# Patient Record
Sex: Male | Born: 1970 | Race: White | Hispanic: No | Marital: Married | Smoking: Current every day smoker
Health system: Southern US, Community
[De-identification: ages and names within clinical notes are randomized; demographics above are authoritative.]

## PROBLEM LIST (undated history)

## (undated) DIAGNOSIS — I10 Essential (primary) hypertension: Secondary | ICD-10-CM

## (undated) DIAGNOSIS — K219 Gastro-esophageal reflux disease without esophagitis: Secondary | ICD-10-CM

## (undated) DIAGNOSIS — R0600 Dyspnea, unspecified: Secondary | ICD-10-CM

## (undated) DIAGNOSIS — F419 Anxiety disorder, unspecified: Secondary | ICD-10-CM

## (undated) DIAGNOSIS — R519 Headache, unspecified: Secondary | ICD-10-CM

## (undated) DIAGNOSIS — N189 Chronic kidney disease, unspecified: Secondary | ICD-10-CM

## (undated) DIAGNOSIS — R51 Headache: Secondary | ICD-10-CM

## (undated) DIAGNOSIS — F17200 Nicotine dependence, unspecified, uncomplicated: Secondary | ICD-10-CM

## (undated) DIAGNOSIS — J45909 Unspecified asthma, uncomplicated: Secondary | ICD-10-CM

## (undated) DIAGNOSIS — I739 Peripheral vascular disease, unspecified: Secondary | ICD-10-CM

## (undated) DIAGNOSIS — E7800 Pure hypercholesterolemia, unspecified: Secondary | ICD-10-CM

## (undated) DIAGNOSIS — M1990 Unspecified osteoarthritis, unspecified site: Secondary | ICD-10-CM

## (undated) DIAGNOSIS — I82409 Acute embolism and thrombosis of unspecified deep veins of unspecified lower extremity: Secondary | ICD-10-CM

## (undated) HISTORY — PX: FRACTURE SURGERY: SHX138

---

## 2011-03-26 ENCOUNTER — Other Ambulatory Visit: Payer: Self-pay | Admitting: Urology

## 2011-03-26 DIAGNOSIS — Z87718 Personal history of other specified (corrected) congenital malformations of genitourinary system: Secondary | ICD-10-CM | POA: Insufficient documentation

## 2011-03-26 DIAGNOSIS — R350 Frequency of micturition: Secondary | ICD-10-CM | POA: Insufficient documentation

## 2011-03-26 DIAGNOSIS — Q613 Polycystic kidney, unspecified: Secondary | ICD-10-CM

## 2011-06-17 ENCOUNTER — Other Ambulatory Visit: Payer: Self-pay

## 2011-08-06 DIAGNOSIS — R300 Dysuria: Secondary | ICD-10-CM | POA: Insufficient documentation

## 2011-08-14 ENCOUNTER — Other Ambulatory Visit: Payer: Self-pay

## 2011-08-26 ENCOUNTER — Other Ambulatory Visit: Payer: Self-pay

## 2011-09-09 ENCOUNTER — Other Ambulatory Visit: Payer: Self-pay

## 2011-09-22 ENCOUNTER — Other Ambulatory Visit: Payer: Self-pay

## 2011-09-29 ENCOUNTER — Ambulatory Visit
Admission: RE | Admit: 2011-09-29 | Discharge: 2011-09-29 | Disposition: A | Discharge status: Home / Self Care | Payer: PRIVATE HEALTH INSURANCE | Source: Ambulatory Visit | Attending: Urology | Admitting: Urology

## 2011-09-29 DIAGNOSIS — Q613 Polycystic kidney, unspecified: Secondary | ICD-10-CM

## 2011-12-05 ENCOUNTER — Emergency Department: Payer: Self-pay | Admitting: Emergency Medicine

## 2012-04-29 ENCOUNTER — Other Ambulatory Visit: Payer: Self-pay | Admitting: Urology

## 2012-04-29 DIAGNOSIS — R208 Other disturbances of skin sensation: Secondary | ICD-10-CM

## 2012-04-29 DIAGNOSIS — Q613 Polycystic kidney, unspecified: Secondary | ICD-10-CM

## 2012-05-05 DIAGNOSIS — K219 Gastro-esophageal reflux disease without esophagitis: Secondary | ICD-10-CM | POA: Insufficient documentation

## 2012-05-11 ENCOUNTER — Other Ambulatory Visit: Payer: PRIVATE HEALTH INSURANCE

## 2012-05-21 ENCOUNTER — Other Ambulatory Visit: Payer: PRIVATE HEALTH INSURANCE

## 2012-05-28 ENCOUNTER — Other Ambulatory Visit: Payer: PRIVATE HEALTH INSURANCE

## 2012-06-18 ENCOUNTER — Inpatient Hospital Stay: Admission: RE | Admit: 2012-06-18 | Payer: PRIVATE HEALTH INSURANCE | Source: Ambulatory Visit

## 2012-06-25 ENCOUNTER — Ambulatory Visit
Admission: RE | Admit: 2012-06-25 | Discharge: 2012-06-25 | Disposition: A | Discharge status: Home / Self Care | Payer: No Typology Code available for payment source | Source: Ambulatory Visit | Attending: Urology | Admitting: Urology

## 2012-06-25 DIAGNOSIS — R208 Other disturbances of skin sensation: Secondary | ICD-10-CM

## 2012-06-25 DIAGNOSIS — Q613 Polycystic kidney, unspecified: Secondary | ICD-10-CM

## 2012-09-25 ENCOUNTER — Emergency Department: Payer: Self-pay | Admitting: Emergency Medicine

## 2012-11-01 DIAGNOSIS — N289 Disorder of kidney and ureter, unspecified: Secondary | ICD-10-CM | POA: Insufficient documentation

## 2012-11-01 DIAGNOSIS — R109 Unspecified abdominal pain: Secondary | ICD-10-CM | POA: Insufficient documentation

## 2012-11-01 DIAGNOSIS — Q613 Polycystic kidney, unspecified: Secondary | ICD-10-CM | POA: Insufficient documentation

## 2013-05-30 DIAGNOSIS — E785 Hyperlipidemia, unspecified: Secondary | ICD-10-CM | POA: Insufficient documentation

## 2013-06-06 DIAGNOSIS — M5416 Radiculopathy, lumbar region: Secondary | ICD-10-CM | POA: Insufficient documentation

## 2013-06-06 DIAGNOSIS — G8929 Other chronic pain: Secondary | ICD-10-CM | POA: Insufficient documentation

## 2013-07-20 ENCOUNTER — Emergency Department: Payer: Self-pay | Admitting: Emergency Medicine

## 2013-10-11 DIAGNOSIS — K4020 Bilateral inguinal hernia, without obstruction or gangrene, not specified as recurrent: Secondary | ICD-10-CM | POA: Insufficient documentation

## 2013-10-11 DIAGNOSIS — R339 Retention of urine, unspecified: Secondary | ICD-10-CM | POA: Insufficient documentation

## 2014-03-16 IMAGING — CT CT ABDOMEN W/O CM
3 of 4 series · 13 of 36 positions shown, 19 images · IV contrast (OMNI 300/WATER)
Comparison: Urinary tract ultrasound 09/29/2011.  No prior CT.

CLINICAL DATA: Increasing bilateral flank pain over the past 68
months in this patient with current history of polycystic kidney
disease.  Evaluate for possible hemorrhage into the cysts as the
cause for the pain.

CT ABDOMEN WITHOUT CONTRAST
TECHNIQUE: Multidetector CT imaging of the abdomen was performed
following the standard protocol without IV contrast.

[Series 3: abdomen w/o · axial · non-contrast · 0.82mm/px · z∈[-245,-20]mm · 6 of 64 slices shown, 11 images]
[im 10/64  soft-tissue]
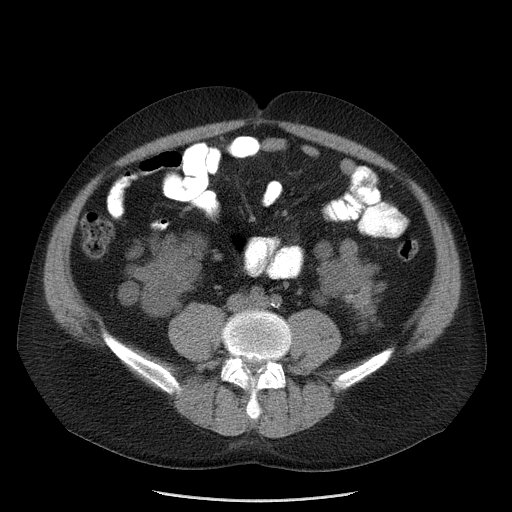
[im 10/64  bone]
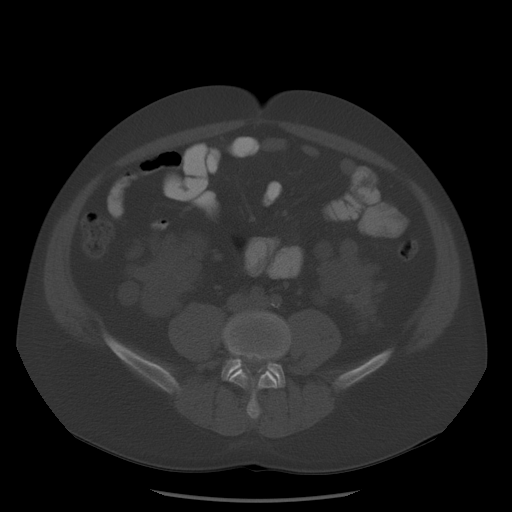
[im 19/64  soft-tissue]
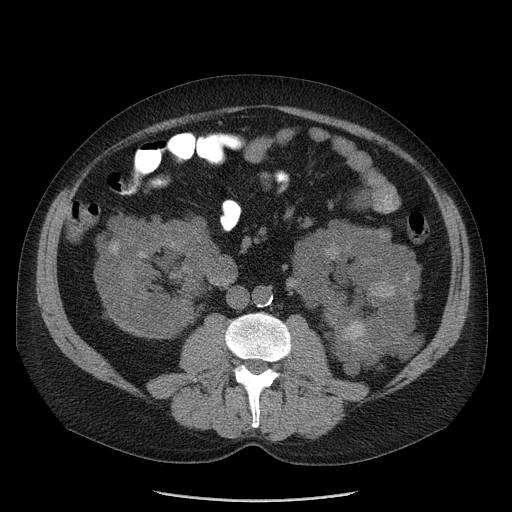
[im 28/64  soft-tissue]
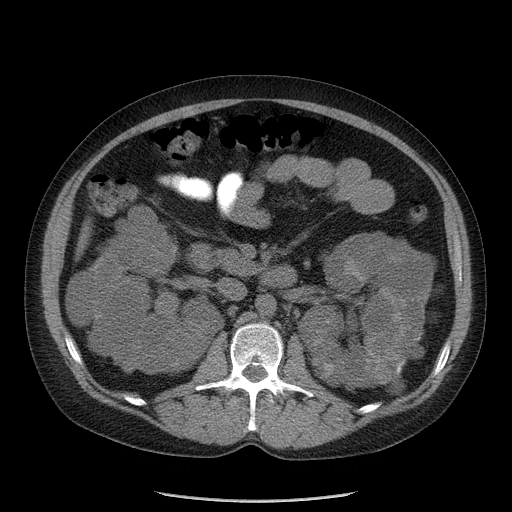
[im 28/64  lung]
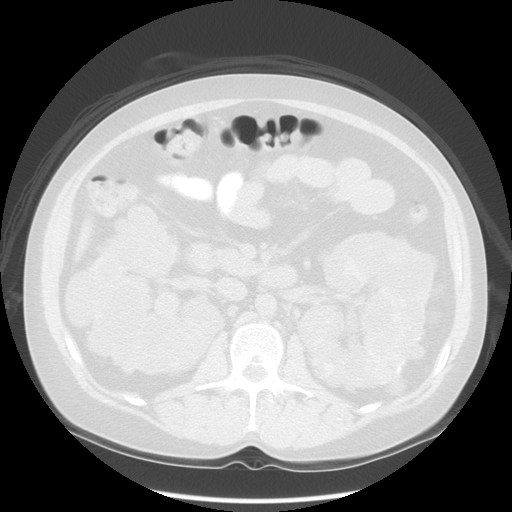
[im 37/64  soft-tissue]
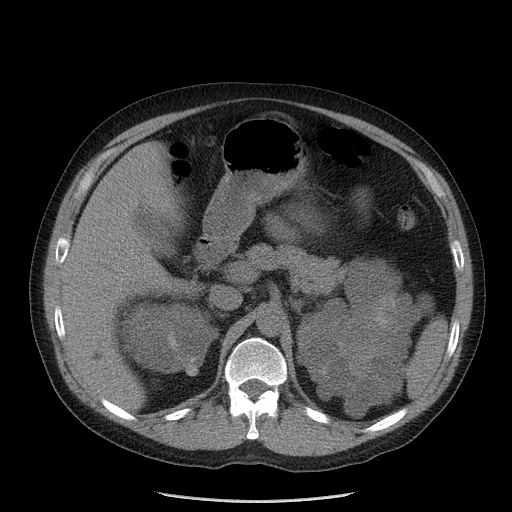
[im 37/64  lung]
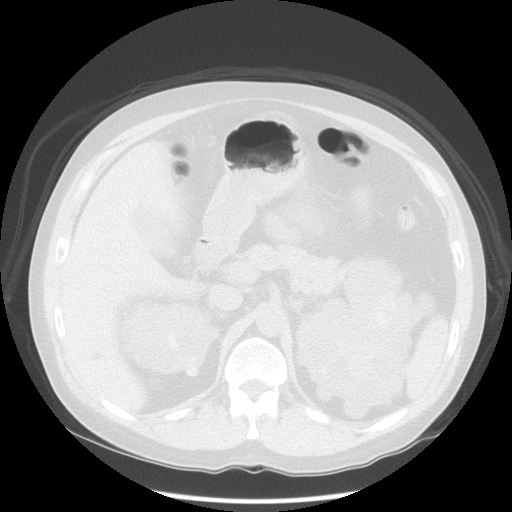
[im 46/64  soft-tissue]
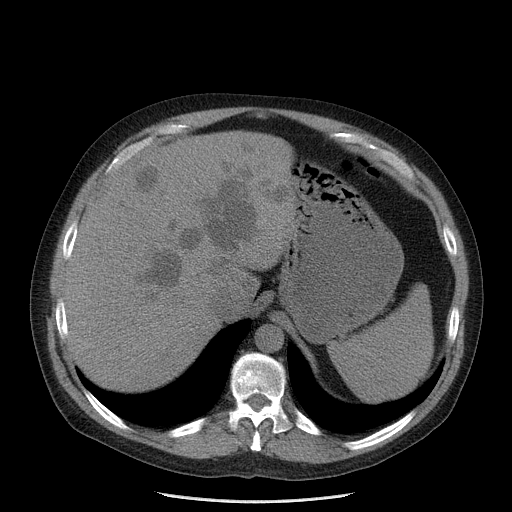
[im 46/64  lung]
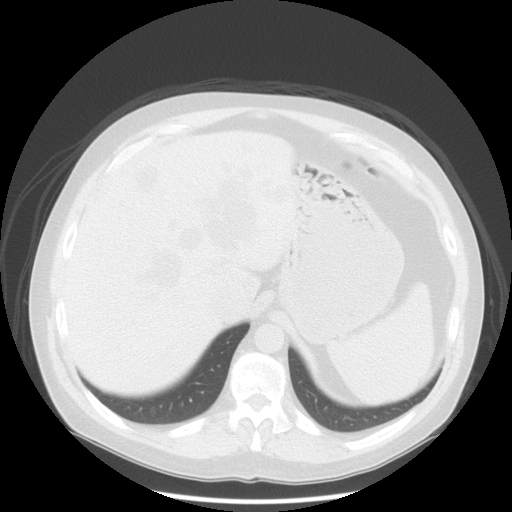
[im 55/64  soft-tissue]
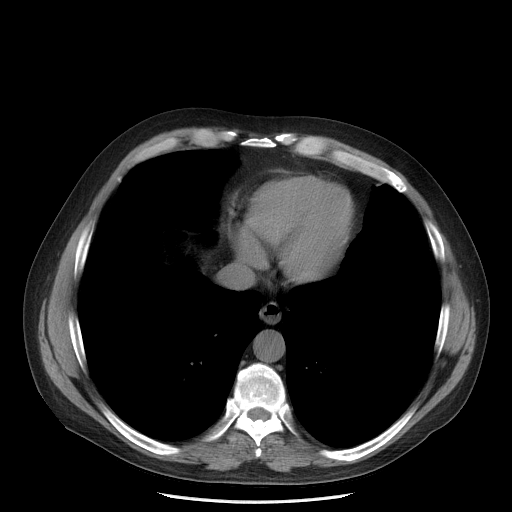
[im 55/64  lung]
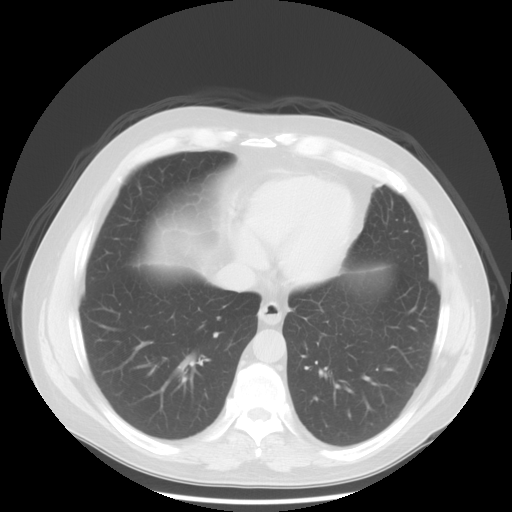

[Series 601: coronal body · coronal · 0.82mm/px · 1 of 138 slices shown, 2 images]
[im 46/138  soft-tissue]
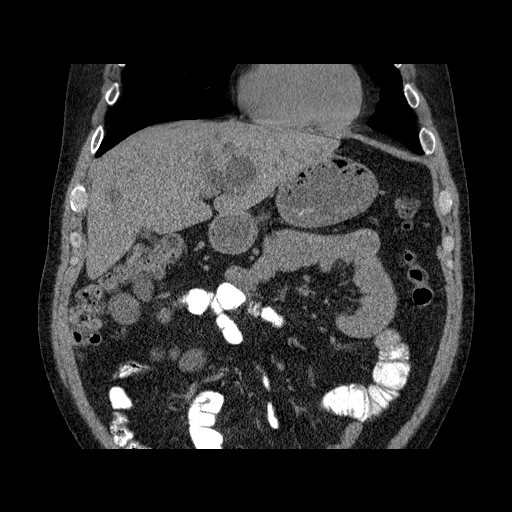
[im 46/138  bone]
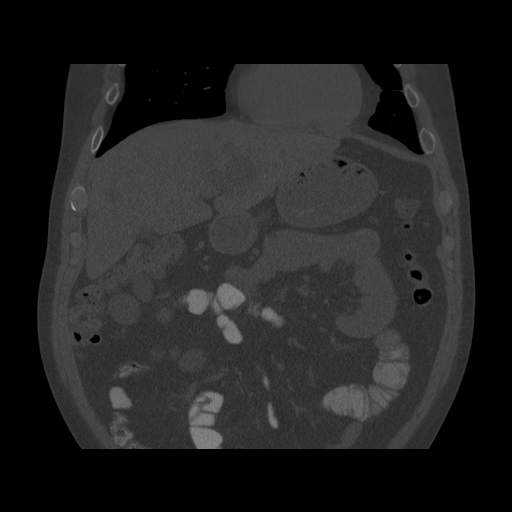

[Series 602: sagittal body · sagittal · 0.82mm/px · 6 of 161 slices shown]
[im 17/161  soft-tissue]
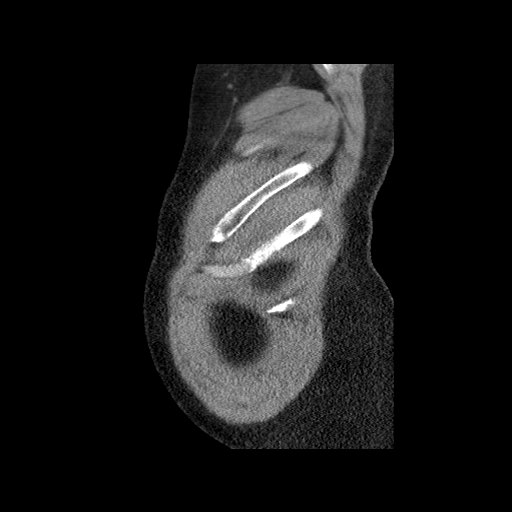
[im 34/161  soft-tissue]
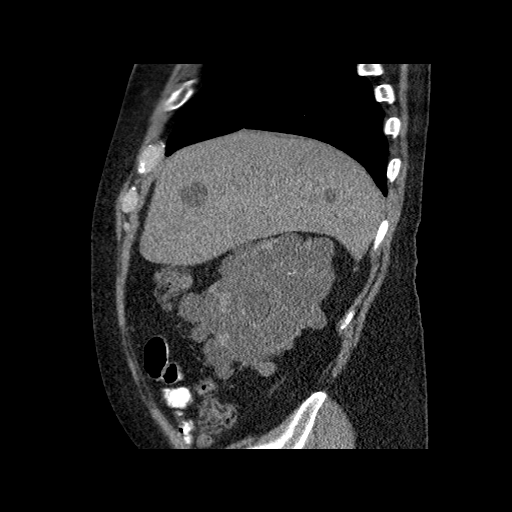
[im 51/161  soft-tissue]
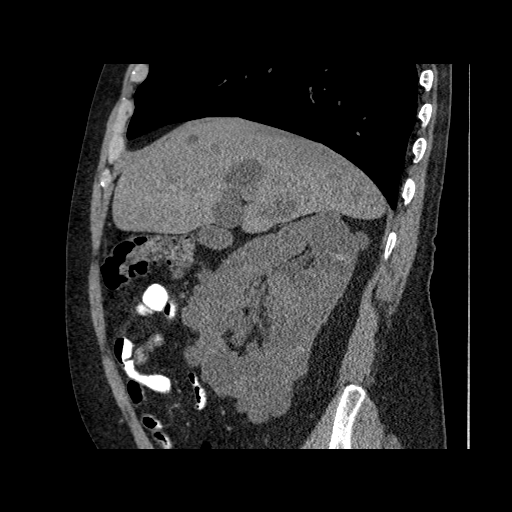
[im 68/161  soft-tissue]
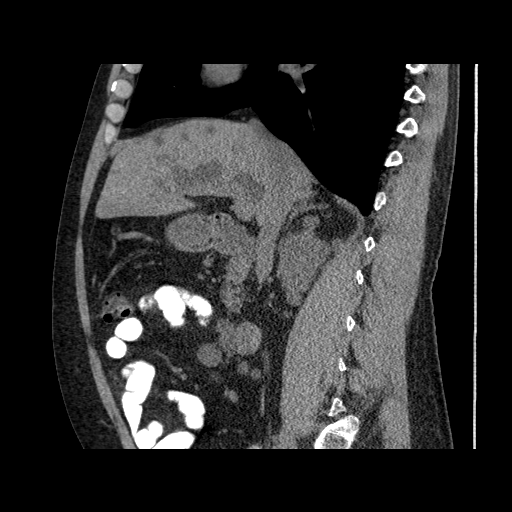
[im 93/161  soft-tissue]
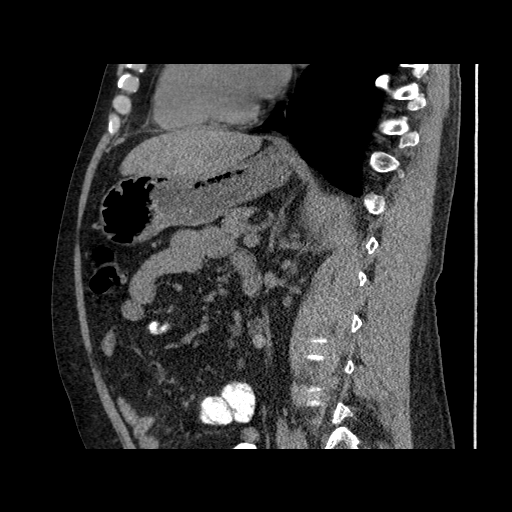
[im 110/161  soft-tissue]
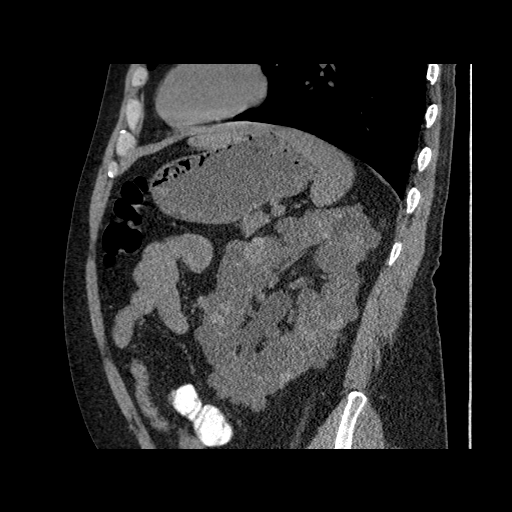

[13 of 36 positions shown; findings below may reference images not displayed]

FINDINGS: Innumerable cysts throughout both kidneys which are
markedly enlarged, consistent with the known history of polycystic
kidney disease.  Many of the cysts bilaterally are of increased
attenuation.  The largest high attenuation cyst is in the upper
pole of the left kidney and measures approximately 3 cm.  No
calculi identified within either kidney or either proximal ureter.

Numerous cysts are present throughout the liver; within the limits
of the unenhanced technique, no solid liver masses are identified.
Normal unenhanced appearance of the spleen, pancreas, adrenal
glands, and gallbladder.  No biliary ductal dilation.  Moderate
aorto-iliac atherosclerosis without aneurysm.  No significant
lymphadenopathy, though there are scattered normal-sized
retroperitoneal lymph nodes.

Stomach filled with food and fluid and normal in appearance.
Visualized colon and small bowel unremarkable.  No ascites.

Bone window images demonstrate minimal lower thoracic spondylosis.
Visualized lung bases clear.  Heart size normal.
IMPRESSION: 1.  Polycystic kidney disease.  Numerous proteinaceous and/or
hemorrhagic cysts are present in both kidneys, the largest
approximating 3 cm in the left upper pole.
2.  Numerous hepatic cysts.
3.  Aorto-iliac atherosclerosis which is advanced for age.

## 2014-05-15 DIAGNOSIS — I739 Peripheral vascular disease, unspecified: Secondary | ICD-10-CM | POA: Insufficient documentation

## 2014-05-31 DIAGNOSIS — M549 Dorsalgia, unspecified: Secondary | ICD-10-CM | POA: Insufficient documentation

## 2014-10-19 DIAGNOSIS — N185 Chronic kidney disease, stage 5: Secondary | ICD-10-CM | POA: Insufficient documentation

## 2015-07-26 HISTORY — PX: VASCULAR SURGERY: SHX849

## 2015-07-30 DIAGNOSIS — G894 Chronic pain syndrome: Secondary | ICD-10-CM | POA: Insufficient documentation

## 2015-10-24 DIAGNOSIS — Z01818 Encounter for other preprocedural examination: Secondary | ICD-10-CM | POA: Insufficient documentation

## 2015-11-06 ENCOUNTER — Other Ambulatory Visit: Payer: Self-pay | Admitting: Vascular Surgery

## 2015-11-16 ENCOUNTER — Other Ambulatory Visit: Payer: Self-pay

## 2015-11-20 ENCOUNTER — Encounter: Payer: Self-pay | Admitting: Anesthesiology

## 2015-11-20 ENCOUNTER — Inpatient Hospital Stay: Admission: RE | Admit: 2015-11-20 | Payer: Self-pay | Source: Ambulatory Visit

## 2015-11-21 ENCOUNTER — Ambulatory Visit: Admission: RE | Admit: 2015-11-21 | Payer: 59 | Source: Ambulatory Visit | Admitting: Vascular Surgery

## 2015-11-21 ENCOUNTER — Encounter: Admission: RE | Payer: Self-pay | Source: Ambulatory Visit

## 2015-11-21 SURGERY — ARTERIOVENOUS (AV) FISTULA CREATION
Anesthesia: General | Laterality: Right

## 2015-12-04 ENCOUNTER — Telehealth (INDEPENDENT_AMBULATORY_CARE_PROVIDER_SITE_OTHER): Payer: Self-pay

## 2015-12-04 ENCOUNTER — Encounter (INDEPENDENT_AMBULATORY_CARE_PROVIDER_SITE_OTHER): Payer: Self-pay

## 2015-12-04 NOTE — Telephone Encounter (Signed)
Called patient to let him know I had rescheduled him from 12/21/15 to 12/26/15 due to Dr. Delana Meyer being out of town on 12/21/15, patient was okay with the change.

## 2015-12-11 ENCOUNTER — Other Ambulatory Visit (INDEPENDENT_AMBULATORY_CARE_PROVIDER_SITE_OTHER): Payer: Self-pay | Admitting: Vascular Surgery

## 2015-12-11 ENCOUNTER — Ambulatory Visit (INDEPENDENT_AMBULATORY_CARE_PROVIDER_SITE_OTHER): Payer: 59 | Admitting: Vascular Surgery

## 2015-12-11 ENCOUNTER — Encounter (INDEPENDENT_AMBULATORY_CARE_PROVIDER_SITE_OTHER): Payer: Self-pay | Admitting: Vascular Surgery

## 2015-12-11 ENCOUNTER — Encounter
Admission: RE | Admit: 2015-12-11 | Discharge: 2015-12-11 | Disposition: A | Discharge status: Home / Self Care | Payer: 59 | Source: Ambulatory Visit | Attending: Vascular Surgery | Admitting: Vascular Surgery

## 2015-12-11 VITALS — BP 147/98 | HR 91 | Resp 17 | Ht 71.0 in | Wt 220.0 lb

## 2015-12-11 DIAGNOSIS — I10 Essential (primary) hypertension: Secondary | ICD-10-CM | POA: Diagnosis not present

## 2015-12-11 DIAGNOSIS — I120 Hypertensive chronic kidney disease with stage 5 chronic kidney disease or end stage renal disease: Secondary | ICD-10-CM | POA: Insufficient documentation

## 2015-12-11 DIAGNOSIS — N185 Chronic kidney disease, stage 5: Secondary | ICD-10-CM | POA: Diagnosis not present

## 2015-12-11 DIAGNOSIS — N186 End stage renal disease: Secondary | ICD-10-CM | POA: Diagnosis not present

## 2015-12-11 DIAGNOSIS — F17200 Nicotine dependence, unspecified, uncomplicated: Secondary | ICD-10-CM | POA: Insufficient documentation

## 2015-12-11 DIAGNOSIS — Z01818 Encounter for other preprocedural examination: Secondary | ICD-10-CM | POA: Insufficient documentation

## 2015-12-11 HISTORY — DX: Headache, unspecified: R51.9

## 2015-12-11 HISTORY — DX: Anxiety disorder, unspecified: F41.9

## 2015-12-11 HISTORY — DX: Gastro-esophageal reflux disease without esophagitis: K21.9

## 2015-12-11 HISTORY — DX: Peripheral vascular disease, unspecified: I73.9

## 2015-12-11 HISTORY — DX: Unspecified osteoarthritis, unspecified site: M19.90

## 2015-12-11 HISTORY — DX: Chronic kidney disease, unspecified: N18.9

## 2015-12-11 HISTORY — DX: Pure hypercholesterolemia, unspecified: E78.00

## 2015-12-11 HISTORY — DX: Unspecified asthma, uncomplicated: J45.909

## 2015-12-11 HISTORY — DX: Nicotine dependence, unspecified, uncomplicated: F17.200

## 2015-12-11 HISTORY — DX: Essential (primary) hypertension: I10

## 2015-12-11 HISTORY — DX: Headache: R51

## 2015-12-11 HISTORY — DX: Acute embolism and thrombosis of unspecified deep veins of unspecified lower extremity: I82.409

## 2015-12-11 HISTORY — DX: Dyspnea, unspecified: R06.00

## 2015-12-11 LAB — SURGICAL PCR SCREEN
MRSA, PCR: NEGATIVE
Staphylococcus aureus: NEGATIVE

## 2015-12-11 LAB — BASIC METABOLIC PANEL
ANION GAP: 6 (ref 5–15)
BUN: 39 mg/dL — ABNORMAL HIGH (ref 6–20)
CHLORIDE: 113 mmol/L — AB (ref 101–111)
CO2: 20 mmol/L — ABNORMAL LOW (ref 22–32)
Calcium: 8.4 mg/dL — ABNORMAL LOW (ref 8.9–10.3)
Creatinine, Ser: 4.32 mg/dL — ABNORMAL HIGH (ref 0.61–1.24)
GFR calc Af Amer: 17 mL/min — ABNORMAL LOW (ref >60)
GFR, EST NON AFRICAN AMERICAN: 15 mL/min — AB (ref >60)
GLUCOSE: 103 mg/dL — AB (ref 65–99)
POTASSIUM: 5.3 mmol/L — AB (ref 3.5–5.1)
Sodium: 139 mmol/L (ref 135–145)

## 2015-12-11 LAB — CBC WITH DIFFERENTIAL/PLATELET
BASOS ABS: 0 10*3/uL (ref 0–0.1)
Basophils Relative: 1 %
Eosinophils Absolute: 0.1 10*3/uL (ref 0–0.7)
Eosinophils Relative: 2 %
HEMATOCRIT: 32.2 % — AB (ref 40.0–52.0)
Hemoglobin: 11 g/dL — ABNORMAL LOW (ref 13.0–18.0)
LYMPHS ABS: 1.3 10*3/uL (ref 1.0–3.6)
LYMPHS PCT: 25 %
MCH: 30.5 pg (ref 26.0–34.0)
MCHC: 34.1 g/dL (ref 32.0–36.0)
MCV: 89.7 fL (ref 80.0–100.0)
MONO ABS: 0.3 10*3/uL (ref 0.2–1.0)
Monocytes Relative: 6 %
NEUTROS ABS: 3.5 10*3/uL (ref 1.4–6.5)
Neutrophils Relative %: 66 %
Platelets: 196 10*3/uL (ref 150–440)
RBC: 3.6 MIL/uL — AB (ref 4.40–5.90)
RDW: 13.3 % (ref 11.5–14.5)
WBC: 5.2 10*3/uL (ref 3.8–10.6)

## 2015-12-11 LAB — TYPE AND SCREEN
ABO/RH(D): O POS
ANTIBODY SCREEN: NEGATIVE

## 2015-12-11 LAB — APTT: APTT: 27 s (ref 24–36)

## 2015-12-11 LAB — PROTIME-INR
INR: 1.06
Prothrombin Time: 13.8 seconds (ref 11.4–15.2)

## 2015-12-11 NOTE — Patient Instructions (Addendum)
  Your procedure is scheduled [id number] 1, 2017 (Wednesday) Report to [facility] Day Surgery 2nd floor [facility] To find out your arrival time please call [phone] between 1PM - 3PM on December 25, 2015(Tuesday Remember: Instructions that are not followed completely may result in serious medical risk, up to and including death, or upon the discretion of your surgeon and anesthesiologist your surgery may need to be rescheduled.    _x___ 1. Do not eat food or drink liquids after midnight. No gum chewing or hard candies.     _x_   __ 2. No Alcohol for 24 hours before or after surgery.   __x__3. No Smoking for 24 prior to surgery.   ____  4. Bring all medications with you on the day of surgery if instructed.    __x__ 5. Notify your doctor if there is any change in your medical condition     (cold, fever, infections).     Do not wear jewelry, make-up, hairpins, clips or nail polish.  Do not wear lotions, powders, or perfumes. You may wear deodorant.  Do not shave 48 hours prior to surgery. Men may shave face and neck.  Do not bring valuables to the hospital.    [facility] is not responsible for any belongings or valuables.               Contacts, dentures or bridgework may not be worn into surgery.  Leave your suitcase in the car. After surgery it may be brought to your room.  For patients admitted to the hospital, discharge time is determined by your treatment team.   Patients discharged the day of surgery will not be allowed to drive home.    Please read over the following fact sheets that you were given:   [facility] Preparing for Surgery and or MRSA Information   _x___ Take these medicines the morning of surgery with A SIP OF WATER:    1. Amlodipine   2. Lisinopril.  3. Omeprazole (Omeprazole at bedtime on October 31)    ____Fleets enema or Magnesium Citrate as directed.   _x___ Use CHG Soap or sage wipes as directed on instruction sheet   ____ Use inhalers on the day  of surgery and bring to hospital day of surgery  ____ Stop metformin 2 days prior to surgery    ____ Take 1/2 of usual insulin dose the night before surgery and none on the morning of           surgery.    _X__ Stop aspirin or coumadin, or plavix (Continue Plavix and Aspirin, but do not take the morning of surgery )  x__ Stop Anti-inflammatories such as Advil, Aleve, Ibuprofen, Motrin, Naproxen,          Naprosyn, Goodies powders or aspirin products. Ok to take Tylenol.   ____ Stop supplements until after surgery.    ____ Bring C-Pap to the hospital.

## 2015-12-11 NOTE — Progress Notes (Signed)
Subjective:    Patient ID: Oscar Moody., male    DOB: September 07, 1970, 45 y.o.   MRN: [id number] Chief Complaint  Patient presents with  . Re-evaluation    Update H and P    Patient presents for an H&P update. He is scheduled for a right radiocephalic AV fistula creation. He denies any new health issues or changes in his medications. He denies any recent sicknesses. He denies any chest pain, SOB, abdominal pain or changes in urinary or bowel habits.    Review of Systems  Constitutional: Negative.   HENT: Negative.   Eyes: Negative.   Respiratory: Negative.   Cardiovascular: Negative.   Gastrointestinal: Negative.   Endocrine: Negative.   Genitourinary:       Chronic Kidney Disease  Musculoskeletal: Negative.   Skin: Negative.   Allergic/Immunologic: Negative.   Neurological: Negative.   Hematological: Negative.   Psychiatric/Behavioral: Negative.       Objective:   Physical Exam  Constitutional: He is oriented to person, place, and time. He appears well-developed and well-nourished.  HENT:  Head: Normocephalic and atraumatic.  Eyes: Conjunctivae and EOM are normal. Pupils are equal, round, and reactive to light.  Neck: Normal range of motion.  Cardiovascular: Normal rate, regular rhythm, normal heart sounds and intact distal pulses.   Pulses:      Dorsalis pedis pulses are 2+ on the right side, and 2+ on the left side.       Posterior tibial pulses are 2+ on the right side, and 2+ on the left side.  Pulmonary/Chest: Effort normal and breath sounds normal. No respiratory distress. He has no wheezes. He has no rales.  Abdominal: Soft. Bowel sounds are normal. He exhibits no distension. There is no tenderness. There is no rebound and no guarding.  Musculoskeletal: Normal range of motion. He exhibits no edema.  Neurological: He is alert and oriented to person, place, and time.  Skin: Skin is warm and dry.  Psychiatric: He has a normal mood and affect. His behavior is  normal. Judgment and thought content normal.   BP (!) 147/98 (BP Location: Right Arm)   Pulse 91   Resp 17   Ht 5\' 11"  (1.803 m)   Wt 220 lb (99.8 kg)   BMI 30.68 kg/m   Past Medical History:  Diagnosis Date  . Anxiety   . Arthritis   . Asthma    childhood asthma  . Chronic kidney disease   . Current every day smoker   . DVT (deep venous thrombosis) ([place])   . Dyspnea    with exertion  . GERD (gastroesophageal reflux disease)   . Headache   . Hypercholesteremia   . Hypertension   . Peripheral vascular disease [facility])    Social History   Social History  . Marital status: Single    Spouse name: N/A  . Number of children: N/A  . Years of education: N/A   Occupational History  . Not on file.   Social History Main Topics  . Smoking status: Current Every Day Smoker    Packs/day: 0.25    Types: Cigarettes  . Smokeless tobacco: Never Used  . Alcohol use No  . Drug use:     Types: Marijuana     Comment: occassional (when out of pain medication)  . Sexual activity: Not on file   Other Topics Concern  . Not on file   Social History Narrative  . No narrative on file   Past Surgical  History:  Procedure Laterality Date  . FRACTURE SURGERY Left    screw in left wrist  . VASCULAR SURGERY Left 07/2015   stent in left thigh, Location:[place]   Family History  Problem Relation Age of Onset  . Hypertension Mother   . Hypertension Sister    No Known Allergies     Assessment & Plan:  Patient presents for an H&P update. He is scheduled for a right radiocephalic AV fistula creation. He denies any new health issues or changes in his medications. He denies any recent sicknesses. He denies any chest pain, SOB, abdominal pain or changes in urinary or bowel habits.   1. Chronic kidney disease, stage V ([place]) - Stable He is scheduled for a right radiocephalic AV fistula creation.  2. Essential hypertension - Stable Encouraged good control as its slows the progression of  atherosclerotic disease  3. Tobacco dependence - Stable I have discussed with the patient the role of tobacco in the pathogenesis of atherosclerosis and its effect on the progression of the disease, impact on the durability of interventions and its limitations on the formation of collateral pathways. I have recommended absolute tobacco cessation. I have discussed various options available for assistance with tobacco cessation including over the counter methods (Nicotine gum, patch and lozenges). We also discussed prescription options (Chantix, Nicotine Inhaler / Nasal Spray). The patient is not interested in pursuing any prescription tobacco cessation options at this time. The patient voices their understanding.   Current Outpatient Prescriptions on File Prior to Visit  Medication Sig Dispense Refill  . amLODipine (NORVASC) 5 MG tablet Take 5 mg by mouth daily.    Marland Kitchen aspirin EC 81 MG tablet Take 81 mg by mouth daily.    . clopidogrel (PLAVIX) 75 MG tablet Take 75 mg by mouth daily.    Marland Kitchen lisinopril (PRINIVIL,ZESTRIL) 20 MG tablet Take 20 mg by mouth daily.    Marland Kitchen omeprazole (PRILOSEC) 40 MG capsule Take 40 mg by mouth daily.     No current facility-administered medications on file prior to visit.     There are no Patient Instructions on file for this visit. No Follow-up on file.   Abri Vacca A Willi Borowiak, PA-C

## 2015-12-26 ENCOUNTER — Encounter: Payer: Self-pay | Admitting: *Deleted

## 2015-12-26 ENCOUNTER — Ambulatory Visit: Payer: 59 | Admitting: Anesthesiology

## 2015-12-26 ENCOUNTER — Encounter
Admission: RE | Disposition: A | Discharge status: Home / Self Care | Payer: Self-pay | Source: Ambulatory Visit | Attending: Vascular Surgery

## 2015-12-26 ENCOUNTER — Ambulatory Visit
Admission: RE | Admit: 2015-12-26 | Discharge: 2015-12-26 | Disposition: A | Discharge status: Home / Self Care | Payer: 59 | Source: Ambulatory Visit | Attending: Vascular Surgery | Admitting: Vascular Surgery

## 2015-12-26 DIAGNOSIS — J449 Chronic obstructive pulmonary disease, unspecified: Secondary | ICD-10-CM | POA: Insufficient documentation

## 2015-12-26 DIAGNOSIS — E7800 Pure hypercholesterolemia, unspecified: Secondary | ICD-10-CM | POA: Diagnosis not present

## 2015-12-26 DIAGNOSIS — F419 Anxiety disorder, unspecified: Secondary | ICD-10-CM | POA: Diagnosis not present

## 2015-12-26 DIAGNOSIS — I739 Peripheral vascular disease, unspecified: Secondary | ICD-10-CM | POA: Diagnosis not present

## 2015-12-26 DIAGNOSIS — F17210 Nicotine dependence, cigarettes, uncomplicated: Secondary | ICD-10-CM | POA: Diagnosis not present

## 2015-12-26 DIAGNOSIS — N186 End stage renal disease: Secondary | ICD-10-CM | POA: Diagnosis not present

## 2015-12-26 DIAGNOSIS — Z6830 Body mass index (BMI) 30.0-30.9, adult: Secondary | ICD-10-CM | POA: Insufficient documentation

## 2015-12-26 DIAGNOSIS — I120 Hypertensive chronic kidney disease with stage 5 chronic kidney disease or end stage renal disease: Secondary | ICD-10-CM | POA: Diagnosis present

## 2015-12-26 DIAGNOSIS — Z7902 Long term (current) use of antithrombotics/antiplatelets: Secondary | ICD-10-CM | POA: Insufficient documentation

## 2015-12-26 DIAGNOSIS — Z7982 Long term (current) use of aspirin: Secondary | ICD-10-CM | POA: Diagnosis not present

## 2015-12-26 DIAGNOSIS — M1990 Unspecified osteoarthritis, unspecified site: Secondary | ICD-10-CM | POA: Insufficient documentation

## 2015-12-26 DIAGNOSIS — K219 Gastro-esophageal reflux disease without esophagitis: Secondary | ICD-10-CM | POA: Diagnosis not present

## 2015-12-26 DIAGNOSIS — Z86718 Personal history of other venous thrombosis and embolism: Secondary | ICD-10-CM | POA: Diagnosis not present

## 2015-12-26 HISTORY — PX: AV FISTULA PLACEMENT: SHX1204

## 2015-12-26 LAB — POCT I-STAT 4, (NA,K, GLUC, HGB,HCT)
GLUCOSE: 96 mg/dL (ref 65–99)
HEMATOCRIT: 35 % — AB (ref 39.0–52.0)
Hemoglobin: 11.9 g/dL — ABNORMAL LOW (ref 13.0–17.0)
POTASSIUM: 4.5 mmol/L (ref 3.5–5.1)
Sodium: 143 mmol/L (ref 135–145)

## 2015-12-26 LAB — TYPE AND SCREEN
ABO/RH(D): O POS
ANTIBODY SCREEN: NEGATIVE

## 2015-12-26 LAB — URINE DRUG SCREEN, QUALITATIVE (ARMC ONLY)
AMPHETAMINES, UR SCREEN: NOT DETECTED
BENZODIAZEPINE, UR SCRN: NOT DETECTED
Barbiturates, Ur Screen: NOT DETECTED
Cannabinoid 50 Ng, Ur ~~LOC~~: NOT DETECTED
Cocaine Metabolite,Ur ~~LOC~~: NOT DETECTED
MDMA (ECSTASY) UR SCREEN: NOT DETECTED
METHADONE SCREEN, URINE: NOT DETECTED
Opiate, Ur Screen: NOT DETECTED
PHENCYCLIDINE (PCP) UR S: NOT DETECTED
TRICYCLIC, UR SCREEN: NOT DETECTED

## 2015-12-26 SURGERY — ARTERIOVENOUS (AV) FISTULA CREATION
Anesthesia: General | Laterality: Right

## 2015-12-26 MED ORDER — ONDANSETRON HCL 4 MG/2ML IJ SOLN
INTRAMUSCULAR | Status: DC | PRN
  Administered 2015-12-26: 4 mg via INTRAVENOUS

## 2015-12-26 MED ORDER — HYDROCODONE-ACETAMINOPHEN 5-325 MG PO TABS: 1.0000 | ORAL_TABLET | Freq: Four times a day (QID) | ORAL | 0 refills | Status: DC | PRN

## 2015-12-26 MED ORDER — SODIUM CHLORIDE 0.9 % IV SOLN
INTRAVENOUS | Status: DC
  Administered 2015-12-26: 13:00:00 via INTRAVENOUS

## 2015-12-26 MED ORDER — PHENYLEPHRINE HCL 10 MG/ML IJ SOLN
INTRAMUSCULAR | Status: DC | PRN
  Administered 2015-12-26 (×2): 100 ug via INTRAVENOUS
  Administered 2015-12-26 (×2): 200 ug via INTRAVENOUS
  Administered 2015-12-26 (×2): 100 ug via INTRAVENOUS
  Administered 2015-12-26 (×5): 200 ug via INTRAVENOUS

## 2015-12-26 MED ORDER — PAPAVERINE HCL 30 MG/ML IJ SOLN
INTRAMUSCULAR | Status: AC
  Filled 2015-12-26: qty 2

## 2015-12-26 MED ORDER — FENTANYL CITRATE (PF) 100 MCG/2ML IJ SOLN
INTRAMUSCULAR | Status: DC | PRN
  Administered 2015-12-26 (×2): 50 ug via INTRAVENOUS
  Administered 2015-12-26: 25 ug via INTRAVENOUS
  Administered 2015-12-26: 50 ug via INTRAVENOUS
  Administered 2015-12-26: 25 ug via INTRAVENOUS

## 2015-12-26 MED ORDER — PROPOFOL 10 MG/ML IV BOLUS
INTRAVENOUS | Status: DC | PRN
  Administered 2015-12-26: 200 mg via INTRAVENOUS

## 2015-12-26 MED ORDER — CEFAZOLIN SODIUM-DEXTROSE 2-4 GM/100ML-% IV SOLN
INTRAVENOUS | Status: AC
  Administered 2015-12-26: 2 g via INTRAVENOUS
  Filled 2015-12-26: qty 100

## 2015-12-26 MED ORDER — CHLORHEXIDINE GLUCONATE CLOTH 2 % EX PADS: 6.0000 | MEDICATED_PAD | Freq: Once | CUTANEOUS | Status: DC

## 2015-12-26 MED ORDER — GLYCOPYRROLATE 0.2 MG/ML IJ SOLN
INTRAMUSCULAR | Status: DC | PRN
  Administered 2015-12-26: 0.2 mg via INTRAVENOUS

## 2015-12-26 MED ORDER — HEPARIN SODIUM (PORCINE) 5000 UNIT/ML IJ SOLN
INTRAMUSCULAR | Status: AC
  Filled 2015-12-26: qty 1

## 2015-12-26 MED ORDER — BUPIVACAINE HCL (PF) 0.5 % IJ SOLN
INTRAMUSCULAR | Status: DC | PRN
  Administered 2015-12-26: 4 mL

## 2015-12-26 MED ORDER — LIDOCAINE HCL (CARDIAC) 20 MG/ML IV SOLN
INTRAVENOUS | Status: DC | PRN
  Administered 2015-12-26: 100 mg via INTRAVENOUS

## 2015-12-26 MED ORDER — SODIUM CHLORIDE 0.9 % IV SOLN
INTRAVENOUS | Status: DC | PRN
  Administered 2015-12-26: 20 mL via INTRAMUSCULAR

## 2015-12-26 MED ORDER — FENTANYL CITRATE (PF) 100 MCG/2ML IJ SOLN
25.0000 ug | INTRAMUSCULAR | Status: DC | PRN
  Administered 2015-12-26 (×4): 25 ug via INTRAVENOUS

## 2015-12-26 MED ORDER — MIDAZOLAM HCL 2 MG/2ML IJ SOLN
INTRAMUSCULAR | Status: DC | PRN
  Administered 2015-12-26: 2 mg via INTRAVENOUS

## 2015-12-26 MED ORDER — FENTANYL CITRATE (PF) 100 MCG/2ML IJ SOLN
INTRAMUSCULAR | Status: AC
  Administered 2015-12-26: 25 ug via INTRAVENOUS
  Filled 2015-12-26: qty 2

## 2015-12-26 MED ORDER — CEFAZOLIN SODIUM-DEXTROSE 2-4 GM/100ML-% IV SOLN
2.0000 g | INTRAVENOUS | Status: AC
  Administered 2015-12-26: 2 g via INTRAVENOUS

## 2015-12-26 MED ORDER — BUPIVACAINE HCL (PF) 0.5 % IJ SOLN
INTRAMUSCULAR | Status: AC
  Filled 2015-12-26: qty 30

## 2015-12-26 MED ORDER — ONDANSETRON HCL 4 MG/2ML IJ SOLN: 4.0000 mg | Freq: Once | INTRAMUSCULAR | Status: DC | PRN

## 2015-12-26 SURGICAL SUPPLY — 58 items
APPLIER CLIP 11 MED OPEN (CLIP)
APPLIER CLIP 9.375 SM OPEN (CLIP)
BAG DECANTER FOR FLEXI CONT (MISCELLANEOUS) ×3 IMPLANT
BLADE CLIPPER SURG (BLADE) ×3 IMPLANT
BLADE SURG SZ11 CARB STEEL (BLADE) ×3 IMPLANT
BOOT SUTURE AID YELLOW STND (SUTURE) ×3 IMPLANT
BRUSH SCRUB 4% CHG (MISCELLANEOUS) ×3 IMPLANT
CANISTER SUCT 1200ML W/VALVE (MISCELLANEOUS) ×3 IMPLANT
CHLORAPREP W/TINT 26ML (MISCELLANEOUS) ×6 IMPLANT
CLIP APPLIE 11 MED OPEN (CLIP) IMPLANT
CLIP APPLIE 9.375 SM OPEN (CLIP) IMPLANT
DRESSING SURGICEL FIBRLLR 1X2 (HEMOSTASIS) ×1 IMPLANT
DRSG SURGICEL FIBRILLAR 1X2 (HEMOSTASIS) ×3
ELECT CAUTERY BLADE 6.4 (BLADE) ×3 IMPLANT
ELECT REM PT RETURN 9FT ADLT (ELECTROSURGICAL) ×3
ELECTRODE REM PT RTRN 9FT ADLT (ELECTROSURGICAL) ×1 IMPLANT
GEL ULTRASOUND 20GR AQUASONIC (MISCELLANEOUS) IMPLANT
GLOVE BIO SURGEON STRL SZ7 (GLOVE) ×3 IMPLANT
GLOVE BIOGEL PI IND STRL 7.0 (GLOVE) ×1 IMPLANT
GLOVE BIOGEL PI INDICATOR 7.0 (GLOVE) ×2
GLOVE INDICATOR 7.5 STRL GRN (GLOVE) IMPLANT
GLOVE SURG SYN 8.0 (GLOVE) ×3 IMPLANT
GOWN STRL REUS W/ TWL LRG LVL3 (GOWN DISPOSABLE) ×2 IMPLANT
GOWN STRL REUS W/ TWL XL LVL3 (GOWN DISPOSABLE) ×1 IMPLANT
GOWN STRL REUS W/TWL LRG LVL3 (GOWN DISPOSABLE) ×4
GOWN STRL REUS W/TWL MED LVL3 (GOWN DISPOSABLE) ×3 IMPLANT
GOWN STRL REUS W/TWL XL LVL3 (GOWN DISPOSABLE) ×2
IV NS 500ML (IV SOLUTION) ×2
IV NS 500ML BAXH (IV SOLUTION) ×1 IMPLANT
KIT RM TURNOVER STRD PROC AR (KITS) ×3 IMPLANT
LABEL OR SOLS (LABEL) ×3 IMPLANT
LIQUID BAND (GAUZE/BANDAGES/DRESSINGS) ×3 IMPLANT
LOOP RED MAXI  1X406MM (MISCELLANEOUS) ×2
LOOP VESSEL MAXI 1X406 RED (MISCELLANEOUS) ×1 IMPLANT
LOOP VESSEL MINI 0.8X406 BLUE (MISCELLANEOUS) ×1 IMPLANT
LOOPS BLUE MINI 0.8X406MM (MISCELLANEOUS) ×2
NEEDLE FILTER BLUNT 18X 1/2SAF (NEEDLE) ×2
NEEDLE FILTER BLUNT 18X1 1/2 (NEEDLE) ×1 IMPLANT
NEEDLE HYPO 30X.5 LL (NEEDLE) IMPLANT
NS IRRIG 500ML POUR BTL (IV SOLUTION) IMPLANT
PACK EXTREMITY ARMC (MISCELLANEOUS) ×3 IMPLANT
PAD PREP 24X41 OB/GYN DISP (PERSONAL CARE ITEMS) ×3 IMPLANT
PUNCH SURGICAL ROTATE 2.7MM (MISCELLANEOUS) IMPLANT
STOCKINETTE STRL 4IN 9604848 (GAUZE/BANDAGES/DRESSINGS) ×3 IMPLANT
SUT MNCRL+ 5-0 UNDYED PC-3 (SUTURE) ×1 IMPLANT
SUT MONOCRYL 5-0 (SUTURE) ×2
SUT PROLENE 6 0 BV (SUTURE) ×9 IMPLANT
SUT SILK 2 0 (SUTURE) ×2
SUT SILK 2-0 18XBRD TIE 12 (SUTURE) ×1 IMPLANT
SUT SILK 3 0 (SUTURE) ×2
SUT SILK 3-0 18XBRD TIE 12 (SUTURE) ×1 IMPLANT
SUT SILK 4 0 (SUTURE) ×2
SUT SILK 4-0 18XBRD TIE 12 (SUTURE) ×1 IMPLANT
SUT VIC AB 3-0 SH 27 (SUTURE) ×2
SUT VIC AB 3-0 SH 27X BRD (SUTURE) ×1 IMPLANT
SYR 20CC LL (SYRINGE) ×3 IMPLANT
SYR 3ML LL SCALE MARK (SYRINGE) ×3 IMPLANT
TOWEL OR 17X26 4PK STRL BLUE (TOWEL DISPOSABLE) IMPLANT

## 2015-12-26 NOTE — Op Note (Signed)
     OPERATIVE NOTE   PROCEDURE: right radiocephalic arteriovenous fistula placement  PRE-OPERATIVE DIAGNOSIS: End Stage Renal Disease  POST-OPERATIVE DIAGNOSIS: End Stage Renal Disease  SURGEON: Hortencia Pilar  ASSISTANT(S): None  ANESTHESIA: general  ESTIMATED BLOOD LOSS: <50 cc  FINDING(S): 2.5 mm cephalic vein at the wrist  SPECIMEN(S):  none  INDICATIONS:   Oscar Moody. is a 45 y.o. male who presents with end stage renal disease.  The patient is scheduled for right radiocephalic arteriovenous fistula placement.  The patient is aware the risks include but are not limited to: bleeding, infection, steal syndrome, nerve damage, ischemic monomelic neuropathy, failure to mature, and need for additional procedures.  The patient is aware of the risks of the procedure and elects to proceed forward.  DESCRIPTION: After full informed written consent was obtained from the patient, the patient was brought back to the operating room and placed supine upon the operating table.  Prior to induction, the patient received IV antibiotics.   After obtaining adequate anesthesia, the patient was then prepped and draped in the standard fashion for a right arm access procedure.    A linear incision was then created midway between the radial impulse and the cephalic vein. The cephalic vein was then identified and dissected circumferentially. It was marked with a surgical marker.    Attention was then turned to the radial artery which was exposed through the same incision and looped proximally and distally. Side branches were controlled with 4-0 silk ties.  The distal segment of the vein was ligated with a  2-0 silk, and the vein was transected.  The proximal segment was interrogated with serial dilators.  The vein accepted up to a 2.5 mm dilator without any difficulty. Heparinized saline was infused into the vein and clamped it with a small bulldog.  At this point, I reset my exposure of the  brachial artery and controlled the artery with vessel loops proximally and distally.  An arteriotomy was then made with a #11 blade, and extended with a Potts scissor.  Heparinized saline was injected proximal and distal into the radial artery.  The vein was then approximated to the artery while the artery was in its native bed and subsequently the vein was beveled using Potts scissors. The vein was then sewn to the artery in an end-to-side configuration with a running stitch of 6-0 Prolene.  Prior to completing this anastomosis Flushing maneuvers were performed and the artery was allowed to forward and back bleed.  There was no evidence of clot from any vessels.  I completed the anastomosis in the usual fashion and then released all vessel loops and clamps.    There was good  thrill in the venous outflow, and there was 1+ palpable radial pulse.  At this point, I irrigated out the surgical wound.  There was no further active bleeding.  The subcutaneous tissue was reapproximated with a running stitch of 3-0 Vicryl.  The skin was then reapproximated with a running subcuticular stitch of 4-0 Vicryl.  The skin was then cleaned, dried, and reinforced with Dermabond.    The patient tolerated this procedure well.   COMPLICATIONS: None  CONDITION: Oscar Moody  [facility]  Office: [phone]   12/26/2015, 4:37 PM

## 2015-12-26 NOTE — H&P (Signed)
[facility] History & Physical Update  The patient was interviewed and re-examined.  The patient's previous History and Physical has been reviewed and is unchanged.  There is no change in the plan of care. We plan to proceed with the scheduled procedure.  Hortencia Pilar, MD  12/26/2015, 12:16 PM

## 2015-12-26 NOTE — Transfer of Care (Signed)
Immediate Anesthesia Transfer of Care Note  Patient: Oscar Moody.  Procedure(s) Performed: Procedure(s): ARTERIOVENOUS (AV) FISTULA CREATION ( RADIAL CEPHALIC ) (Right)  Patient Location: PACU  Anesthesia Type:General  Level of Consciousness: awake, alert  and oriented  Airway & Oxygen Therapy: Patient Spontanous Breathing and Patient connected to face mask oxygen  Post-op Assessment: Report given to RN and Post -op Vital signs reviewed and stable  Post vital signs: Reviewed and stable  Last Vitals:  Vitals:   12/26/15 1203  BP: (!) 134/102  Pulse: 87  Resp: 20  Temp: 36.4 C    Last Pain:  Vitals:   12/26/15 1203  TempSrc: Oral  PainSc: 8       Patients Stated Pain Goal: 2 (16/10/96 0454)  Complications: No apparent anesthesia complications

## 2015-12-26 NOTE — Anesthesia Postprocedure Evaluation (Signed)
Anesthesia Post Note  Patient: Oscar Moody.  Procedure(s) Performed: Procedure(s) (LRB): ARTERIOVENOUS (AV) FISTULA CREATION ( RADIAL CEPHALIC ) (Right)  Patient location during evaluation: PACU Anesthesia Type: General Level of consciousness: awake and alert Pain management: pain level controlled Vital Signs Assessment: post-procedure vital signs reviewed and stable Respiratory status: spontaneous breathing, nonlabored ventilation, respiratory function stable and patient connected to nasal cannula oxygen Cardiovascular status: blood pressure returned to baseline and stable Postop Assessment: no signs of nausea or vomiting Anesthetic complications: no    Last Vitals:  Vitals:   12/26/15 1733 12/26/15 1746  BP:  115/77  Pulse: 85 91  Resp: 12 14  Temp:  36.7 C    Last Pain:  Vitals:   12/26/15 1746  TempSrc:   PainSc: 3                  Martha Clan

## 2015-12-26 NOTE — Anesthesia Preprocedure Evaluation (Signed)
Anesthesia Evaluation  Patient identified by MRN, date of birth, ID band Patient awake    Reviewed: Allergy & Precautions, NPO status , Patient's Chart, lab work & pertinent test results  Airway Mallampati: II       Dental  (+) Teeth Intact   Pulmonary COPD, Current Smoker,     + decreased breath sounds      Cardiovascular hypertension, Pt. on medications  Rhythm:Regular Rate:Normal     Neuro/Psych  Headaches, Anxiety    GI/Hepatic Neg liver ROS, GERD  Medicated,  Endo/Other  Morbid obesity  Renal/GU CRFRenal diseasenegative Renal ROS     Musculoskeletal   Abdominal (+) + obese,   Peds  Hematology negative hematology ROS (+)   Anesthesia Other Findings   Reproductive/Obstetrics                             Anesthesia Physical Anesthesia Plan  ASA: III  Anesthesia Plan: General   Post-op Pain Management:    Induction: Intravenous  Airway Management Planned: LMA and Oral ETT  Additional Equipment:   Intra-op Plan:   Post-operative Plan: Extubation in OR  Informed Consent: I have reviewed the patients History and Physical, chart, labs and discussed the procedure including the risks, benefits and alternatives for the proposed anesthesia with the patient or authorized representative who has indicated his/her understanding and acceptance.     Plan Discussed with: CRNA  Anesthesia Plan Comments:         Anesthesia Quick Evaluation

## 2015-12-26 NOTE — Discharge Instructions (Addendum)
AV Fistula, Care After Refer to this sheet in the next few weeks. These instructions provide you with information on caring for yourself after your procedure. Your caregiver may also give you more specific instructions. Your treatment has been planned according to current medical practices, but problems sometimes occur. Call your caregiver if you have any problems or questions after your procedure. HOME CARE INSTRUCTIONS   Do not drive a car or take public transportation alone.  Do not drink alcohol.  Only take medicine that has been prescribed by your caregiver.  Do not sign important papers or make important decisions.  Have a responsible person with you.  Ask your caregiver to show you how to check your access at home for a vibration (called a "thrill") or for a sound (called a "bruit" pronounced brew-ee).  Your vein will need time to enlarge and mature so needles can be inserted for dialysis. Follow your caregiver's instructions about what you need to do to make this happen.  Keep dressings clean and dry.  Keep the arm elevated above your heart. Use a pillow.  Rest.  Use the arm as usual for all activities.  Have the stitches or tape closures removed in 10 to 14 days, or as directed by your caregiver.  Do not sleep or lie on the area of the fistula or that arm. This may decrease or stop the blood flow through your fistula.  Do not allow blood pressures to be taken on this arm.  Do not allow blood drawing to be done from the graft.  Do not wear tight clothing around the access site or on the arm.  Avoid lifting heavy objects with the arm that has the fistula.  Do not use creams or lotions over the access site. SEEK MEDICAL CARE IF:   You have a fever.  You have swelling around the fistula that gets worse, or you have new pain.  You have unusual bleeding at the fistula site or from any other area.  You have pus or other drainage at the fistula site.  You  have skin redness or red streaking on the skin around, above, or below the fistula site.  Your access site feels warm.  You have any flu-like symptoms. SEEK IMMEDIATE MEDICAL CARE IF:   You have pain, numbness, or an unusual pale skin on the hand or on the side of your fistula.  You have dizziness or weakness that you have not had before.  You have shortness of breath.  You have chest pain.  Your fistula disconnects or breaks, and there is bleeding that cannot be easily controlled. Call for local emergency medical help. Do not try to drive yourself to the hospital. MAKE SURE YOU  Understand these instructions.  Will watch your condition.  Will get help right away if you are not doing well or get worse.   This information is not intended to replace advice given to you by your health care provider. Make sure you discuss any questions you have with your health care provider.   Document Released: 02/10/2005 Document Revised: 03/03/2014 Document Reviewed: 07/31/2010 Elsevier Interactive Patient Education 2016 [place]   1) The drugs that you were given will stay in your system until tomorrow so for the next 24 hours you should not:  A) Drive an automobile B) Make any legal decisions C) Drink any alcoholic beverage   2) You may resume regular meals tomorrow.  Today it is better to start with liquids and gradually work up to solid foods.  You may eat anything you prefer, but it is better to start with liquids, then soup and crackers, and gradually work up to solid foods.   3) Please notify your doctor immediately if you have any unusual bleeding, trouble breathing, redness and pain at the surgery site, drainage, fever, or pain not relieved by medication.    4) Additional Instructions:Drink a lot of fluids to replenish what you missed today.     Please contact your physician with any problems or Same Day Surgery at  [phone], Monday through Friday 6 am to 4 pm, or [place] at [facility] number at [phone].

## 2015-12-26 NOTE — Anesthesia Procedure Notes (Signed)
Procedure Name: LMA Insertion Performed by: Demetrius Charity Pre-anesthesia Checklist: Patient identified, Patient being monitored, Timeout performed, Emergency Drugs available and Suction available Patient Re-evaluated:Patient Re-evaluated prior to inductionOxygen Delivery Method: Circle system utilized Preoxygenation: Pre-oxygenation with 100% oxygen Intubation Type: IV induction Ventilation: Mask ventilation without difficulty LMA: LMA inserted LMA Size: 4.0 Tube type: Oral Number of attempts: 1 Placement Confirmation: positive ETCO2 and breath sounds checked- equal and bilateral Tube secured with: Tape Dental Injury: Teeth and Oropharynx as per pre-operative assessment

## 2015-12-27 ENCOUNTER — Encounter: Payer: Self-pay | Admitting: Vascular Surgery

## 2016-03-26 ENCOUNTER — Ambulatory Visit (INDEPENDENT_AMBULATORY_CARE_PROVIDER_SITE_OTHER): Payer: 59 | Admitting: Vascular Surgery

## 2016-03-26 ENCOUNTER — Encounter (INDEPENDENT_AMBULATORY_CARE_PROVIDER_SITE_OTHER): Payer: Self-pay

## 2016-03-26 ENCOUNTER — Other Ambulatory Visit (INDEPENDENT_AMBULATORY_CARE_PROVIDER_SITE_OTHER): Payer: 59

## 2016-03-26 ENCOUNTER — Other Ambulatory Visit (INDEPENDENT_AMBULATORY_CARE_PROVIDER_SITE_OTHER): Payer: Self-pay | Admitting: Vascular Surgery

## 2016-03-26 ENCOUNTER — Encounter (INDEPENDENT_AMBULATORY_CARE_PROVIDER_SITE_OTHER): Payer: Self-pay | Admitting: Vascular Surgery

## 2016-03-26 VITALS — BP 149/89 | HR 73 | Resp 16 | Ht 71.0 in | Wt 219.0 lb

## 2016-03-26 DIAGNOSIS — F17200 Nicotine dependence, unspecified, uncomplicated: Secondary | ICD-10-CM

## 2016-03-26 DIAGNOSIS — T829XXA Unspecified complication of cardiac and vascular prosthetic device, implant and graft, initial encounter: Secondary | ICD-10-CM | POA: Diagnosis not present

## 2016-03-26 DIAGNOSIS — Z992 Dependence on renal dialysis: Secondary | ICD-10-CM | POA: Diagnosis not present

## 2016-03-26 DIAGNOSIS — N186 End stage renal disease: Secondary | ICD-10-CM

## 2016-03-26 NOTE — Progress Notes (Signed)
Subjective:    Patient ID: Oscar Moody., male    DOB: 1970-11-12, 46 y.o.   MRN: [id number] Chief Complaint  Patient presents with  . Re-evaluation    HDA of fistula   Patient presents at request of dialysis center. Unable to run appropriately. Patient has a very flat affect today. Not looking at provider in the eye or answering questions. The patient underwent a duplex ultrasound of the AV access which was notable for no flow noted at right anastomosis and adjacent wrist level. The patient denies any fistula skin breakdown, pain, edema, pallor or ulceration of the arm / hand.   Review of Systems  Constitutional: Negative.   HENT: Negative.   Eyes: Negative.   Respiratory: Negative.   Cardiovascular: Negative.   Gastrointestinal: Negative.   Endocrine: Negative.   Genitourinary:       Dialysis access not working.  Musculoskeletal: Negative.   Skin: Negative.   Allergic/Immunologic: Negative.   Neurological: Negative.   Hematological: Negative.   Psychiatric/Behavioral: Negative.        Objective:   Physical Exam  Constitutional: He is oriented to person, place, and time. He appears well-developed and well-nourished.  HENT:  Head: Normocephalic and atraumatic.  Right Ear: External ear normal.  Left Ear: External ear normal.  Eyes: Conjunctivae are normal. Pupils are equal, round, and reactive to light.  Neck: Normal range of motion.  Cardiovascular: Normal rate, regular rhythm and normal heart sounds.   Pulses:      Radial pulses are 1+ on the right side, and 2+ on the left side.       Dorsalis pedis pulses are 2+ on the right side, and 2+ on the left side.       Posterior tibial pulses are 2+ on the right side, and 2+ on the left side.  No thrill or bruit in access.  Pulmonary/Chest: Effort normal and breath sounds normal.  Abdominal: Soft. Bowel sounds are normal.  Musculoskeletal: Normal range of motion. He exhibits no edema.  Neurological: He is alert and  oriented to person, place, and time.  Skin: Skin is warm and dry.  Psychiatric: He has a normal mood and affect. His behavior is normal. Judgment and thought content normal.   BP (!) 149/89 (BP Location: Right Arm)   Pulse 73   Resp 16   Ht 5\' 11"  (1.803 m)   Wt 219 lb (99.3 kg)   BMI 30.54 kg/m   Past Medical History:  Diagnosis Date  . Anxiety   . Arthritis   . Asthma    childhood asthma  . Chronic kidney disease   . Current every day smoker   . DVT (deep venous thrombosis) ([place])   . Dyspnea    with exertion  . GERD (gastroesophageal reflux disease)   . Headache   . Hypercholesteremia   . Hypertension   . Peripheral vascular disease [facility])    Social History   Social History  . Marital status: Single    Spouse name: N/A  . Number of children: N/A  . Years of education: N/A   Occupational History  . Not on file.   Social History Main Topics  . Smoking status: Current Every Day Smoker    Packs/day: 0.25    Types: Cigarettes  . Smokeless tobacco: Never Used  . Alcohol use No  . Drug use: Yes    Types: Marijuana     Comment: occassional (when out of pain medication)  . Sexual  activity: Not on file   Other Topics Concern  . Not on file   Social History Narrative  . No narrative on file   Past Surgical History:  Procedure Laterality Date  . AV FISTULA PLACEMENT Right 12/26/2015   Procedure: ARTERIOVENOUS (AV) FISTULA CREATION ( RADIAL CEPHALIC );  Surgeon: Katha Cabal, MD;  Location: [facility] ORS;  Service: Vascular;  Laterality: Right;  . FRACTURE SURGERY Left    screw in left wrist  . VASCULAR SURGERY Left 07/2015   stent in left thigh, Location:[place]   Family History  Problem Relation Age of Onset  . Hypertension Mother   . Hypertension Sister    No Known Allergies     Assessment & Plan:  Patient presents at request of dialysis center. Unable to run appropriately. Patient has a very flat affect today. Not looking at provider in the eye or  answering questions. The patient underwent a duplex ultrasound of the AV access which was notable for no flow noted at right anastomosis and adjacent wrist level. The patient denies any fistula skin breakdown, pain, edema, pallor or ulceration of the arm / hand.  1. ESRD on dialysis ([place]) - Worsening Right Radiocephalic AV Fistula Occluded Recommend Right Upper Extremity Fistula. Procedure, risks and benefits explained to patient. All questions answered. Patient is willing to proceed.  - VAS US DUPLEX DIALYSIS ACCESS (AVF,AVG); Future  2. Complication from renal dialysis device, initial encounter  - Worsening Right Radiocephalic AV Fistula Occluded Recommend Right Upper Extremity Fistula. Procedure, risks and benefits explained to patient. All questions answered. Patient is willing to proceed.  - VAS US DUPLEX DIALYSIS ACCESS (AVF,AVG); Future  3. Tobacco dependence - Stable I have discussed (approximately 5 minutes) with the patient the role of tobacco in the pathogenesis of atherosclerosis and its effect on the progression of the disease, impact on the durability of interventions and its limitations on the formation of collateral pathways. I have recommended absolute tobacco cessation. I have discussed various options available for assistance with tobacco cessation including over the counter methods (Nicotine gum, patch and lozenges). We also discussed prescription options (Chantix, Nicotine Inhaler / Nasal Spray). The patient is not interested in pursuing any prescription tobacco cessation options at this time. The patient voices their understanding.   Current Outpatient Prescriptions on File Prior to Visit  Medication Sig Dispense Refill  . amLODipine (NORVASC) 5 MG tablet Take 5 mg by mouth daily.    Marland Kitchen aspirin EC 81 MG tablet Take 81 mg by mouth daily.    . clopidogrel (PLAVIX) 75 MG tablet Take 75 mg by mouth daily.    Marland Kitchen HYDROcodone-acetaminophen (NORCO) 10-325 MG tablet     .  HYDROcodone-acetaminophen (NORCO) 5-325 MG tablet Take 1-2 tablets by mouth every 6 (six) hours as needed for moderate pain. 50 tablet 0  . lisinopril (PRINIVIL,ZESTRIL) 20 MG tablet Take 20 mg by mouth daily.    Marland Kitchen omeprazole (PRILOSEC) 40 MG capsule Take 40 mg by mouth daily.     No current facility-administered medications on file prior to visit.     There are no Patient Instructions on file for this visit. No Follow-up on file.   Zoe Goonan A Travell Desaulniers, PA-C

## 2016-03-28 ENCOUNTER — Ambulatory Visit
Admission: RE | Admit: 2016-03-28 | Discharge: 2016-03-28 | Disposition: A | Discharge status: Home / Self Care | Payer: 59 | Source: Ambulatory Visit | Attending: Vascular Surgery | Admitting: Vascular Surgery

## 2016-03-28 ENCOUNTER — Encounter
Admission: RE | Disposition: A | Discharge status: Home / Self Care | Payer: Self-pay | Source: Ambulatory Visit | Attending: Vascular Surgery

## 2016-03-28 ENCOUNTER — Encounter: Payer: Self-pay | Admitting: *Deleted

## 2016-03-28 DIAGNOSIS — I129 Hypertensive chronic kidney disease with stage 1 through stage 4 chronic kidney disease, or unspecified chronic kidney disease: Secondary | ICD-10-CM | POA: Insufficient documentation

## 2016-03-28 DIAGNOSIS — Z992 Dependence on renal dialysis: Secondary | ICD-10-CM | POA: Diagnosis not present

## 2016-03-28 DIAGNOSIS — I871 Compression of vein: Secondary | ICD-10-CM | POA: Insufficient documentation

## 2016-03-28 DIAGNOSIS — I739 Peripheral vascular disease, unspecified: Secondary | ICD-10-CM | POA: Insufficient documentation

## 2016-03-28 DIAGNOSIS — Y832 Surgical operation with anastomosis, bypass or graft as the cause of abnormal reaction of the patient, or of later complication, without mention of misadventure at the time of the procedure: Secondary | ICD-10-CM | POA: Diagnosis not present

## 2016-03-28 DIAGNOSIS — Z7982 Long term (current) use of aspirin: Secondary | ICD-10-CM | POA: Insufficient documentation

## 2016-03-28 DIAGNOSIS — F17210 Nicotine dependence, cigarettes, uncomplicated: Secondary | ICD-10-CM | POA: Diagnosis not present

## 2016-03-28 DIAGNOSIS — M1990 Unspecified osteoarthritis, unspecified site: Secondary | ICD-10-CM | POA: Diagnosis not present

## 2016-03-28 DIAGNOSIS — N189 Chronic kidney disease, unspecified: Secondary | ICD-10-CM | POA: Insufficient documentation

## 2016-03-28 DIAGNOSIS — Z86718 Personal history of other venous thrombosis and embolism: Secondary | ICD-10-CM | POA: Insufficient documentation

## 2016-03-28 DIAGNOSIS — R51 Headache: Secondary | ICD-10-CM | POA: Insufficient documentation

## 2016-03-28 DIAGNOSIS — Z7902 Long term (current) use of antithrombotics/antiplatelets: Secondary | ICD-10-CM | POA: Diagnosis not present

## 2016-03-28 DIAGNOSIS — N186 End stage renal disease: Secondary | ICD-10-CM

## 2016-03-28 DIAGNOSIS — T82898A Other specified complication of vascular prosthetic devices, implants and grafts, initial encounter: Secondary | ICD-10-CM | POA: Insufficient documentation

## 2016-03-28 DIAGNOSIS — Z8249 Family history of ischemic heart disease and other diseases of the circulatory system: Secondary | ICD-10-CM | POA: Diagnosis not present

## 2016-03-28 DIAGNOSIS — E7800 Pure hypercholesterolemia, unspecified: Secondary | ICD-10-CM | POA: Insufficient documentation

## 2016-03-28 DIAGNOSIS — K219 Gastro-esophageal reflux disease without esophagitis: Secondary | ICD-10-CM | POA: Diagnosis not present

## 2016-03-28 HISTORY — PX: PERIPHERAL VASCULAR CATHETERIZATION: SHX172C

## 2016-03-28 LAB — BASIC METABOLIC PANEL
Anion gap: 9 (ref 5–15)
BUN: 55 mg/dL — ABNORMAL HIGH (ref 6–20)
CO2: 20 mmol/L — AB (ref 22–32)
Calcium: 8.8 mg/dL — ABNORMAL LOW (ref 8.9–10.3)
Chloride: 109 mmol/L (ref 101–111)
Creatinine, Ser: 6.07 mg/dL — ABNORMAL HIGH (ref 0.61–1.24)
GFR calc Af Amer: 12 mL/min — ABNORMAL LOW (ref >60)
GFR, EST NON AFRICAN AMERICAN: 10 mL/min — AB (ref >60)
GLUCOSE: 101 mg/dL — AB (ref 65–99)
POTASSIUM: 5.6 mmol/L — AB (ref 3.5–5.1)
Sodium: 138 mmol/L (ref 135–145)

## 2016-03-28 SURGERY — A/V FISTULAGRAM
Anesthesia: Moderate Sedation | Laterality: Right

## 2016-03-28 MED ORDER — FENTANYL CITRATE (PF) 100 MCG/2ML IJ SOLN
INTRAMUSCULAR | Status: AC
  Filled 2016-03-28: qty 2

## 2016-03-28 MED ORDER — ONDANSETRON HCL 4 MG/2ML IJ SOLN
4.0000 mg | Freq: Once | INTRAMUSCULAR | Status: AC
Start: 2016-03-28 — End: 2016-03-28
  Administered 2016-03-28: 4 mg via INTRAVENOUS

## 2016-03-28 MED ORDER — IOPAMIDOL (ISOVUE-300) INJECTION 61%
INTRAVENOUS | Status: DC | PRN
  Administered 2016-03-28: 10 mL via INTRA_ARTERIAL

## 2016-03-28 MED ORDER — FENTANYL CITRATE (PF) 100 MCG/2ML IJ SOLN
INTRAMUSCULAR | Status: DC | PRN
  Administered 2016-03-28: 100 ug via INTRAVENOUS
  Administered 2016-03-28 (×2): 50 ug via INTRAVENOUS
  Administered 2016-03-28: 100 ug via INTRAVENOUS

## 2016-03-28 MED ORDER — HEPARIN (PORCINE) IN NACL 2-0.9 UNIT/ML-% IJ SOLN
INTRAMUSCULAR | Status: AC
  Filled 2016-03-28: qty 1000

## 2016-03-28 MED ORDER — LIDOCAINE HCL (PF) 1 % IJ SOLN
INTRAMUSCULAR | Status: AC
  Filled 2016-03-28: qty 10

## 2016-03-28 MED ORDER — MIDAZOLAM HCL 5 MG/5ML IJ SOLN
INTRAMUSCULAR | Status: AC
  Filled 2016-03-28: qty 5

## 2016-03-28 MED ORDER — SODIUM CHLORIDE 0.9 % IV SOLN
INTRAVENOUS | Status: DC
  Administered 2016-03-28: 10:00:00 via INTRAVENOUS

## 2016-03-28 MED ORDER — HEPARIN SODIUM (PORCINE) 1000 UNIT/ML IJ SOLN
INTRAMUSCULAR | Status: AC
  Filled 2016-03-28: qty 1

## 2016-03-28 MED ORDER — CEFAZOLIN IN D5W 1 GM/50ML IV SOLN
1.0000 g | Freq: Once | INTRAVENOUS | Status: AC
  Administered 2016-03-28: 1 g via INTRAVENOUS

## 2016-03-28 MED ORDER — MIDAZOLAM HCL 2 MG/2ML IJ SOLN
INTRAMUSCULAR | Status: DC | PRN
  Administered 2016-03-28: 1 mg via INTRAVENOUS
  Administered 2016-03-28 (×3): 2 mg via INTRAVENOUS

## 2016-03-28 MED ORDER — ONDANSETRON HCL 4 MG/2ML IJ SOLN
INTRAMUSCULAR | Status: AC
Start: 2016-03-28 — End: 2016-03-28
  Filled 2016-03-28: qty 2

## 2016-03-28 SURGICAL SUPPLY — 9 items
CATH TORCON 5FR 0.38 (CATHETERS) ×4 IMPLANT
DEVICE TORQUE (MISCELLANEOUS) ×4 IMPLANT
DRAPE BRACHIAL (DRAPES) ×4 IMPLANT
GLIDEWIRE STIFF .35X180X3 HYDR (WIRE) ×4 IMPLANT
PACK ANGIOGRAPHY (CUSTOM PROCEDURE TRAY) ×4 IMPLANT
SET INTRO CAPELLA COAXIAL (SET/KITS/TRAYS/PACK) ×8 IMPLANT
SHEATH BRITE TIP 6FRX5.5 (SHEATH) ×4 IMPLANT
TOWEL OR 17X26 4PK STRL BLUE (TOWEL DISPOSABLE) ×4 IMPLANT
WIRE NITINOL .018 (WIRE) ×4 IMPLANT

## 2016-03-28 NOTE — Discharge Instructions (Signed)
Fistulogram, Care After °Introduction °Refer to this sheet in the next few weeks. These instructions provide you with information on caring for yourself after your procedure. Your health care provider may also give you more specific instructions. Your treatment has been planned according to current medical practices, but problems sometimes occur. Call your health care provider if you have any problems or questions after your procedure. °What can I expect after the procedure? °After your procedure, it is typical to have the following: °· A small amount of discomfort in the area where the catheters were placed. °· A small amount of bruising around the fistula. °· Sleepiness and fatigue. °Follow these instructions at home: °· Rest at home for the day following your procedure. °· Do not drive or operate heavy machinery while taking pain medicine. °· Take medicines only as directed by your health care provider. °· Do not take baths, swim, or use a hot tub until your health care provider approves. You may shower 24 hours after the procedure or as directed by your health care provider. °· There are many different ways to close and cover an incision, including stitches, skin glue, and adhesive strips. Follow your health care provider's instructions on: °¨ Incision care. °¨ Bandage (dressing) changes and removal. °¨ Incision closure removal. °· Monitor your dialysis fistula carefully. °Contact a health care provider if: °· You have drainage, redness, swelling, or pain at your catheter site. °· You have a fever. °· You have chills. °Get help right away if: °· You feel weak. °· You have trouble balancing. °· You have trouble moving your arms or legs. °· You have problems with your speech or vision. °· You can no longer feel a vibration or buzz when you put your fingers over your dialysis fistula. °· The limb that was used for the procedure: °¨ Swells. °¨ Is painful. °¨ Is cold. °¨ Is discolored, such as blue or pale  white. °This information is not intended to replace advice given to you by your health care provider. Make sure you discuss any questions you have with your health care provider. °Document Released: 06/27/2013 Document Revised: 07/19/2015 Document Reviewed: 04/01/2013 °© 2017 Elsevier ° °

## 2016-03-28 NOTE — Op Note (Signed)
[facility]  Percutaneous Study/Intervention Procedural Note   Date of Surgery: 03/28/2016,1:11 PM  Surgeon:Arianis Bowditch, Dolores Lory   Pre-operative Diagnosis: Superior vena cava syndrome; Complication AV vascular device with multiple past accesses; and stage renal disease requiring hemodialysis  Post-operative diagnosis:  Same  Procedure(s) Performed:  1.  Ultrasound-guided access to the right arm vein  2.  Right upper extremity venogram   Anesthesia: Conscious sedation was administered under my direct supervision. IV Versed plus fentanyl were utilized. Continuous ECG, pulse oximetry and blood pressure was monitored throughout the entire procedure. Conscious sedation was administered for a total of 45 minutes.  Sheath: 5 Fr Micro sheatl  Contrast: 15 cc   Fluoroscopy Time: <0.5 minutes  Indications:  Patient presents for evaluation for possible AV fistula salvage vs revision.  The risks and benefits of been reviewed all questions answered patient agrees to proceed.  Procedure:  [place] a 46 y.o. male who was identified and appropriate procedural time out was performed.  The patient was then placed supine on the table and prepped and draped in the usual sterile fashion.  Ultrasound was used to evaluate the right arm veins.  One is selected which is compressible indicating It was patent .  A digital ultrasound image was acquired.  A micro-puncture needle was used to access the  vein under direct ultrasound guidance and a permanent image was saved for the record.  The microwire was then advanced under fluoroscopic guidance followed by micro-sheath.  Hand injection of contrast was then performed to demonstrate the venous anatomy of the right  upper arm.  Findings:    Right upper extremity: small non usable veins for fistula formation    Disposition: Patient was taken to the recovery room in stable condition having tolerated the procedure well.  Belenda Cruise  Nnaemeka Samson 03/28/2016,1:11 PM

## 2016-03-28 NOTE — H&P (Signed)
[facility] History & Physical Update  The patient was interviewed and re-examined.  The patient's previous History and Physical has been reviewed and is unchanged.  There is no change in the plan of care. We plan to proceed with the scheduled procedure.  Hortencia Pilar, MD  03/28/2016, 9:48 AM

## 2016-03-31 ENCOUNTER — Encounter: Payer: Self-pay | Admitting: Vascular Surgery

## 2016-04-30 DIAGNOSIS — F411 Generalized anxiety disorder: Secondary | ICD-10-CM | POA: Insufficient documentation

## 2018-03-29 DIAGNOSIS — M79672 Pain in left foot: Secondary | ICD-10-CM | POA: Insufficient documentation

## 2018-03-29 DIAGNOSIS — N19 Unspecified kidney failure: Secondary | ICD-10-CM | POA: Insufficient documentation

## 2018-03-30 DIAGNOSIS — F17200 Nicotine dependence, unspecified, uncomplicated: Secondary | ICD-10-CM | POA: Insufficient documentation

## 2018-05-10 ENCOUNTER — Other Ambulatory Visit: Payer: Self-pay | Admitting: Physician Assistant

## 2018-05-10 DIAGNOSIS — N189 Chronic kidney disease, unspecified: Secondary | ICD-10-CM | POA: Insufficient documentation

## 2018-05-10 DIAGNOSIS — I739 Peripheral vascular disease, unspecified: Secondary | ICD-10-CM | POA: Insufficient documentation

## 2018-05-10 DIAGNOSIS — E785 Hyperlipidemia, unspecified: Secondary | ICD-10-CM | POA: Insufficient documentation

## 2018-05-10 DIAGNOSIS — Z716 Tobacco abuse counseling: Secondary | ICD-10-CM | POA: Insufficient documentation

## 2018-09-20 ENCOUNTER — Ambulatory Visit
Admission: EM | Admit: 2018-09-20 | Discharge: 2018-09-20 | Disposition: A | Discharge status: Home / Self Care | Payer: Medicare Other | Attending: Family Medicine | Admitting: Family Medicine

## 2018-09-20 ENCOUNTER — Other Ambulatory Visit: Payer: Self-pay

## 2018-09-20 DIAGNOSIS — L299 Pruritus, unspecified: Secondary | ICD-10-CM

## 2018-09-20 MED ORDER — HYDROXYZINE HCL 25 MG PO TABS: 12.5000 mg | ORAL_TABLET | Freq: Three times a day (TID) | ORAL | 0 refills | Status: DC | PRN

## 2018-09-20 MED ORDER — PREDNISONE 10 MG (21) PO TBPK: ORAL_TABLET | ORAL | 0 refills | Status: DC

## 2018-09-20 NOTE — ED Triage Notes (Signed)
Patient states that he had surgery on Friday for an AV Fistula in his right arm. Patient states that his entire body feels like it is itchy and painful. Patient states that fistula is also painful and he is concerned something is wrong.

## 2018-09-20 NOTE — ED Provider Notes (Signed)
[facility]    CSN: [id number] Arrival date & time: 09/20/18  1638  History   Chief Complaint Chief Complaint  Patient presents with  . Pruritis   HPI  48 year old male presents with itching.  Patient had an AV fistula placed on Friday.  Patient states that on Sunday he developed diffuse itching.  He reports that his skin is red as well.  He continues to scratch vigorously.  He reports pain associated with his fistula site.  However, the site looks good.  Patient has a history of chronic pain and has been on Suboxone.  Patient states that he was given Percocet following his surgery.  He states that he has had itching previously with Percocet.  This may be the cause of his symptoms.  No reports of hives.  No reports of shortness of breath.  He has used hydrocortisone and Benadryl without resolution.  Patient reports that his pain is 8/10 in severity and is located at his AV fistula site on the right.  No other reported symptoms.  No other complaints.  PMH, Surgical Hx, Family Hx, Social History reviewed and updated as below.  Past Medical History:  Diagnosis Date  . Anxiety   . Arthritis   . Asthma    childhood asthma  . Chronic kidney disease   . Current every day smoker   . DVT (deep venous thrombosis) ([place])   . Dyspnea    with exertion  . GERD (gastroesophageal reflux disease)   . Headache   . Hypercholesteremia   . Hypertension   . Peripheral vascular disease [facility])     Patient Active Problem List   Diagnosis Date Noted  . ESRD on dialysis ([place]) 03/26/2016  . Complication from renal dialysis device 03/26/2016  . Essential hypertension 12/11/2015  . Tobacco dependence 12/11/2015    Past Surgical History:  Procedure Laterality Date  . AV FISTULA PLACEMENT Right 12/26/2015   Procedure: ARTERIOVENOUS (AV) FISTULA CREATION ( RADIAL CEPHALIC );  Surgeon: Katha Cabal, MD;  Location: [facility]S;  Service: Vascular;  Laterality: Right;  . FRACTURE SURGERY  Left    screw in left wrist  . PERIPHERAL VASCULAR CATHETERIZATION Right 03/28/2016   Procedure: A/V Fistulagram;  Surgeon: Katha Cabal, MD;  Location: [facility];  Service: Cardiovascular;  Laterality: Right;  . PERIPHERAL VASCULAR CATHETERIZATION N/A 03/28/2016   Procedure: A/V Shunt Intervention;  Surgeon: Katha Cabal, MD;  Location: [facility];  Service: Cardiovascular;  Laterality: N/A;  . VASCULAR SURGERY Left 07/2015   stent in left thigh, Location:[place]       Home Medications    Prior to Admission medications   Medication Sig Start Date End Date Taking? Authorizing Provider  amLODipine (NORVASC) 10 MG tablet Take 10 mg by mouth daily. 09/14/18  Yes [provider]  aspirin EC 81 MG tablet Take 81 mg by mouth daily.   Yes [provider]  Buprenorphine HCl-Naloxone HCl (SUBOXONE) 8-2 MG FILM Place 0.25 Film under the tongue daily.   Yes [provider]  calcium acetate (PHOSLO) 667 MG capsule TAKE 3 CAPSULES BY MOUTH THREE TIMES DAILY WITH MEALS AND 1 CAPSULE WITH SNACKS 09/07/18  Yes [provider]  clopidogrel (PLAVIX) 75 MG tablet Take 75 mg by mouth daily.   Yes [provider]  lisinopril (PRINIVIL,ZESTRIL) 40 MG tablet Take 40 mg by mouth daily.   Yes [provider]  omeprazole (PRILOSEC OTC) 20 MG tablet Take 40  mg by mouth daily.   Yes [provider]  hydrOXYzine (ATARAX/VISTARIL) 25 MG tablet Take 0.5 tablets (12.5 mg total) by mouth every 8 (eight) hours as needed for itching. 09/20/18   Coral Spikes, DO  predniSONE (STERAPRED UNI-PAK 21 TAB) 10 MG (21) TBPK tablet 6 tablets on day 1; decrease by 1 tablet daily until gone. 09/20/18   Coral Spikes, DO    Family History Family History  Problem Relation Age of Onset  . Hypertension Mother   . Hypertension Sister     Social History Social History   Tobacco Use  . Smoking status: Current Every Day Smoker    Packs/day:  0.25    Types: Cigarettes  . Smokeless tobacco: Never Used  Substance Use Topics  . Alcohol use: No  . Drug use: Yes    Types: Marijuana    Comment: occassional (when out of pain medication)     Allergies   Patient has no known allergies.   Review of Systems Review of Systems  Constitutional: Negative.   Skin:       Itching, redness.   Physical Exam Triage Vital Signs ED Triage Vitals  Enc Vitals Group     BP 09/20/18 0832 (!) 158/84     Pulse Rate 09/20/18 0832 92     Resp 09/20/18 0832 18     Temp 09/20/18 0832 98.5 F (36.9 C)     Temp Source 09/20/18 0832 Oral     SpO2 09/20/18 0832 99 %     Weight 09/20/18 0829 218 lb (98.9 kg)     Height 09/20/18 0829 5\' 11"  (1.803 m)     Head Circumference --      Peak Flow --      Pain Score 09/20/18 0828 8     Pain Loc --      Pain Edu? --      Excl. in [place]? --    Updated Vital Signs BP (!) 158/84 (BP Location: Left Arm)   Pulse 92   Temp 98.5 F (36.9 C) (Oral)   Resp 18   Ht 5\' 11"  (1.803 m)   Wt 98.9 kg   SpO2 99%   BMI 30.40 kg/m   Visual Acuity Right Eye Distance:   Left Eye Distance:   Bilateral Distance:    Right Eye Near:   Left Eye Near:    Bilateral Near:     Physical Exam Constitutional:      General: He is not in acute distress.    Appearance: Normal appearance. He is not ill-appearing.  HENT:     Head: Normocephalic and atraumatic.  Eyes:     General:        Right eye: No discharge.        Left eye: No discharge.     Conjunctiva/sclera: Conjunctivae normal.  Pulmonary:     Effort: Pulmonary effort is normal. No respiratory distress.  Skin:    Comments: Skin with scattered erythema. No urticaria.  Neurological:     Mental Status: He is alert.  Psychiatric:        Mood and Affect: Mood normal.        Behavior: Behavior normal.    UC Treatments / Results  Labs (all labs ordered are listed, but only abnormal results are displayed) Labs Reviewed - No data to display  EKG    Radiology No results found.  Procedures Procedures (including critical care time)  Medications Ordered in UC Medications - No  data to display  Initial Impression / Assessment and Plan / UC Course  I have reviewed the triage vital signs and the nursing notes.  Pertinent labs & imaging results that were available during my care of the patient were reviewed by me and considered in my medical decision making (see chart for details).    48 year old male presents with pruritus.  There is no evidence of an acute allergic reaction (no hives, signs/symptoms or anaphylaxis, etc).  Suspect this is from the oxycodone.  Possibly related to what he received intraoperatively.  Treating with Atarax and prednisone.  Advised to contact vascular surgeon.  Final Clinical Impressions(s) / UC Diagnoses   Final diagnoses:  Pruritus     Discharge Instructions     Medication as prescribed.  Call your surgeon.  Take care  Dr. Lacinda Axon    ED Prescriptions    Medication Sig Dispense Auth. Provider   hydrOXYzine (ATARAX/VISTARIL) 25 MG tablet Take 0.5 tablets (12.5 mg total) by mouth every 8 (eight) hours as needed for itching. 10 tablet Macyn Shropshire G, DO   predniSONE (STERAPRED UNI-PAK 21 TAB) 10 MG (21) TBPK tablet 6 tablets on day 1; decrease by 1 tablet daily until gone. 21 tablet Coral Spikes, DO     Controlled Substance Prescriptions [place] Controlled Substance Registry consulted? Not Applicable   Coral Spikes, [place] 09/20/18 [phone]

## 2018-09-20 NOTE — Discharge Instructions (Signed)
Medication as prescribed.  Call your surgeon.  Take care  Dr. Lacinda Axon

## 2019-02-01 ENCOUNTER — Ambulatory Visit
Admission: EM | Admit: 2019-02-01 | Discharge: 2019-02-01 | Disposition: A | Discharge status: Home / Self Care | Payer: Medicare Other | Attending: Family Medicine | Admitting: Family Medicine

## 2019-02-01 ENCOUNTER — Encounter: Payer: Self-pay | Admitting: Emergency Medicine

## 2019-02-01 ENCOUNTER — Other Ambulatory Visit: Payer: Self-pay

## 2019-02-01 DIAGNOSIS — M79671 Pain in right foot: Secondary | ICD-10-CM

## 2019-02-01 DIAGNOSIS — M79672 Pain in left foot: Secondary | ICD-10-CM

## 2019-02-01 DIAGNOSIS — K0889 Other specified disorders of teeth and supporting structures: Secondary | ICD-10-CM | POA: Diagnosis not present

## 2019-02-01 MED ORDER — TRAMADOL HCL 50 MG PO TABS: 25.0000 mg | ORAL_TABLET | Freq: Two times a day (BID) | ORAL | 0 refills | Status: AC | PRN

## 2019-02-01 NOTE — Discharge Instructions (Signed)
Pain medication as needed. See Dentist.  Consider seeing [facility]iat[facility]).  Take care  Dr. Lacinda Axon

## 2019-02-01 NOTE — ED Triage Notes (Signed)
Pt c/o dental pain. Started about a week ago. Pain is located in the bottom left side of his mouth. Pt also c/o foot pain. pain is located on the bottom of both his feet on the heel area.

## 2019-02-01 NOTE — ED Provider Notes (Signed)
[facility]    CSN: [id number] Arrival date & time: 02/01/19  1757  History   Chief Complaint Chief Complaint  Patient presents with  . Dental Pain   HPI  48 year old male presents with dental pain and heel pain.  Patient reports ongoing dental pain of single tooth on the left lower dentition.  Started last week after a filling fell out per his report.  He rates his pain a 6/10 in severity.  Denies fever.  Additionally, patient reports ongoing bilateral heel pain.  This appears to be chronic and has been going on for quite some time.  He has not seen a podiatrist.  His pain is located on the bottom of both heels.  Worse in the morning.  Worse with activity.  He has tried multiple heel inserts/shoe inserts without resolution.  No relieving factors.  No other reported symptoms.  No other complaints.  PMH, Surgical Hx, Family Hx, Social History reviewed and updated as below.  Past Medical History:  Diagnosis Date  . Anxiety   . Arthritis   . Asthma    childhood asthma  . Chronic kidney disease   . Current every day smoker   . DVT (deep venous thrombosis) ([place])   . Dyspnea    with exertion  . GERD (gastroesophageal reflux disease)   . Headache   . Hypercholesteremia   . Hypertension   . Peripheral vascular disease [facility])     Patient Active Problem List   Diagnosis Date Noted  . ESRD on dialysis ([place]) 03/26/2016  . Complication from renal dialysis device 03/26/2016  . Essential hypertension 12/11/2015  . Tobacco dependence 12/11/2015    Past Surgical History:  Procedure Laterality Date  . AV FISTULA PLACEMENT Right 12/26/2015   Procedure: ARTERIOVENOUS (AV) FISTULA CREATION ( RADIAL CEPHALIC );  Surgeon: Katha Cabal, MD;  Location: [facility] ORS;  Service: Vascular;  Laterality: Right;  . FRACTURE SURGERY Left    screw in left wrist  . PERIPHERAL VASCULAR CATHETERIZATION Right 03/28/2016   Procedure: A/V Fistulagram;  Surgeon: Katha Cabal, MD;  Location:  [place] [facility];  Service: Cardiovascular;  Laterality: Right;  . PERIPHERAL VASCULAR CATHETERIZATION N/A 03/28/2016   Procedure: A/V Shunt Intervention;  Surgeon: Katha Cabal, MD;  Location: [place] [facility];  Service: Cardiovascular;  Laterality: N/A;  . VASCULAR SURGERY Left 07/2015   stent in left thigh, Location:[place]       Home Medications    Prior to Admission medications   Medication Sig Start Date End Date Taking? Authorizing Provider  amLODipine (NORVASC) 10 MG tablet Take 10 mg by mouth daily. 09/14/18  Yes [provider]  calcium acetate (PHOSLO) 667 MG capsule TAKE 3 CAPSULES BY MOUTH THREE TIMES DAILY WITH MEALS AND 1 CAPSULE WITH SNACKS 09/07/18  Yes [provider]  clopidogrel (PLAVIX) 75 MG tablet Take 75 mg by mouth daily.   Yes [provider]  lisinopril (PRINIVIL,ZESTRIL) 40 MG tablet Take 40 mg by mouth daily.   Yes [provider]  omeprazole (PRILOSEC OTC) 20 MG tablet Take 40 mg by mouth daily.   Yes [provider]  hydrOXYzine (ATARAX/VISTARIL) 25 MG tablet Take 0.5 tablets (12.5 mg total) by mouth every 8 (eight) hours as needed for itching. 09/20/18   Coral Spikes, DO  traMADol (ULTRAM) 50 MG tablet Take 0.5-1 tablets (25-50 mg total) by mouth every 12 (twelve) hours as needed for up to 5 days. 02/01/19 02/06/19  Lacinda Axon,  Barnie Del, DO    Family History Family History  Problem Relation Age of Onset  . Hypertension Mother   . Hypertension Sister     Social History Social History   Tobacco Use  . Smoking status: Current Every Day Smoker    Packs/day: 0.25    Types: Cigarettes  . Smokeless tobacco: Never Used  Substance Use Topics  . Alcohol use: No  . Drug use: Yes    Types: Marijuana    Comment: occassional (when out of pain medication)     Allergies   Oxycodone-acetaminophen   Review of Systems Review of Systems  HENT: Positive for dental problem.   Musculoskeletal:        Bilateral heel pain.   Physical Exam Triage Vital Signs ED Triage Vitals  Enc Vitals Group     BP 02/01/19 1830 (!) 108/91     Pulse Rate 02/01/19 1830 85     Resp 02/01/19 1830 18     Temp 02/01/19 1830 98.5 F (36.9 C)     Temp Source 02/01/19 1830 Oral     SpO2 02/01/19 1830 97 %     Weight 02/01/19 1827 208 lb (94.3 kg)     Height 02/01/19 1827 5\' 11"  (1.803 m)     Head Circumference --      Peak Flow --      Pain Score 02/01/19 1826 6     Pain Loc --      Pain Edu? --      Excl. in [place]? --    No data found.  Updated Vital Signs BP (!) 108/91 (BP Location: Left Arm)   Pulse 85   Temp 98.5 F (36.9 C) (Oral)   Resp 18   Ht 5\' 11"  (1.803 m)   Wt 94.3 kg   SpO2 97%   BMI 29.01 kg/m   Visual Acuity Right Eye Distance:   Left Eye Distance:   Bilateral Distance:    Right Eye Near:   Left Eye Near:    Bilateral Near:     Physical Exam Vitals signs and nursing note reviewed.  Constitutional:      General: He is not in acute distress.    Appearance: Normal appearance. He is not ill-appearing.  HENT:     Head: Normocephalic and atraumatic.     Mouth/Throat:      Comments: Dental caries noted on the lateral aspect of both labeled teeth. Eyes:     General:        Right eye: No discharge.        Left eye: No discharge.     Conjunctiva/sclera: Conjunctivae normal.  Cardiovascular:     Rate and Rhythm: Normal rate and regular rhythm.     Heart sounds: No murmur.  Pulmonary:     Effort: Pulmonary effort is normal.     Breath sounds: Normal breath sounds. No wheezing, rhonchi or rales.  Musculoskeletal:     Comments: Patient with tenderness over the heels on the plantar aspect bilaterally.  Neurological:     Mental Status: He is alert.  Psychiatric:        Mood and Affect: Mood normal.        Behavior: Behavior normal.    UC Treatments / Results  Labs (all labs ordered are listed, but only abnormal results are displayed) Labs Reviewed - No data to  display  EKG   Radiology No results found.  Procedures Procedures (including critical care time)  Medications Ordered  in UC Medications - No data to display  Initial Impression / Assessment and Plan / UC Course  I have reviewed the triage vital signs and the nursing notes.  Pertinent labs & imaging results that were available during my care of the patient were reviewed by me and considered in my medical decision making (see chart for details).    48 year old male presents with dental pain secondary to dental caries.  Tramadol as needed for pain.  Cannot use NSAIDs in the setting of ESRD.  [place] controlled substance database reviewed.  No concerns at this time.  Regarding his heel pain, I suspect that this is secondary to plantar fasciitis.  He can use the tramadol as directed.  Advised to see podiatry.  Final Clinical Impressions(s) / UC Diagnoses   Final diagnoses:  Pain, dental  Heel pain, bilateral     Discharge Instructions     Pain medication as needed. See Dentist.  Consider seeing [facility]iatry [facility]).  Take care  Dr. Lacinda Axon    ED Prescriptions    Medication Sig Dispense Auth. Provider   traMADol (ULTRAM) 50 MG tablet Take 0.5-1 tablets (25-50 mg total) by mouth every 12 (twelve) hours as needed for up to 5 days. 10 tablet Thersa Salt G, DO     I have reviewed the PDMP during this encounter.   Coral Spikes, [place] 02/01/19 1924

## 2019-04-15 DIAGNOSIS — E669 Obesity, unspecified: Secondary | ICD-10-CM | POA: Insufficient documentation

## 2020-11-17 ENCOUNTER — Other Ambulatory Visit
Admission: RE | Admit: 2020-11-17 | Discharge: 2020-11-17 | Disposition: A | Discharge status: Home / Self Care | Payer: Medicare Other | Source: Other Acute Inpatient Hospital | Attending: Nephrology | Admitting: Nephrology

## 2020-11-17 DIAGNOSIS — N186 End stage renal disease: Secondary | ICD-10-CM | POA: Diagnosis present

## 2020-11-17 LAB — CBC WITH DIFFERENTIAL/PLATELET
Abs Immature Granulocytes: 0.05 10*3/uL (ref 0.00–0.07)
Basophils Absolute: 0.1 10*3/uL (ref 0.0–0.1)
Basophils Relative: 1 %
Eosinophils Absolute: 0.2 10*3/uL (ref 0.0–0.5)
Eosinophils Relative: 3 %
HCT: 48.5 % (ref 39.0–52.0)
Hemoglobin: 14.7 g/dL (ref 13.0–17.0)
Immature Granulocytes: 1 %
Lymphocytes Relative: 16 %
Lymphs Abs: 1.2 10*3/uL (ref 0.7–4.0)
MCH: 27.3 pg (ref 26.0–34.0)
MCHC: 30.3 g/dL (ref 30.0–36.0)
MCV: 90 fL (ref 80.0–100.0)
Monocytes Absolute: 0.6 10*3/uL (ref 0.1–1.0)
Monocytes Relative: 8 %
Neutro Abs: 5.1 10*3/uL (ref 1.7–7.7)
Neutrophils Relative %: 71 %
Platelets: 209 10*3/uL (ref 150–400)
RBC: 5.39 MIL/uL (ref 4.22–5.81)
RDW: 15.9 % — ABNORMAL HIGH (ref 11.5–15.5)
WBC: 7.1 10*3/uL (ref 4.0–10.5)
nRBC: 0 % (ref 0.0–0.2)

## 2020-11-17 LAB — BASIC METABOLIC PANEL
Anion gap: 5 (ref 5–15)
BUN: 11 mg/dL (ref 6–20)
CO2: 11 mmol/L — ABNORMAL LOW (ref 22–32)
Calcium: 7.2 mg/dL — ABNORMAL LOW (ref 8.9–10.3)
Chloride: 117 mmol/L — ABNORMAL HIGH (ref 98–111)
Creatinine, Ser: 3.43 mg/dL — ABNORMAL HIGH (ref 0.61–1.24)
GFR, Estimated: 21 mL/min — ABNORMAL LOW (ref >60)
Glucose, Bld: 106 mg/dL — ABNORMAL HIGH (ref 70–99)
Potassium: 3 mmol/L — ABNORMAL LOW (ref 3.5–5.1)
Sodium: 133 mmol/L — ABNORMAL LOW (ref 135–145)

## 2020-11-21 ENCOUNTER — Other Ambulatory Visit
Admission: RE | Admit: 2020-11-21 | Discharge: 2020-11-21 | Disposition: A | Discharge status: Home / Self Care | Payer: Medicare Other | Source: Ambulatory Visit | Attending: Nephrology | Admitting: Nephrology

## 2020-11-21 DIAGNOSIS — N186 End stage renal disease: Secondary | ICD-10-CM | POA: Insufficient documentation

## 2020-11-21 LAB — RENAL FUNCTION PANEL
Albumin: 3.9 g/dL (ref 3.5–5.0)
Anion gap: 15 (ref 5–15)
BUN: 41 mg/dL — ABNORMAL HIGH (ref 6–20)
CO2: 12 mmol/L — ABNORMAL LOW (ref 22–32)
Calcium: 9.1 mg/dL (ref 8.9–10.3)
Chloride: 104 mmol/L (ref 98–111)
Creatinine, Ser: 12.36 mg/dL — ABNORMAL HIGH (ref 0.61–1.24)
GFR, Estimated: 4 mL/min — ABNORMAL LOW (ref >60)
Glucose, Bld: 85 mg/dL (ref 70–99)
Phosphorus: 5.3 mg/dL — ABNORMAL HIGH (ref 2.5–4.6)
Potassium: 6.9 mmol/L (ref 3.5–5.1)
Sodium: 131 mmol/L — ABNORMAL LOW (ref 135–145)

## 2020-11-24 ENCOUNTER — Other Ambulatory Visit
Admission: RE | Admit: 2020-11-24 | Discharge: 2020-11-24 | Disposition: A | Discharge status: Home / Self Care | Payer: Medicare Other | Source: Other Acute Inpatient Hospital | Attending: Nephrology | Admitting: Nephrology

## 2020-11-24 DIAGNOSIS — N186 End stage renal disease: Secondary | ICD-10-CM | POA: Insufficient documentation

## 2020-11-24 LAB — RENAL FUNCTION PANEL
Albumin: 3.8 g/dL (ref 3.5–5.0)
Anion gap: 15 (ref 5–15)
BUN: 38 mg/dL — ABNORMAL HIGH (ref 6–20)
CO2: 24 mmol/L (ref 22–32)
Calcium: 9.3 mg/dL (ref 8.9–10.3)
Chloride: 96 mmol/L — ABNORMAL LOW (ref 98–111)
Creatinine, Ser: 10.53 mg/dL — ABNORMAL HIGH (ref 0.61–1.24)
GFR, Estimated: 5 mL/min — ABNORMAL LOW (ref >60)
Glucose, Bld: 100 mg/dL — ABNORMAL HIGH (ref 70–99)
Phosphorus: 5.3 mg/dL — ABNORMAL HIGH (ref 2.5–4.6)
Potassium: 5.2 mmol/L — ABNORMAL HIGH (ref 3.5–5.1)
Sodium: 135 mmol/L (ref 135–145)

## 2021-02-25 NOTE — Progress Notes (Incomplete)
02/25/21 3:48 PM   Oscar Moody. [birth year] [id number]  Referring provider:  Danae Orleans, MD [place],  [place] [id number] No chief complaint on file.    HPI: Oscar Moody. is a 51 y.o.male who presents today for further evaluation of possible peyronie's disease.   He was last seen in Urology in 2015 by Dr.Cope.   He has a personal history of polycystic kidney disease, CKD stage IV, and dysuria.   His most recent PSA on 04/03/2020 was 0.36.    PMH: Past Medical History:  Diagnosis Date   Anxiety    Arthritis    Asthma    childhood asthma   Chronic kidney disease    Current every day smoker    DVT (deep venous thrombosis) ([facility])    Dyspnea    with exertion   GERD (gastroesophageal reflux disease)    Headache    Hypercholesteremia    Hypertension    Peripheral vascular disease ([place])     Surgical History: Past Surgical History:  Procedure Laterality Date   AV FISTULA PLACEMENT Right 12/26/2015   Procedure: ARTERIOVENOUS (AV) FISTULA CREATION ( RADIAL CEPHALIC );  Surgeon: Katha Cabal, MD;  Location: [facility] OR[facility];  Service: Vascular;  Laterality: Right;   FRACTURE SURGERY Left    screw in left wrist   PERIPHERAL VASCULAR CATHETERIZATION Right 03/28/2016   Procedure: A/V Fistulagram;  Surgeon: Katha Cabal, MD;  Location: [place] [facility];  Service: Cardiovascular;  Laterality: Right;   PERIPHERAL VASCULAR CATHETERIZATION N/A 03/28/2016   Procedure: A/V Shunt Intervention;  Surgeon: Katha Cabal, MD;  Location: [place] [facility];  Service: Cardiovascular;  Laterality: N/A;   VASCULAR SURGERY Left 07/2015   stent in left thigh, Location:[place]    Home Medications:  Allergies as of 02/26/2021       Reactions   Oxycodone-acetaminophen Itching        Medication List        Accurate as of February 25, 2021  3:48 PM. If you have any questions, ask your nurse or doctor.           amLODipine 10 MG tablet Commonly known as: NORVASC Take 10 mg by mouth daily.   calcium acetate 667 MG capsule Commonly known as: PHOSLO TAKE 3 CAPSULES BY MOUTH THREE TIMES DAILY WITH MEALS AND 1 CAPSULE WITH SNACKS   clopidogrel 75 MG tablet Commonly known as: PLAVIX Take 75 mg by mouth daily.   hydrOXYzine 25 MG tablet Commonly known as: ATARAX Take 0.5 tablets (12.5 mg total) by mouth every 8 (eight) hours as needed for itching.   lisinopril 40 MG tablet Commonly known as: ZESTRIL Take 40 mg by mouth daily.   omeprazole 20 MG tablet Commonly known as: PRILOSEC OTC Take 40 mg by mouth daily.        Allergies:  Allergies  Allergen Reactions   Oxycodone-Acetaminophen Itching    Family History: Family History  Problem Relation Age of Onset   Hypertension Mother    Hypertension Sister     Social History:  reports that he has been smoking cigarettes. He has been smoking an average of .25 packs per day. He has never used smokeless tobacco. He reports current drug use. Drug: Marijuana. He reports that he does not drink alcohol.   Physical Exam: There were no vitals taken for this visit.  Constitutional:  Alert and oriented, No acute distress. HEENT: [place] AT, moist  mucus membranes.  Trachea midline, no masses. Cardiovascular: No clubbing, cyanosis, or edema. Respiratory: Normal respiratory effort, no increased work of breathing. Skin: No rashes, bruises or suspicious lesions. Neurologic: Grossly intact, no focal deficits, moving all 4 extremities. Psychiatric: Normal mood and affect.  Laboratory Data:  Lab Results  Component Value Date   CREATININE 10.53 (H) 11/24/2020   Urinalysis   Pertinent Imaging:   Assessment & Plan:     No follow-ups on file.  I,Kailey Littlejohn,acting as a Education administrator for [name], MD.,have documented all relevant documentation on the behalf of [name], MD,as directed by  [name], MD while in the presence of  [name], [place] [address], [place], [place] [address] [phone]

## 2021-02-26 ENCOUNTER — Ambulatory Visit: Payer: Medicare Other | Admitting: Urology

## 2021-02-27 NOTE — Progress Notes (Signed)
02/28/21 10:37 AM   Oscar Moody. 1970/04/02 [id number]  Referring provider:  Danae Orleans, MD [place],   [id number] Chief Complaint  Patient presents with   Abnormal Penile Curvature     HPI: Oscar Moody. is a 51 y.o.male who presents today for further evaluation of possible peyronie's disease.   He has a personal history of ESRD and polycystic kidney disease/ stage 5 CKD.   He was last seen in urology in 2015 by Dr.Cope.   He reports today that his symptoms have been ongoing for 3-4 months it was sudden onset. He initially noticed his symptoms when he used the restroom and found that his penis curved more to the left. He reports it is hard along the bottom. He estimates the degree of curvature to be about 35-40 degrees and stable.  He denies any pain.  He has experienced an incidence during intercourse where he heard a pop and immediately lost his erection he reports that this happened years ago.  He is not currently sexually active with a male partner due to some medical issues with his wife.  He denies any other ejaculatory issues.  The curvature is bothersome to him.  He has not tried any other treatment or interventions.  He reports that he is circumcised but he has what sounds consistent with redundant foreskin, he is not diabetic.   He has no problem with erections.    SHIM     Row Name 02/28/21 1013         SHIM: Over the last 6 months:   How do you rate your confidence that you could get and keep an erection? High     When you had erections with sexual stimulation, how often were your erections hard enough for penetration (entering your partner)? Almost Always or Always     During sexual intercourse, how often were you able to maintain your erection after you had penetrated (entered) your partner? Most Times (much more than half the time)     During sexual intercourse, how difficult was it to maintain your  erection to completion of intercourse? Not Difficult     When you attempted sexual intercourse, how often was it satisfactory for you? Almost Always or Always       SHIM Total Score   SHIM 23               PMH: Past Medical History:  Diagnosis Date   Anxiety    Arthritis    Asthma    childhood asthma   Chronic kidney disease    Current every day smoker    DVT (deep venous thrombosis) ([facility])    Dyspnea    with exertion   GERD (gastroesophageal reflux disease)    Headache    Hypercholesteremia    Hypertension    Peripheral vascular disease ([place])     Surgical History: Past Surgical History:  Procedure Laterality Date   AV FISTULA PLACEMENT Right 12/26/2015   Procedure: ARTERIOVENOUS (AV) FISTULA CREATION ( RADIAL CEPHALIC );  Surgeon: Katha Cabal, MD;  Location: [facility];  Service: Vascular;  Laterality: Right;   FRACTURE SURGERY Left    screw in left wrist   PERIPHERAL VASCULAR CATHETERIZATION Right 03/28/2016   Procedure: A/V Fistulagram;  Surgeon: Katha Cabal, MD;  Location: [place] [facility];  Service: Cardiovascular;  Laterality: Right;   PERIPHERAL VASCULAR CATHETERIZATION N/A 03/28/2016   Procedure: A/V Shunt  Intervention;  Surgeon: Katha Cabal, MD;  Location: [place] [facility];  Service: Cardiovascular;  Laterality: N/A;   VASCULAR SURGERY Left 07/2015   stent in left thigh, Location:[facility]    Home Medications:  Allergies as of 02/28/2021       Reactions   Oxycodone-acetaminophen Itching        Medication List        Accurate as of February 28, 2021 10:37 AM. If you have any questions, ask your nurse or doctor.          STOP taking these medications    hydrOXYzine 25 MG tablet Commonly known as: ATARAX Stopped by: [name], MD   omeprazole 20 MG tablet Commonly known as: PRILOSEC OTC Stopped by: [name], MD   varenicline 1 MG tablet Commonly known as: CHANTIX Stopped by: [name], MD        TAKE these medications    amLODipine 10 MG tablet Commonly known as: NORVASC Take 10 mg by mouth daily.   baclofen 10 MG tablet Commonly known as: LIORESAL TAKE 1 TABLET (10 MG TOTAL) BY MOUTH TWO (2) TIMES A DAY.   calcium acetate 667 MG capsule Commonly known as: PHOSLO TAKE 3 CAPSULES BY MOUTH THREE TIMES DAILY WITH MEALS AND 1 CAPSULE WITH SNACKS   clopidogrel 75 MG tablet Commonly known as: PLAVIX Take 75 mg by mouth daily.   furosemide 40 MG tablet Commonly known as: LASIX Take 40 mg by mouth 2 (two) times daily.   HYDROcodone-acetaminophen 7.5-325 MG tablet Commonly known as: NORCO Take 1 tablet by mouth every 6 (six) hours as needed.   lisinopril 40 MG tablet Commonly known as: ZESTRIL Take 40 mg by mouth daily.   naloxone 4 MG/0.1ML Liqd nasal spray kit Commonly known as: NARCAN 1 spray as directed.   omeprazole 40 MG capsule Commonly known as: PRILOSEC Take 40 mg by mouth daily.   simvastatin 10 MG tablet Commonly known as: ZOCOR Take 10 mg by mouth daily.   sodium bicarbonate 650 MG tablet Take 650 mg by mouth 2 (two) times daily.        Allergies:  Allergies  Allergen Reactions   Oxycodone-Acetaminophen Itching    Family History: Family History  Problem Relation Age of Onset   Hypertension Mother    Polycystic kidney disease Mother    Hypertension Sister     Social History:  reports that he has been smoking cigarettes. He has been smoking an average of .25 packs per day. He has never used smokeless tobacco. He reports current drug use. Drug: Marijuana. He reports that he does not drink alcohol.   Physical Exam: BP 133/86    Pulse 94    Ht 5' 11"  (1.803 m)    Wt 218 lb (98.9 kg)    BMI 30.40 kg/m   Constitutional:  Alert and oriented, No acute distress. HEENT: [place] AT, moist mucus membranes.  Trachea midline, no masses. Cardiovascular: No clubbing, cyanosis, or edema. Respiratory: Normal respiratory effort, no increased work of  breathing. GU: partially buried penis with slightly redundant residual foreskin.  Palpable penile plaque,  measuring about 1.5 x 0.5 cm at lateral base which was cordlike consistent with a peyronie's plaque .  Fullness in the proximal cords bilaterally right greater than left, suspect probable fat-containing hernias versus cord lipoma Skin: No rashes, bruises or suspicious lesions. Neurologic: Grossly intact, no focal deficits, moving all 4 extremities. Psychiatric: Normal mood and affect.  Laboratory Data:  Lab  Results  Component Value Date   CREATININE 10.53 (H) 11/24/2020   Assessment & Plan:    Peyronie's disease  - Based on patient's history of physical exam, findings are consistent with Peyronie's disease.  Pathophysiology was discussed at length including the 2 phases of the diease, acute and chronic.  Treatment options and goals of treatment were discussed today in detail. Options including observation, penile plaque and graft, penile plication, placement of penile prosthesis, and injection of collagenase were all reviewed.  An benefits of each were discussed at length.  He seems to be most interested in collagenase injections with the medication Xiaflex.  We discussed that this medication is administered in cycles which include 2 injections of the medication into the penile plaque followed by modeling procedure with 6 weeks of home exercises. Goals of treatment were reviewed which include improvement of penile curvature ideally to facilitate sexual function/penetration but likely not complete resolution of the curvature. Risks including failure of the medication, penile hematoma/edema, pain, and penile fracture were all discussed. All of his questions were answered.  He is interested in pursuing collagenase but is concerned about the cost.  He has asked [place] to move forward with insurance investigation.   2. Buried penis  - Exam today showed partially buried penis, discussed with  benefit from weight loss if this is bothersome to him   Gevena Mart Littlejohn,acting as a scribe for [name], MD.,have documented all relevant documentation on the behalf of [name], MD,as directed by  [name], MD while in the presence of [name], MD.  I have reviewed the above documentation for accuracy and completeness, and I agree with the above.   [name], MD  [facility] [address] [place], [place] [address] [phone]  I spent 46 total minutes on the day of the encounter including pre-visit review of the medical record, face-to-face time with the patient, and post visit ordering of labs/imaging/tests.

## 2021-02-28 ENCOUNTER — Ambulatory Visit (INDEPENDENT_AMBULATORY_CARE_PROVIDER_SITE_OTHER): Payer: Medicare Other | Admitting: Urology

## 2021-02-28 ENCOUNTER — Encounter: Payer: Self-pay | Admitting: Urology

## 2021-02-28 ENCOUNTER — Other Ambulatory Visit: Payer: Self-pay

## 2021-02-28 VITALS — BP 133/86 | HR 94 | Ht 71.0 in | Wt 218.0 lb

## 2021-02-28 DIAGNOSIS — N486 Induration penis plastica: Secondary | ICD-10-CM

## 2021-03-01 LAB — URINALYSIS, COMPLETE
Bilirubin, UA: NEGATIVE
Ketones, UA: NEGATIVE
Leukocytes,UA: NEGATIVE
Nitrite, UA: NEGATIVE
Specific Gravity, UA: 1.015 (ref 1.005–1.030)
Urobilinogen, Ur: 0.2 mg/dL (ref 0.2–1.0)
pH, UA: 7.5 (ref 5.0–7.5)

## 2021-03-01 LAB — MICROSCOPIC EXAMINATION
Bacteria, UA: NONE SEEN
Epithelial Cells (non renal): NONE SEEN /hpf (ref 0–10)

## 2021-03-14 ENCOUNTER — Telehealth: Payer: Self-pay | Admitting: *Deleted

## 2021-03-14 NOTE — Telephone Encounter (Signed)
Received PA and notification from Endo will be shipping Xiaflex to office 03/19/21. Will call patient to schedule once received.

## 2021-03-19 ENCOUNTER — Telehealth: Payer: Self-pay | Admitting: *Deleted

## 2021-03-19 NOTE — Telephone Encounter (Signed)
Left Vm to return call 

## 2021-04-01 NOTE — Progress Notes (Signed)
° °  04/02/2021 8:15 AM  Oscar Moody. 01-Oct-1970 [id number]   Referring provider: Danae Orleans, MD [place],  [place] [address]   Chief Complaint  Patient presents with   Other   Urological history: 1. Peyronie's disease -contributing factors of possible penile injury during intercourse several years ago, age, smoking and PVD -plaque 1.5 x 0.5 cm at lateral base  -SHIM 23  2. Polycystic kidney disease  3. ESRD  HPI: Oscar Moody. Is a 51 y.o. male who presents today for an erection induction.    Physical Exam:  BP (!) 150/96    Pulse 98    Ht 6' (1.829 m)    Wt 220 lb (99.8 kg)    BMI 29.84 kg/m   Constitutional:  Well nourished. Alert and oriented, No acute distress. GU: No CVA tenderness.  No bladder fullness or masses.  Patient with circumcised phallus. Urethral meatus is patent.  No penile discharge. No penile lesions or rashes.  Large Peyronie's plaque palpated across the right dorsal side of the penis.   Scrotum without lesions, cysts, rashes and/or edema.   Psychiatric: Normal mood and affect.   Procedure  Patient's right corpus cavernosum is identified.  An area near the base of the penis is cleansed with rubbing alcohol.  Careful to avoid the dorsal vein, 10 mcg of Caverject 20 mcg Lot # [id number] exp # [id number] Sept 30 is injected at a 90 degree angle into the right corpus cavernosum near the base of the penis.  Patient experienced a very firm erection in 15 minutes.         Assessment & Plan:    1. Peyronie's disease -Erection is induced plaque is marked    No follow-ups on file.  Laneta Simmers  [facility] [address] [place], [place] [address] [phone]

## 2021-04-01 NOTE — Progress Notes (Signed)
°  04/02/2021 CC:  Chief Complaint  Patient presents with   Abnormal Penile Curvature     HPI: Oscar Moody. is a 50 y.o. male with a personal history of peyronie's disease, buried penis ESRD, and polycystic kidney disease/ stage 5 CKD, who presents today for Xiaflex.    Vitals:   04/02/21 1312  BP: (!) 162/88  Pulse: 88  NED. A&Ox3.   No respiratory distress   Abd soft, NT, ND Normal phallus with bilateral descended testicles   Procedure: Xiaflex injection   Previously marked penile plaque site  Remained visible with marking pen.  The plaque was easily palpable  . Xiaflex injection (0.58 mg) was then injected directly into the left lateral plaque at the point of maximal curvature at an angle after  to being warm to room temperature using a small TB needle.  He tolerated the procedure very well.   Assessment/ Plan: Peyronie's disease  - Xiaflex injection given in the left lateral plaque today  - Return tomorrow for another Xiaflex injection   Return tomorrow

## 2021-04-02 ENCOUNTER — Encounter: Payer: Self-pay | Admitting: Urology

## 2021-04-02 ENCOUNTER — Ambulatory Visit (INDEPENDENT_AMBULATORY_CARE_PROVIDER_SITE_OTHER): Payer: Medicaid Other | Admitting: Urology

## 2021-04-02 ENCOUNTER — Ambulatory Visit (INDEPENDENT_AMBULATORY_CARE_PROVIDER_SITE_OTHER): Payer: Medicare HMO | Admitting: Urology

## 2021-04-02 ENCOUNTER — Other Ambulatory Visit: Payer: Self-pay

## 2021-04-02 VITALS — BP 150/96 | HR 98 | Ht 72.0 in | Wt 220.0 lb

## 2021-04-02 VITALS — BP 162/88 | HR 88 | Ht 72.0 in | Wt 220.0 lb

## 2021-04-02 DIAGNOSIS — N486 Induration penis plastica: Secondary | ICD-10-CM | POA: Diagnosis not present

## 2021-04-02 MED ORDER — COLLAGENASE CLOSTRID HISTOLYT 0.9 MG IJ SOLR
0.9000 mg | Freq: Once | INTRAMUSCULAR | Status: AC
  Administered 2021-04-02: 0.9 mg via INTRAMUSCULAR

## 2021-04-02 NOTE — Progress Notes (Signed)
° °  04/03/2021 CC:  Chief Complaint  Patient presents with   Abnormal Penile Curvature    HPI: Oscar Moody. is a 51 y.o. male a 52 y.o. male with a personal history of peyronie's disease, buried penis ESRD, and polycystic kidney disease/ stage 5 CKD, who presents today for second Xiaflex.   Vitals:   04/03/21 1309  BP: 134/89  Pulse: 90   NED. A&Ox3.   No respiratory distress   Abd soft, NT, ND Normal phallus with bilateral descended testicles  Procedure: Xiaflex injection   Previously marked penile plaque site  Remained visible with marking pen.  The plaque was easily palpable at the left base with some mild bruising noted from yesterday . Xiaflex injection (0.58 mg) was then injected directly into the plaque at the point of maximal curvature at an angle after  to being warm to room temperature using a small TB needle.  He tolerated the procedure very well.   Return tomorrow for modeling   Conley Rolls as a scribe for [name], MD.,have documented all relevant documentation on the behalf of [name], MD,as directed by  [name], MD while in the presence of [name], MD.  I have reviewed the above documentation for accuracy and completeness, and I agree with the above.   [name], MD

## 2021-04-03 ENCOUNTER — Ambulatory Visit (INDEPENDENT_AMBULATORY_CARE_PROVIDER_SITE_OTHER): Payer: Medicare HMO | Admitting: Urology

## 2021-04-03 ENCOUNTER — Encounter: Payer: Self-pay | Admitting: Urology

## 2021-04-03 VITALS — BP 134/89 | HR 90 | Ht 72.0 in | Wt 220.0 lb

## 2021-04-03 DIAGNOSIS — N486 Induration penis plastica: Secondary | ICD-10-CM

## 2021-04-03 MED ORDER — COLLAGENASE CLOSTRID HISTOLYT 0.9 MG IJ SOLR
0.9000 mg | Freq: Once | INTRAMUSCULAR | Status: AC
  Administered 2021-04-03: 0.9 mg via INTRAMUSCULAR

## 2021-04-04 NOTE — Progress Notes (Signed)
Xiaflex modeling  Patient's Peyronie's plaque is identified and palpated on the right base of the phallus. Wearing gloves, I grasped the plaque about 1 cm proximal and distal to the injection site, making sure to avoid direct pressure on the injection site.  Using the target plaque as a fulcrum point, I used both hands to apply firm, steady pressure to elongate and stretch the plaque.  The penis was bent opposite to the patients penile curvature, with stretching to the point of moderate resistance. I held pressure for 30 seconds then released. After a 60 second rest period, I repeated the penile modeling technique for a total of 3 modeling attempts at 30 seconds for each attempt.  We discussed penile strengthening and bending exercises at home for the next 6 weeks; instruction sheet provided to the patient. He tolerated well with no concerns.

## 2021-04-05 ENCOUNTER — Encounter: Payer: Self-pay | Admitting: Urology

## 2021-04-05 ENCOUNTER — Other Ambulatory Visit: Payer: Self-pay

## 2021-04-05 ENCOUNTER — Ambulatory Visit (INDEPENDENT_AMBULATORY_CARE_PROVIDER_SITE_OTHER): Payer: Medicare HMO | Admitting: Urology

## 2021-04-05 VITALS — BP 126/84 | HR 81 | Ht 72.0 in | Wt 220.0 lb

## 2021-04-05 DIAGNOSIS — N486 Induration penis plastica: Secondary | ICD-10-CM

## 2021-05-13 NOTE — Progress Notes (Signed)
? ?05/14/21 ?12:36 PM  ? ?[place] ?04-Nov-1970 ?[id number] ? ?Referring provider:  ?Danae Orleans, MD ?[address] ?[facility] ?[place],  [place] [address] ?Chief Complaint  ?Patient presents with  ? Abnormal Penile Curvature  ? ? ? ?HPI: ?Oscar Moody. is a 51 y.o.male  with a personal history of peyronie's disease, buried penis ESRD, and polycystic kidney disease/ stage 5 CKD, who presents today for a 5 week follow-up on xiaflex. ? ?He received  Xiaflex 2nd injection, he had 90 degree curvature to the right. on 04/03/2021 and underwent molding on 04/05/2021 with Zara Council, PA-C.  ? ?He reports that he still has curvature. He has had 50% improvement in his curvature. He had not had sexual intercourse but he has gained an erection to see how much his curvature has improved.  ? ?PMH: ?Past Medical History:  ?Diagnosis Date  ? Anxiety   ? Arthritis   ? Asthma   ? childhood asthma  ? Chronic kidney disease   ? Current every day smoker   ? DVT (deep venous thrombosis) ([facility])   ? Dyspnea   ? with exertion  ? GERD (gastroesophageal reflux disease)   ? Headache   ? Hypercholesteremia   ? Hypertension   ? Peripheral vascular disease ([place])   ? ? ?Surgical History: ?Past Surgical History:  ?Procedure Laterality Date  ? AV FISTULA PLACEMENT Right 12/26/2015  ? Procedure: ARTERIOVENOUS (AV) FISTULA CREATION ( RADIAL CEPHALIC );  Surgeon: Katha Cabal, MD;  Location: [facility];  Service: Vascular;  Laterality: Right;  ? FRACTURE SURGERY Left   ? screw in left wrist  ? PERIPHERAL VASCULAR CATHETERIZATION Right 03/28/2016  ? Procedure: A/V Fistulagram;  Surgeon: Katha Cabal, MD;  Location: [facility];  Service: Cardiovascular;  Laterality: Right;  ? PERIPHERAL VASCULAR CATHETERIZATION N/A 03/28/2016  ? Procedure: A/V Shunt Intervention;  Surgeon: Katha Cabal, MD;  Location: [facility];  Service: Cardiovascular;  Laterality: N/A;  ? VASCULAR SURGERY Left 07/2015  ? stent  in left thigh, Location:[facility]  ? ? ?Home Medications:  ?Allergies as of 05/14/2021   ? ?   Reactions  ? Oxycodone-acetaminophen Itching  ? ?  ? ?  ?Medication List  ?  ? ?  ? Accurate as of May 14, 2021 12:36 PM. If you have any questions, ask your nurse or doctor.  ?  ?  ? ?  ? ?amLODipine 10 MG tablet ?Commonly known as: NORVASC ?Take 10 mg by mouth daily. ?  ?baclofen 10 MG tablet ?Commonly known as: LIORESAL ?TAKE 1 TABLET (10 MG TOTAL) BY MOUTH TWO (2) TIMES A DAY. ?  ?calcium acetate 667 MG capsule ?Commonly known as: PHOSLO ?TAKE 3 CAPSULES BY MOUTH THREE TIMES DAILY WITH MEALS AND 1 CAPSULE WITH SNACKS ?  ?clopidogrel 75 MG tablet ?Commonly known as: PLAVIX ?Take 75 mg by mouth daily. ?  ?furosemide 40 MG tablet ?Commonly known as: LASIX ?Take 40 mg by mouth 2 (two) times daily. ?  ?HYDROcodone-acetaminophen 7.5-325 MG tablet ?Commonly known as: NORCO ?Take 1 tablet by mouth every 6 (six) hours as needed. ?  ?lisinopril 40 MG tablet ?Commonly known as: ZESTRIL ?Take 40 mg by mouth daily. ?  ?naloxone 4 MG/0.1ML Liqd nasal spray kit ?Commonly known as: NARCAN ?1 spray as directed. ?  ?omeprazole 40 MG capsule ?Commonly known as: PRILOSEC ?Take 40 mg by mouth daily. ?  ?simvastatin 10 MG tablet ?Commonly known as:  ZOCOR ?Take 10 mg by mouth daily. ?  ?sodium bicarbonate 650 MG tablet ?Take 650 mg by mouth 2 (two) times daily. ?  ?Xiaflex 0.9 MG Solr ?Generic drug: Collagenase Clostrid Histolyt ?  ? ?  ? ? ?Allergies:  ?Allergies  ?Allergen Reactions  ? Oxycodone-Acetaminophen Itching  ? ? ?Family History: ?Family History  ?Problem Relation Age of Onset  ? Hypertension Mother   ? Polycystic kidney disease Mother   ? Hypertension Sister   ? ? ?Social History:  reports that he has been smoking cigarettes. He has been smoking an average of .25 packs per day. He has never used smokeless tobacco. He reports current drug use. Drug: Marijuana. He reports that he does not drink alcohol. ? ? ?Physical Exam: ?BP  (!) 168/83   Pulse 89   Ht 6' (1.829 m)   Wt 210 lb (95.3 kg)   BMI 28.48 kg/m?   ?Constitutional:  Alert and oriented, No acute distress. ?HEENT: [place] AT, moist mucus membranes.  Trachea midline, no masses. ?Cardiovascular: No clubbing, cyanosis, or edema. ?Respiratory: Normal respiratory effort, no increased work of breathing. ?Neurologic: Grossly intact, no focal deficits, moving all 4 extremities. ?Psychiatric: Normal mood and affect. ? ?Laboratory Data: ?Lab Results  ?Component Value Date  ? CREATININE 10.53 (H) 11/24/2020  ? ? ?Assessment & Plan:   ? ?Peyronie's disease  ?- Reports to have a 45%  improvement in his curvature, self reports about a 45 degree curvature this point in time but not completely satisfied with cosmesis, prefers to undergo another round.  We discussed goals of care today and clinical expectations. ?- He is clear to have sexual intercourse/ masturbate ?- He would like to undergo another Xiaflex injection which we can arrange sometime in mid April. ? ?Return for Xiaflex injection  ? ?Conley Rolls as a scribe for [name], MD.,have documented all relevant documentation on the behalf of [name], MD,as directed by  [name], MD while in the presence of [name], MD. ? ?I have reviewed the above documentation for accuracy and completeness, and I agree with the above.  ? ?[name], MD ? ? ?[facility] ?[address], [address] ?[place], [place] [address] ?[phone] ? ?

## 2021-05-14 ENCOUNTER — Ambulatory Visit (INDEPENDENT_AMBULATORY_CARE_PROVIDER_SITE_OTHER): Payer: Medicare HMO | Admitting: Urology

## 2021-05-14 ENCOUNTER — Other Ambulatory Visit: Payer: Self-pay

## 2021-05-14 VITALS — BP 168/83 | HR 89 | Ht 72.0 in | Wt 210.0 lb

## 2021-05-14 DIAGNOSIS — N486 Induration penis plastica: Secondary | ICD-10-CM | POA: Diagnosis not present

## 2021-05-14 NOTE — Patient Instructions (Signed)
Will call to schedule 

## 2021-05-15 ENCOUNTER — Telehealth: Payer: Self-pay | Admitting: *Deleted

## 2021-05-15 NOTE — Telephone Encounter (Signed)
Spoke with [facility] for refills for Xialfex to begin 2nd cycle-will initiate process and contact patient. ?

## 2021-06-13 ENCOUNTER — Telehealth: Payer: Self-pay | Admitting: *Deleted

## 2021-06-13 DIAGNOSIS — E669 Obesity, unspecified: Secondary | ICD-10-CM | POA: Insufficient documentation

## 2021-06-13 NOTE — Telephone Encounter (Signed)
Called patient left Vm to return my call  ?

## 2021-06-14 NOTE — Telephone Encounter (Signed)
Left Vm to return call regarding 2nd cycle of Xiaflex. [facility] has been trying to reach patient.  ?

## 2021-06-14 NOTE — Telephone Encounter (Signed)
Pt returned your call and will call [facility]. ?

## 2021-07-02 ENCOUNTER — Other Ambulatory Visit (INDEPENDENT_AMBULATORY_CARE_PROVIDER_SITE_OTHER): Payer: Self-pay | Admitting: Nurse Practitioner

## 2021-07-02 DIAGNOSIS — I739 Peripheral vascular disease, unspecified: Secondary | ICD-10-CM

## 2021-07-03 ENCOUNTER — Telehealth (INDEPENDENT_AMBULATORY_CARE_PROVIDER_SITE_OTHER): Payer: Self-pay

## 2021-07-03 NOTE — Telephone Encounter (Signed)
Per [facility] teams message, [phone] this patient left a message for a call back.... he has an appt on 07/04/21 in office .I returned call to pt. LVM for him to call us back. I advised that I was leaving for the day and if he tried to use the caller ID #, I would not be here. I advised to call main # of [phone].  ?

## 2021-07-04 ENCOUNTER — Encounter (INDEPENDENT_AMBULATORY_CARE_PROVIDER_SITE_OTHER): Payer: Self-pay | Admitting: Vascular Surgery

## 2021-07-04 ENCOUNTER — Ambulatory Visit (INDEPENDENT_AMBULATORY_CARE_PROVIDER_SITE_OTHER): Payer: Medicare Other

## 2021-07-04 ENCOUNTER — Ambulatory Visit (INDEPENDENT_AMBULATORY_CARE_PROVIDER_SITE_OTHER): Payer: Medicare Other | Admitting: Vascular Surgery

## 2021-07-04 VITALS — BP 156/90 | HR 87 | Resp 17 | Ht 72.0 in | Wt 225.0 lb

## 2021-07-04 DIAGNOSIS — I70211 Atherosclerosis of native arteries of extremities with intermittent claudication, right leg: Secondary | ICD-10-CM

## 2021-07-04 DIAGNOSIS — I739 Peripheral vascular disease, unspecified: Secondary | ICD-10-CM

## 2021-07-04 DIAGNOSIS — N186 End stage renal disease: Secondary | ICD-10-CM

## 2021-07-04 DIAGNOSIS — J45909 Unspecified asthma, uncomplicated: Secondary | ICD-10-CM

## 2021-07-04 DIAGNOSIS — E782 Mixed hyperlipidemia: Secondary | ICD-10-CM

## 2021-07-04 DIAGNOSIS — I10 Essential (primary) hypertension: Secondary | ICD-10-CM

## 2021-07-04 DIAGNOSIS — Z992 Dependence on renal dialysis: Secondary | ICD-10-CM

## 2021-07-04 NOTE — H&P (View-Only) (Signed)
MRN : [id number]  Oscar Moody. is a 51 y.o. (11-17-1970) male who presents with chief complaint of check circulation.  History of Present Illness:    The patient is seen for evaluation of painful lower right lower extremity and diminished pulses. Patient notes the pain is always associated with activity and is very consistent day today. Typically, the pain occurs at less than one block, progress is as activity continues to the point that the patient must stop walking. Resting including standing still for several minutes allows the patient to walk a similar distance before being forced to stop again. Uneven terrain and inclines shorten the distance. The pain has been progressive over the past several years. The patient denies any abrupt changes in claudication symptoms.  The patient states the inability to walk is causing problems with daily activities.  He is s/p left leg stenting at [facility] and his right leg is now worse and limiting his activity  The patient denies rest pain or dangling of an extremity off the side of the bed during the night for relief. No open wounds or sores at this time.  No history of back problems or DJD of the lumbar sacral spine.   The patient's blood pressure has been stable and relatively well controlled. The patient denies amaurosis fugax or recent TIA symptoms. There are no recent neurological changes noted. The patient denies history of DVT, PE or superficial thrombophlebitis. The patient denies recent episodes of angina or shortness of breath.    Current Meds  Medication Sig   amLODipine (NORVASC) 10 MG tablet Take 10 mg by mouth daily.   calcium acetate (PHOSLO) 667 MG capsule TAKE 3 CAPSULES BY MOUTH THREE TIMES DAILY WITH MEALS AND 1 CAPSULE WITH SNACKS   clopidogrel (PLAVIX) 75 MG tablet Take 75 mg by mouth daily.   furosemide (LASIX) 40 MG tablet Take 40 mg by mouth 2 (two) times daily.   HYDROcodone-acetaminophen (NORCO) 7.5-325 MG tablet Take  1 tablet by mouth every 6 (six) hours as needed.   lisinopril (PRINIVIL,ZESTRIL) 40 MG tablet Take 40 mg by mouth daily.   naloxone (NARCAN) nasal spray 4 mg/0.1 mL 1 spray as directed.   omeprazole (PRILOSEC) 40 MG capsule Take 40 mg by mouth daily.   simvastatin (ZOCOR) 10 MG tablet Take 10 mg by mouth daily.   sodium bicarbonate 650 MG tablet Take 650 mg by mouth 2 (two) times daily.   XIAFLEX 0.9 MG SOLR     Past Medical History:  Diagnosis Date   Anxiety    Arthritis    Asthma    childhood asthma   Chronic kidney disease    Current every day smoker    DVT (deep venous thrombosis) ([facility])    Dyspnea    with exertion   GERD (gastroesophageal reflux disease)    Headache    Hypercholesteremia    Hypertension    Peripheral vascular disease ([place])     Past Surgical History:  Procedure Laterality Date   AV FISTULA PLACEMENT Right 12/26/2015   Procedure: ARTERIOVENOUS (AV) FISTULA CREATION ( RADIAL CEPHALIC );  Surgeon: Katha Cabal, MD;  Location: [facility];  Service: Vascular;  Laterality: Right;   FRACTURE SURGERY Left    screw in left wrist   PERIPHERAL VASCULAR CATHETERIZATION Right 03/28/2016   Procedure: A/V Fistulagram;  Surgeon: Katha Cabal, MD;  Location: [facility];  Service: Cardiovascular;  Laterality: Right;   PERIPHERAL VASCULAR CATHETERIZATION N/A  03/28/2016   Procedure: A/V Shunt Intervention;  Surgeon: Katha Cabal, MD;  Location: [facility];  Service: Cardiovascular;  Laterality: N/A;   VASCULAR SURGERY Left 07/2015   stent in left thigh, Location:[facility]    Social History Social History   Tobacco Use   Smoking status: Every Day    Packs/day: 0.25    Types: Cigarettes   Smokeless tobacco: Never  Vaping Use   Vaping Use: Never used  Substance Use Topics   Alcohol use: No   Drug use: Yes    Types: Marijuana    Comment: occassional (when out of pain medication)    Family History Family History  Problem Relation  Age of Onset   Hypertension Mother    Polycystic kidney disease Mother    Hypertension Sister     Allergies  Allergen Reactions   Oxycodone-Acetaminophen Itching     REVIEW OF SYSTEMS (Negative unless checked)  Constitutional: [] Weight loss  [] Fever  [] Chills Cardiac: [] Chest pain   [] Chest pressure   [] Palpitations   [] Shortness of breath when laying flat   [] Shortness of breath with exertion. Vascular:  [x] Pain in legs with walking   [] Pain in legs at rest  [] History of DVT   [] Phlebitis   [] Swelling in legs   [] Varicose veins   [] Non-healing ulcers Pulmonary:   [] Uses home oxygen   [] Productive cough   [] Hemoptysis   [] Wheeze  [] COPD   [] Asthma Neurologic:  [] Dizziness   [] Seizures   [] History of stroke   [] History of TIA  [] Aphasia   [] Vissual changes   [] Weakness or numbness in arm   [] Weakness or numbness in leg Musculoskeletal:   [] Joint swelling   [] Joint pain   [] Low back pain Hematologic:  [] Easy bruising  [] Easy bleeding   [] Hypercoagulable state   [] Anemic Gastrointestinal:  [] Diarrhea   [] Vomiting  [] Gastroesophageal reflux/heartburn   [] Difficulty swallowing. Genitourinary:  [] Chronic kidney disease   [] Difficult urination  [] Frequent urination   [] Blood in urine Skin:  [] Rashes   [] Ulcers  Psychological:  [] History of anxiety   []  History of major depression.  Physical Examination  Vitals:   07/04/21 0907  BP: (!) 156/90  Pulse: 87  Resp: 17  Weight: 225 lb (102.1 kg)  Height: 6' (1.829 m)   Body mass index is 30.52 kg/m. Gen: WD/WN, NAD Head: [place]/AT, No temporalis wasting.  Ear/Nose/Throat: Hearing grossly intact, nares w/o erythema or drainage Eyes: PER, EOMI, sclera nonicteric.  Neck: Supple, no masses.  No bruit or JVD.  Pulmonary:  Good air movement, no audible wheezing, no use of accessory muscles.  Cardiac: RRR, normal S1, S2, no Murmurs. 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 No guarding/no peritoneal signs.  Musculoskeletal: M/S 5/5 throughout.  No visible deformity.  Neurologic: CN 2-12 intact. Pain and light touch intact in extremities.  Symmetrical.  Speech is fluent. Motor exam as listed above. Psychiatric: Judgment intact, Mood & affect appropriate for pt's clinical situation. Dermatologic: No rashes or ulcers noted.  No changes consistent with cellulitis.   CBC Lab Results  Component Value Date   WBC 7.1 11/17/2020   HGB 14.7 11/17/2020   HCT 48.5 11/17/2020   MCV 90.0 11/17/2020   PLT 209 11/17/2020    BMET    Component Value  Date/Time   NA 135 11/24/2020 0700   K 5.2 (H) 11/24/2020 0700   CL 96 (L) 11/24/2020 0700   CO2 24 11/24/2020 0700   GLUCOSE 100 (H) 11/24/2020 0700   BUN 38 (H) 11/24/2020 0700   CREATININE 10.53 (H) 11/24/2020 0700   CALCIUM 9.3 11/24/2020 0700   GFRNONAA 5 (L) 11/24/2020 0700   GFRAA 12 (L) 03/28/2016 0941   CrCl cannot be calculated (Patient's most recent lab result is older than the maximum 21 days allowed.).  COAG Lab Results  Component Value Date   INR 1.06 12/11/2015    Radiology No results found.   Assessment/Plan 1. Atherosclerosis of native artery of right lower extremity with intermittent claudication (HCC) Recommend:  The patient has experienced increased claudication symptoms and is now describing lifestyle limiting claudication and appears to be having mild rest pain symptroms.  Given the severity of the patient's severe right lower extremity symptoms the patient should undergo angiography with the hope for intervention.  Risk and benefits were reviewed the patient.  Indications for the procedure were reviewed.  All questions were answered, the patient agrees to proceed with right lower extremity angiography and possible  intervention.   The patient should continue walking and begin a more formal exercise program.  The patient should continue antiplatelet therapy and aggressive treatment of the lipid abnormalities  The patient will follow up with me after the angiogram.    2. Essential hypertension Continue antihypertensive medications as already ordered, these medications have been reviewed and there are no changes at this time.   3. ESRD on dialysis [facility]) Recommend:  The patient is doing well and currently has adequate dialysis access. The patient's dialysis center is not reporting any access issues. Flow pattern is stable when compared to the prior ultrasound.  The patient should have a duplex ultrasound of the dialysis access in 6 months. The patient will follow-up with me in the office after each ultrasound     4. Uncomplicated asthma, unspecified asthma severity, unspecified whether persistent Continue pulmonary medications and aerosols as already ordered, these medications have been reviewed and there are no changes at this time.    5. Mixed hyperlipidemia Continue statin as ordered and reviewed, no changes at this time     Hortencia Pilar, MD  07/04/2021 9:35 AM

## 2021-07-04 NOTE — Progress Notes (Signed)
? ? ? ? ?MRN : [id number] ? ?Oscar Moody. is a 51 y.o. (09/10/1970) male who presents with chief complaint of check circulation. ? ?History of Present Illness:  ? ? The patient is seen for evaluation of painful lower right lower extremity and diminished pulses. Patient notes the pain is always associated with activity and is very consistent day today. Typically, the pain occurs at less than one block, progress is as activity continues to the point that the patient must stop walking. Resting including standing still for several minutes allows the patient to walk a similar distance before being forced to stop again. Uneven terrain and inclines shorten the distance. The pain has been progressive over the past several years. The patient denies any abrupt changes in claudication symptoms.  The patient states the inability to walk is causing problems with daily activities. ? ?He is s/p left leg stenting at [facility] and his right leg is now worse and limiting his activity ? ?The patient denies rest pain or dangling of an extremity off the side of the bed during the night for relief. ?No open wounds or sores at this time. ? ?No history of back problems or DJD of the lumbar sacral spine.  ? ?The patient's blood pressure has been stable and relatively well controlled. ?The patient denies amaurosis fugax or recent TIA symptoms. There are no recent neurological changes noted. ?The patient denies history of DVT, PE or superficial thrombophlebitis. ?The patient denies recent episodes of angina or shortness of breath.   ? ?Current Meds  ?Medication Sig  ? amLODipine (NORVASC) 10 MG tablet Take 10 mg by mouth daily.  ? calcium acetate (PHOSLO) 667 MG capsule TAKE 3 CAPSULES BY MOUTH THREE TIMES DAILY WITH MEALS AND 1 CAPSULE WITH SNACKS  ? clopidogrel (PLAVIX) 75 MG tablet Take 75 mg by mouth daily.  ? furosemide (LASIX) 40 MG tablet Take 40 mg by mouth 2 (two) times daily.  ? HYDROcodone-acetaminophen (NORCO) 7.5-325 MG tablet Take  1 tablet by mouth every 6 (six) hours as needed.  ? lisinopril (PRINIVIL,ZESTRIL) 40 MG tablet Take 40 mg by mouth daily.  ? naloxone (NARCAN) nasal spray 4 mg/0.1 mL 1 spray as directed.  ? omeprazole (PRILOSEC) 40 MG capsule Take 40 mg by mouth daily.  ? simvastatin (ZOCOR) 10 MG tablet Take 10 mg by mouth daily.  ? sodium bicarbonate 650 MG tablet Take 650 mg by mouth 2 (two) times daily.  ? XIAFLEX 0.9 MG SOLR   ? ? ?Past Medical History:  ?Diagnosis Date  ? Anxiety   ? Arthritis   ? Asthma   ? childhood asthma  ? Chronic kidney disease   ? Current every day smoker   ? DVT (deep venous thrombosis) ([place])   ? Dyspnea   ? with exertion  ? GERD (gastroesophageal reflux disease)   ? Headache   ? Hypercholesteremia   ? Hypertension   ? Peripheral vascular disease ([place])   ? ? ?Past Surgical History:  ?Procedure Laterality Date  ? AV FISTULA PLACEMENT Right 12/26/2015  ? Procedure: ARTERIOVENOUS (AV) FISTULA CREATION ( RADIAL CEPHALIC );  Surgeon: Katha Cabal, MD;  Location: [facility];  Service: Vascular;  Laterality: Right;  ? FRACTURE SURGERY Left   ? screw in left wrist  ? PERIPHERAL VASCULAR CATHETERIZATION Right 03/28/2016  ? Procedure: A/V Fistulagram;  Surgeon: Katha Cabal, MD;  Location: [place] [facility];  Service: Cardiovascular;  Laterality: Right;  ? PERIPHERAL VASCULAR CATHETERIZATION N/A  03/28/2016  ? Procedure: A/V Shunt Intervention;  Surgeon: Katha Cabal, MD;  Location: [place] [facility];  Service: Cardiovascular;  Laterality: N/A;  ? VASCULAR SURGERY Left 07/2015  ? stent in left thigh, Location:[place]  ? ? ?Social History ?Social History  ? ?Tobacco Use  ? Smoking status: Every Day  ?  Packs/day: 0.25  ?  Types: Cigarettes  ? Smokeless tobacco: Never  ?Vaping Use  ? Vaping Use: Never used  ?Substance Use Topics  ? Alcohol use: No  ? Drug use: Yes  ?  Types: Marijuana  ?  Comment: occassional (when out of pain medication)  ? ? ?Family History ?Family History  ?Problem Relation  Age of Onset  ? Hypertension Mother   ? Polycystic kidney disease Mother   ? Hypertension Sister   ? ? ?Allergies  ?Allergen Reactions  ? Oxycodone-Acetaminophen Itching  ? ? ? ?REVIEW OF SYSTEMS (Negative unless checked) ? ?Constitutional: [] Weight loss  [] Fever  [] Chills ?Cardiac: [] Chest pain   [] Chest pressure   [] Palpitations   [] Shortness of breath when laying flat   [] Shortness of breath with exertion. ?Vascular:  [x] Pain in legs with walking   [] Pain in legs at rest  [] History of DVT   [] Phlebitis   [] Swelling in legs   [] Varicose veins   [] Non-healing ulcers ?Pulmonary:   [] Uses home oxygen   [] Productive cough   [] Hemoptysis   [] Wheeze  [] COPD   [] Asthma ?Neurologic:  [] Dizziness   [] Seizures   [] History of stroke   [] History of TIA  [] Aphasia   [] Vissual changes   [] Weakness or numbness in arm   [] Weakness or numbness in leg ?Musculoskeletal:   [] Joint swelling   [] Joint pain   [] Low back pain ?Hematologic:  [] Easy bruising  [] Easy bleeding   [] Hypercoagulable state   [] Anemic ?Gastrointestinal:  [] Diarrhea   [] Vomiting  [] Gastroesophageal reflux/heartburn   [] Difficulty swallowing. ?Genitourinary:  [] Chronic kidney disease   [] Difficult urination  [] Frequent urination   [] Blood in urine ?Skin:  [] Rashes   [] Ulcers  ?Psychological:  [] History of anxiety   []  History of major depression. ? ?Physical Examination ? ?Vitals:  ? 07/04/21 0907  ?BP: (!) 156/90  ?Pulse: 87  ?Resp: 17  ?Weight: 225 lb (102.1 kg)  ?Height: 6' (1.829 m)  ? ?Body mass index is 30.52 kg/m?. ?Gen: WD/WN, NAD ?Head: [place]/AT, No temporalis wasting.  ?Ear/Nose/Throat: Hearing grossly intact, nares w/o erythema or drainage ?Eyes: PER, EOMI, sclera nonicteric.  ?Neck: Supple, no masses.  No bruit or JVD.  ?Pulmonary:  Good air movement, no audible wheezing, no use of accessory muscles.  ?Cardiac: RRR, normal S1, S2, no Murmurs. 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 No guarding/no peritoneal signs.  ?Musculoskeletal: M/S 5/5 throughout.  No visible deformity.  ?Neurologic: CN 2-12 intact. Pain and light touch intact in extremities.  Symmetrical.  Speech is fluent. Motor exam as listed above. ?Psychiatric: Judgment intact, Mood & affect appropriate for pt's clinical situation. ?Dermatologic: No rashes or ulcers noted.  No changes consistent with cellulitis. ? ? ?CBC ?Lab Results  ?Component Value Date  ? WBC 7.1 11/17/2020  ? HGB 14.7 11/17/2020  ? HCT 48.5 11/17/2020  ? MCV 90.0 11/17/2020  ? PLT 209 11/17/2020  ? ? ?BMET ?   ?Component Value  Date/Time  ? NA 135 11/24/2020 0700  ? K 5.2 (H) 11/24/2020 0700  ? CL 96 (L) 11/24/2020 0700  ? CO2 24 11/24/2020 0700  ? GLUCOSE 100 (H) 11/24/2020 0700  ? BUN 38 (H) 11/24/2020 0700  ? CREATININE 10.53 (H) 11/24/2020 0700  ? CALCIUM 9.3 11/24/2020 0700  ? GFRNONAA 5 (L) 11/24/2020 0700  ? GFRAA 12 (L) 03/28/2016 0941  ? ?CrCl cannot be calculated (Patient's most recent lab result is older than the maximum 21 days allowed.). ? ?COAG ?Lab Results  ?Component Value Date  ? INR 1.06 12/11/2015  ? ? ?Radiology ?No results found. ? ? ?Assessment/Plan ?1. Atherosclerosis of native artery of right lower extremity with intermittent claudication ([place]) ?Recommend: ? ?The patient has experienced increased claudication symptoms and is now describing lifestyle limiting claudication and appears to be having mild rest pain symptroms. ? ?Given the severity of the patient's severe right lower extremity symptoms the patient should undergo angiography with the hope for intervention.  Risk and benefits were reviewed the patient.  Indications for the procedure were reviewed.  All questions were answered, the patient agrees to proceed with right lower extremity angiography and possible  intervention.  ? ?The patient should continue walking and begin a more formal exercise program.  ?The patient should continue antiplatelet therapy and aggressive treatment of the lipid abnormalities ? ?The patient w

## 2021-07-06 DIAGNOSIS — E785 Hyperlipidemia, unspecified: Secondary | ICD-10-CM | POA: Insufficient documentation

## 2021-07-06 DIAGNOSIS — J45909 Unspecified asthma, uncomplicated: Secondary | ICD-10-CM | POA: Insufficient documentation

## 2021-07-06 DIAGNOSIS — I70219 Atherosclerosis of native arteries of extremities with intermittent claudication, unspecified extremity: Secondary | ICD-10-CM | POA: Insufficient documentation

## 2021-07-12 ENCOUNTER — Telehealth (INDEPENDENT_AMBULATORY_CARE_PROVIDER_SITE_OTHER): Payer: Self-pay

## 2021-07-12 NOTE — Telephone Encounter (Signed)
I attempted to contact the patient to schedule a RLE angio with Dr. Delana Meyer. A message was left for a return call.

## 2021-07-17 ENCOUNTER — Telehealth (INDEPENDENT_AMBULATORY_CARE_PROVIDER_SITE_OTHER): Payer: Self-pay

## 2021-07-17 NOTE — Telephone Encounter (Signed)
Spoke with the patient and he was offered 07/23/21 and declined. Patient is scheduled with Dr. Delana Meyer for a RLE angio on 07/30/21 with a 6:45 am arrival time to the MM. Pre-procedure instructions were discussed and will be mailed.

## 2021-07-26 ENCOUNTER — Telehealth: Payer: Self-pay | Admitting: *Deleted

## 2021-07-26 NOTE — Telephone Encounter (Signed)
Provided information-Xiaflex shipment will arrive 07/30/21

## 2021-07-26 NOTE — Telephone Encounter (Signed)
[facility] left message on triage VM, wanting to discuss pt's Xiaflex.

## 2021-07-30 ENCOUNTER — Other Ambulatory Visit: Payer: Self-pay

## 2021-07-30 ENCOUNTER — Encounter: Payer: Self-pay | Admitting: Vascular Surgery

## 2021-07-30 ENCOUNTER — Ambulatory Visit
Admission: RE | Admit: 2021-07-30 | Discharge: 2021-07-30 | Disposition: A | Discharge status: Home / Self Care | Payer: Medicare Other | Attending: Vascular Surgery | Admitting: Vascular Surgery

## 2021-07-30 ENCOUNTER — Encounter
Admission: RE | Disposition: A | Discharge status: Home / Self Care | Payer: Self-pay | Source: Home / Self Care | Attending: Vascular Surgery

## 2021-07-30 DIAGNOSIS — I120 Hypertensive chronic kidney disease with stage 5 chronic kidney disease or end stage renal disease: Secondary | ICD-10-CM | POA: Diagnosis not present

## 2021-07-30 DIAGNOSIS — J45909 Unspecified asthma, uncomplicated: Secondary | ICD-10-CM | POA: Diagnosis not present

## 2021-07-30 DIAGNOSIS — N186 End stage renal disease: Secondary | ICD-10-CM | POA: Insufficient documentation

## 2021-07-30 DIAGNOSIS — I70211 Atherosclerosis of native arteries of extremities with intermittent claudication, right leg: Secondary | ICD-10-CM | POA: Insufficient documentation

## 2021-07-30 DIAGNOSIS — Z992 Dependence on renal dialysis: Secondary | ICD-10-CM | POA: Diagnosis not present

## 2021-07-30 DIAGNOSIS — E782 Mixed hyperlipidemia: Secondary | ICD-10-CM | POA: Insufficient documentation

## 2021-07-30 DIAGNOSIS — I70219 Atherosclerosis of native arteries of extremities with intermittent claudication, unspecified extremity: Secondary | ICD-10-CM

## 2021-07-30 DIAGNOSIS — F17210 Nicotine dependence, cigarettes, uncomplicated: Secondary | ICD-10-CM | POA: Diagnosis not present

## 2021-07-30 HISTORY — PX: LOWER EXTREMITY ANGIOGRAPHY: CATH118251

## 2021-07-30 LAB — CREATININE, SERUM
Creatinine, Ser: 8.58 mg/dL — ABNORMAL HIGH (ref 0.61–1.24)
GFR, Estimated: 7 mL/min — ABNORMAL LOW (ref >60)

## 2021-07-30 LAB — BUN: BUN: 32 mg/dL — ABNORMAL HIGH (ref 6–20)

## 2021-07-30 LAB — POTASSIUM: Potassium: 4.8 mmol/L (ref 3.5–5.1)

## 2021-07-30 SURGERY — LOWER EXTREMITY ANGIOGRAPHY
Anesthesia: Moderate Sedation | Site: Leg Lower | Laterality: Right

## 2021-07-30 MED ORDER — CEFAZOLIN SODIUM-DEXTROSE 1-4 GM/50ML-% IV SOLN
INTRAVENOUS | Status: DC | PRN
  Administered 2021-07-30: 1 g via INTRAVENOUS

## 2021-07-30 MED ORDER — FAMOTIDINE 20 MG PO TABS: 40.0000 mg | ORAL_TABLET | Freq: Once | ORAL | Status: DC | PRN

## 2021-07-30 MED ORDER — MIDAZOLAM HCL 2 MG/2ML IJ SOLN
INTRAMUSCULAR | Status: DC | PRN
  Administered 2021-07-30: 2 mg via INTRAVENOUS
  Administered 2021-07-30: 1 mg via INTRAVENOUS

## 2021-07-30 MED ORDER — FENTANYL CITRATE (PF) 100 MCG/2ML IJ SOLN
INTRAMUSCULAR | Status: DC | PRN
Start: 2021-07-30 — End: 2021-07-30
  Administered 2021-07-30: 50 ug via INTRAVENOUS
  Administered 2021-07-30: 25 ug via INTRAVENOUS

## 2021-07-30 MED ORDER — DIPHENHYDRAMINE HCL 50 MG/ML IJ SOLN: 50.0000 mg | Freq: Once | INTRAMUSCULAR | Status: DC | PRN

## 2021-07-30 MED ORDER — CEFAZOLIN SODIUM-DEXTROSE 1-4 GM/50ML-% IV SOLN: 1.0000 g | INTRAVENOUS | Status: DC

## 2021-07-30 MED ORDER — CEFAZOLIN SODIUM-DEXTROSE 2-4 GM/100ML-% IV SOLN: 2.0000 g | INTRAVENOUS | Status: DC

## 2021-07-30 MED ORDER — FENTANYL CITRATE (PF) 100 MCG/2ML IJ SOLN
INTRAMUSCULAR | Status: AC
  Filled 2021-07-30: qty 2

## 2021-07-30 MED ORDER — METHYLPREDNISOLONE SODIUM SUCC 125 MG IJ SOLR: 125.0000 mg | Freq: Once | INTRAMUSCULAR | Status: DC | PRN

## 2021-07-30 MED ORDER — SODIUM CHLORIDE 0.9 % IV SOLN: INTRAVENOUS | Status: DC

## 2021-07-30 MED ORDER — METHYLPREDNISOLONE SODIUM SUCC 125 MG IJ SOLR
INTRAMUSCULAR | Status: AC
  Filled 2021-07-30: qty 2

## 2021-07-30 MED ORDER — HYDROMORPHONE HCL 1 MG/ML IJ SOLN: 1.0000 mg | Freq: Once | INTRAMUSCULAR | Status: DC | PRN

## 2021-07-30 MED ORDER — MIDAZOLAM HCL 5 MG/5ML IJ SOLN
INTRAMUSCULAR | Status: AC
  Filled 2021-07-30: qty 5

## 2021-07-30 MED ORDER — HEPARIN SODIUM (PORCINE) 1000 UNIT/ML IJ SOLN
INTRAMUSCULAR | Status: DC | PRN
  Administered 2021-07-30: 5000 [IU] via INTRAVENOUS

## 2021-07-30 MED ORDER — MIDAZOLAM HCL 2 MG/ML PO SYRP: 8.0000 mg | ORAL_SOLUTION | Freq: Once | ORAL | Status: DC | PRN

## 2021-07-30 MED ORDER — IODIXANOL 320 MG/ML IV SOLN
INTRAVENOUS | Status: DC | PRN
  Administered 2021-07-30: 65 mL via INTRA_ARTERIAL

## 2021-07-30 MED ORDER — ONDANSETRON HCL 4 MG/2ML IJ SOLN: 4.0000 mg | Freq: Four times a day (QID) | INTRAMUSCULAR | Status: DC | PRN

## 2021-07-30 MED ORDER — ASPIRIN 81 MG PO TBEC: 81.0000 mg | DELAYED_RELEASE_TABLET | Freq: Every day | ORAL | 2 refills | Status: AC

## 2021-07-30 MED ORDER — HEPARIN SODIUM (PORCINE) 1000 UNIT/ML IJ SOLN
INTRAMUSCULAR | Status: AC
  Filled 2021-07-30: qty 10

## 2021-07-30 MED ORDER — METHYLPREDNISOLONE SODIUM SUCC 125 MG IJ SOLR
INTRAMUSCULAR | Status: DC | PRN
  Administered 2021-07-30: 125 mg via INTRAVENOUS

## 2021-07-30 SURGICAL SUPPLY — 18 items
BALLN LUTONIX DCB 6X80X130 (BALLOONS) ×2
BALLOON LUTONIX DCB 6X80X130 (BALLOONS) IMPLANT
CATH ANGIO 5F PIGTAIL 65CM (CATHETERS) ×1 IMPLANT
CATH VERT 5FR 125CM (CATHETERS) ×1 IMPLANT
COVER PROBE U/S 5X48 (MISCELLANEOUS) ×1 IMPLANT
DEVICE STARCLOSE SE CLOSURE (Vascular Products) ×1 IMPLANT
GLIDEWIRE ADV .035X260CM (WIRE) ×1 IMPLANT
KIT ENCORE 26 ADVANTAGE (KITS) ×1 IMPLANT
NDL ENTRY 21GA 7CM ECHOTIP (NEEDLE) IMPLANT
NEEDLE ENTRY 21GA 7CM ECHOTIP (NEEDLE) ×2 IMPLANT
PACK ANGIOGRAPHY (CUSTOM PROCEDURE TRAY) ×2 IMPLANT
SET INTRO CAPELLA COAXIAL (SET/KITS/TRAYS/PACK) ×1 IMPLANT
SHEATH BRITE TIP 5FRX11 (SHEATH) ×1 IMPLANT
SHEATH RAABE 6FR (SHEATH) ×1 IMPLANT
STENT LIFESTENT 5F 7X80X135 (Permanent Stent) ×1 IMPLANT
SYR MEDRAD MARK 7 150ML (SYRINGE) ×1 IMPLANT
TUBING CONTRAST HIGH PRESS 72 (TUBING) ×1 IMPLANT
WIRE GUIDERIGHT .035X150 (WIRE) ×1 IMPLANT

## 2021-07-30 NOTE — Op Note (Signed)
[facility]  Percutaneous Study/Intervention Procedural Note   Date of Surgery: 07/30/2021  Surgeon:  Katha Cabal, MD.  Pre-operative Diagnosis: Atherosclerotic occlusive disease bilateral lower extremities with lifestyle limiting claudication of the right lower extremity  Post-operative diagnosis:  Same  Procedure(s) Performed:             1.  Introduction catheter into right lower extremity 3rd order catheter placement              2.    Contrast injection right lower extremity for distal runoff             3.  Percutaneous transluminal angioplasty and stent placement right superficial femoral artery and popliteal             4.  Star close closure left common femoral arteriotomy  Anesthesia: Conscious sedation was administered under my direct supervision by the interventional radiology RN. IV Versed plus fentanyl were utilized. Continuous ECG, pulse oximetry and blood pressure was monitored throughout the entire procedure.  Conscious sedation was for a total of 39 minutes.  Sheath: 6 [place] Rabie left common femoral retrograde  Contrast: 65 cc  Fluoroscopy Time: 4.5 minutes  Indications:  [name]. presents with lifestyle limiting claudication.  Within the past year he had undergone treatment using interventional techniques of his left lower extremity.  That has improved his ambulation to such a great extent that he now is very insistent on correcting his right leg.  The risks and benefits are reviewed all questions answered patient agrees to proceed.  Procedure:  Oscar Moody. is a 51 y.o. y.o. male who was identified and appropriate procedural time out was performed.  The patient was then placed supine on the table and prepped and draped in the usual sterile fashion.    Ultrasound was placed in the sterile sleeve and the left groin was evaluated the left common femoral artery was echolucent and pulsatile indicating patency.  Image was  recorded for the permanent record and under real-time visualization a microneedle was inserted into the common femoral artery microwire followed by a micro-sheath.  A J-wire was then advanced through the micro-sheath and a  5 [place] sheath was then inserted over a J-wire. J-wire was then advanced and a 5 French pigtail catheter was positioned at the level of T12. AP projection of the aorta was then obtained. Pigtail catheter was repositioned to above the bifurcation and a LAO view of the pelvis was obtained.  Subsequently a pigtail catheter with the stiff angle Glidewire was used to cross the aortic bifurcation the catheter wire were advanced down into the right distal external iliac artery. Oblique view of the femoral bifurcation was then obtained and subsequently the wire was reintroduced and the pigtail catheter negotiated into the SFA representing third order catheter placement. Distal runoff was then performed.  Diagnostic interpretation: The abdominal aorta is opacified with a bolus injection contrast, diffuse calcific disease is noted but there are no hemodynamically significant stenoses within the aorta.  Single renal arteries are noted.  Their origins and proximal segments are obscured by bowel gas but there does not appear to be a hemodynamically significant lesion.  The aortic bifurcation is patent.  The right common iliac is diffusely diseased but there are no hemodynamically significant lesions.  The left mid common iliac artery demonstrates what appears to be a 60% stenosis, there is a greater than 70% stenosis in the left external iliac artery and  its distal one third.  The left internal iliac artery appears to be patent although diffusely diseased and clearly is the dominant artery.  On the right there is a string sign in the internal iliac artery at its origin and very poor filling distally.  There is a borderline lesion perhaps 50% in the right mid external iliac artery.  The right common  femoral is diffusely diseased but there are no hemodynamically significant lesions.  The right profunda femoris demonstrates 2 greater than 80% stenoses in its main trunk there are tandem lesions the proximal lesion is 20 to 25 mm in length and the distal lesion is approximately 10 mm in length.  The proximal superficial femoral artery is diffusely diseased but widely patent.  In the distal one third essentially at [facility] canal there is a greater than 60% stenosis that extends over 20 mm and then slightly more distally there is a focal string sign a greater than 90% which extends over 15 to 20 mm.  The popliteal is diffusely diseased but widely patent.  Trifurcation is patent there appears to be three-vessel runoff to the foot however at the origin of the tibioperoneal trunk there does appear to be a 50 to possibly 60% stenosis that extends over 10 mm.  The anterior tibial and posterior tibial arteries appear to contribute to the foot equally with an intact pedal arch.  5000 units of heparin was then given and allowed to circulate and a 6 [place] Rabie sheath was advanced up and over the bifurcation and positioned in the femoral artery  KMP  catheter and advantage Glidewire were then negotiated down into the distal popliteal.  Repeat imaging of the SFA stenoses was then performed through the Rabie sheath.  A 7 mm x 80 mm life stent was then deployed across these tandem lesions.  It was then postdilated with a 6 mm x 80 mm Lutonix drug-eluting balloon inflated to 14 atm for 1 minute.  Follow-up imaging demonstrated less than 5% residual stenosis with preservation of the distal runoff.  After review of these images the sheath is pulled into the right external iliac oblique of the common femoral is obtained and a Star close device deployed. There no immediate complications.   Findings:   The abdominal aorta is opacified with a bolus injection contrast, diffuse calcific disease is noted but there are no  hemodynamically significant stenoses within the aorta.  Single renal arteries are noted.  Their origins and proximal segments are obscured by bowel gas but there does not appear to be a hemodynamically significant lesion.  The aortic bifurcation is patent.  The right common iliac is diffusely diseased but there are no hemodynamically significant lesions.  The left mid common iliac artery demonstrates what appears to be a 60% stenosis, there is a greater than 70% stenosis in the left external iliac artery and its distal one third.  The left internal iliac artery appears to be patent although diffusely diseased and clearly is the dominant artery.  On the right there is a string sign in the internal iliac artery at its origin and very poor filling distally.  There is a borderline lesion perhaps 50% in the right mid external iliac artery.  The right common femoral is diffusely diseased but there are no hemodynamically significant lesions.  The right profunda femoris demonstrates 2 greater than 80% stenoses in its main trunk there are tandem lesions the proximal lesion is 20 to 25 mm in length and the distal lesion is  approximately 10 mm in length.  The proximal superficial femoral artery is diffusely diseased but widely patent.  In the distal one third essentially at [facility] canal there is a greater than 60% stenosis that extends over 20 mm and then slightly more distally there is a focal string sign a greater than 90% which extends over 15 to 20 mm.  The popliteal is diffusely diseased but widely patent.  Trifurcation is patent there appears to be three-vessel runoff to the foot however at the origin of the tibioperoneal trunk there does appear to be a 50 to possibly 60% stenosis that extends over 10 mm.  The anterior tibial and posterior tibial arteries appear to contribute to the foot equally with an intact pedal arch.  Following angioplasty and stent placement the SFA popliteal are now widely patent with less  than 5% residual stenosis.  The distal runoff is preserved.   Summary: Successful recanalization right lower extremity for limb salvage                        Disposition: Patient was taken to the recovery room in stable condition having tolerated the procedure well.  Dannelle Rhymes, Dolores Lory 07/30/2021,12:58 PM

## 2021-07-30 NOTE — Interval H&P Note (Signed)
History and Physical Interval Note:  07/30/2021 11:18 AM  Oscar Moody.  has presented today for surgery, with the diagnosis of RLE Angio w possible intervention   BARD   ASO w claudication.  The various methods of treatment have been discussed with the patient and family. After consideration of risks, benefits and other options for treatment, the patient has consented to  Procedure(s): Lower Extremity Angiography (Right) as a surgical intervention.  The patient's history has been reviewed, patient examined, no change in status, stable for surgery.  I have reviewed the patient's chart and labs.  Questions were answered to the patient's satisfaction.     Hortencia Pilar

## 2021-07-31 ENCOUNTER — Encounter: Payer: Self-pay | Admitting: Vascular Surgery

## 2021-08-23 ENCOUNTER — Other Ambulatory Visit (INDEPENDENT_AMBULATORY_CARE_PROVIDER_SITE_OTHER): Payer: Self-pay | Admitting: Vascular Surgery

## 2021-08-23 DIAGNOSIS — I70211 Atherosclerosis of native arteries of extremities with intermittent claudication, right leg: Secondary | ICD-10-CM

## 2021-08-23 DIAGNOSIS — Z95820 Peripheral vascular angioplasty status with implants and grafts: Secondary | ICD-10-CM

## 2021-08-29 ENCOUNTER — Telehealth: Payer: Self-pay | Admitting: *Deleted

## 2021-08-29 ENCOUNTER — Encounter (INDEPENDENT_AMBULATORY_CARE_PROVIDER_SITE_OTHER): Payer: Medicare Other

## 2021-08-29 ENCOUNTER — Ambulatory Visit (INDEPENDENT_AMBULATORY_CARE_PROVIDER_SITE_OTHER): Payer: Medicare Other | Admitting: Nurse Practitioner

## 2021-08-29 NOTE — Telephone Encounter (Signed)
REQUEST FOR CARDIAC CLEARANCE       Request Clearance from Dr. Delana Meyer  Faxed to: [phone]  Procedure: Shyrl Numbers injection  Date of Procedure:   Provider: Dr.Brandon    Cardiac  Clearance :   Reason: OK to hold Aspirin and Plavix for 5 days prior?     Risk Assessment:    Low   []       Moderate   []     High   []           This patient is optimized for surgery  YES []       NO   []    I recommend further assessment/workup prior to procedure:    YES []      NO  []   Appointment scheduled for: _______________________   Further recommendations: ______________________________    Physician Signature:__________________________________   Printed Name: ________________________________________   Date: _________________

## 2021-09-10 NOTE — Telephone Encounter (Addendum)
Left VM to return call to review Dr. Nino Parsley recommendations to wait 4 months for [facility] treatment

## 2021-12-27 DIAGNOSIS — S52023A Displaced fracture of olecranon process without intraarticular extension of unspecified ulna, initial encounter for closed fracture: Secondary | ICD-10-CM | POA: Insufficient documentation

## 2021-12-29 DIAGNOSIS — S52502A Unspecified fracture of the lower end of left radius, initial encounter for closed fracture: Secondary | ICD-10-CM | POA: Insufficient documentation

## 2021-12-29 DIAGNOSIS — S82141A Displaced bicondylar fracture of right tibia, initial encounter for closed fracture: Secondary | ICD-10-CM | POA: Insufficient documentation

## 2021-12-30 DIAGNOSIS — S82143A Displaced bicondylar fracture of unspecified tibia, initial encounter for closed fracture: Secondary | ICD-10-CM | POA: Insufficient documentation

## 2021-12-31 DIAGNOSIS — F1190 Opioid use, unspecified, uncomplicated: Secondary | ICD-10-CM | POA: Insufficient documentation

## 2022-01-03 DIAGNOSIS — G8918 Other acute postprocedural pain: Secondary | ICD-10-CM | POA: Insufficient documentation

## 2022-01-26 ENCOUNTER — Encounter: Payer: Self-pay | Admitting: Emergency Medicine

## 2022-01-26 ENCOUNTER — Ambulatory Visit
Admission: EM | Admit: 2022-01-26 | Discharge: 2022-01-26 | Disposition: A | Discharge status: Home / Self Care | Payer: Medicare Other | Attending: Family Medicine | Admitting: Family Medicine

## 2022-01-26 DIAGNOSIS — S61512D Laceration without foreign body of left wrist, subsequent encounter: Secondary | ICD-10-CM | POA: Diagnosis not present

## 2022-01-26 DIAGNOSIS — S51011D Laceration without foreign body of right elbow, subsequent encounter: Secondary | ICD-10-CM

## 2022-01-26 DIAGNOSIS — S81012D Laceration without foreign body, left knee, subsequent encounter: Secondary | ICD-10-CM

## 2022-01-26 DIAGNOSIS — L03113 Cellulitis of right upper limb: Secondary | ICD-10-CM

## 2022-01-26 DIAGNOSIS — Z4802 Encounter for removal of sutures: Secondary | ICD-10-CM | POA: Diagnosis not present

## 2022-01-26 MED ORDER — DOXYCYCLINE HYCLATE 100 MG PO CAPS: 100.0000 mg | ORAL_CAPSULE | Freq: Two times a day (BID) | ORAL | 0 refills | Status: AC

## 2022-01-26 MED ORDER — CEFTRIAXONE SODIUM 1 G IJ SOLR
1.0000 g | Freq: Once | INTRAMUSCULAR | Status: AC
  Administered 2022-01-26: 1 g via INTRAMUSCULAR
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
## 2022-01-26 NOTE — Discharge Instructions (Addendum)
You were given an antibiotic for your skin infection today. A prescription antibiotic was also sent to the pharmacy. You have a condition requiring you to follow up with Orthopedics so please call one of the following office for appointment or return to our orthopedic surgeon:   [facility] [address] [place], [place] [address] Phone: [phone]  [facility] [address], [place], [place] [address] Phone: [phone]
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
## 2022-02-21 ENCOUNTER — Other Ambulatory Visit (INDEPENDENT_AMBULATORY_CARE_PROVIDER_SITE_OTHER): Payer: Self-pay | Admitting: Vascular Surgery

## 2022-02-21 DIAGNOSIS — Z9889 Other specified postprocedural states: Secondary | ICD-10-CM

## 2022-02-25 ENCOUNTER — Inpatient Hospital Stay
Admission: EM | Admit: 2022-02-25 | Discharge: 2022-02-25 | DRG: 640 | Discharge status: Against Medical Advice | Payer: Medicare Other | Attending: Internal Medicine | Admitting: Internal Medicine

## 2022-02-25 ENCOUNTER — Emergency Department: Payer: Medicare Other

## 2022-02-25 ENCOUNTER — Inpatient Hospital Stay (HOSPITAL_BASED_OUTPATIENT_CLINIC_OR_DEPARTMENT_OTHER)
Admit: 2022-02-25 | Discharge: 2022-02-25 | Disposition: A | Discharge status: Home / Self Care | Payer: Medicare Other | Attending: Family Medicine | Admitting: Family Medicine

## 2022-02-25 DIAGNOSIS — Z7982 Long term (current) use of aspirin: Secondary | ICD-10-CM

## 2022-02-25 DIAGNOSIS — I739 Peripheral vascular disease, unspecified: Secondary | ICD-10-CM | POA: Diagnosis present

## 2022-02-25 DIAGNOSIS — Z91041 Radiographic dye allergy status: Secondary | ICD-10-CM

## 2022-02-25 DIAGNOSIS — I10 Essential (primary) hypertension: Secondary | ICD-10-CM | POA: Diagnosis not present

## 2022-02-25 DIAGNOSIS — I160 Hypertensive urgency: Secondary | ICD-10-CM | POA: Diagnosis present

## 2022-02-25 DIAGNOSIS — Z86718 Personal history of other venous thrombosis and embolism: Secondary | ICD-10-CM | POA: Diagnosis not present

## 2022-02-25 DIAGNOSIS — R0902 Hypoxemia: Secondary | ICD-10-CM

## 2022-02-25 DIAGNOSIS — I132 Hypertensive heart and chronic kidney disease with heart failure and with stage 5 chronic kidney disease, or end stage renal disease: Secondary | ICD-10-CM | POA: Insufficient documentation

## 2022-02-25 DIAGNOSIS — Z8271 Family history of polycystic kidney: Secondary | ICD-10-CM | POA: Diagnosis not present

## 2022-02-25 DIAGNOSIS — I5A Non-ischemic myocardial injury (non-traumatic): Secondary | ICD-10-CM | POA: Diagnosis present

## 2022-02-25 DIAGNOSIS — N186 End stage renal disease: Secondary | ICD-10-CM

## 2022-02-25 DIAGNOSIS — I509 Heart failure, unspecified: Secondary | ICD-10-CM | POA: Diagnosis not present

## 2022-02-25 DIAGNOSIS — J9601 Acute respiratory failure with hypoxia: Secondary | ICD-10-CM | POA: Diagnosis present

## 2022-02-25 DIAGNOSIS — K219 Gastro-esophageal reflux disease without esophagitis: Secondary | ICD-10-CM | POA: Diagnosis present

## 2022-02-25 DIAGNOSIS — E7800 Pure hypercholesterolemia, unspecified: Secondary | ICD-10-CM | POA: Diagnosis present

## 2022-02-25 DIAGNOSIS — F17200 Nicotine dependence, unspecified, uncomplicated: Secondary | ICD-10-CM | POA: Diagnosis not present

## 2022-02-25 DIAGNOSIS — E8770 Fluid overload, unspecified: Secondary | ICD-10-CM | POA: Diagnosis present

## 2022-02-25 DIAGNOSIS — R0603 Acute respiratory distress: Secondary | ICD-10-CM | POA: Insufficient documentation

## 2022-02-25 DIAGNOSIS — Z20822 Contact with and (suspected) exposure to covid-19: Secondary | ICD-10-CM | POA: Diagnosis present

## 2022-02-25 DIAGNOSIS — Z5329 Procedure and treatment not carried out because of patient's decision for other reasons: Secondary | ICD-10-CM | POA: Diagnosis present

## 2022-02-25 DIAGNOSIS — I959 Hypotension, unspecified: Secondary | ICD-10-CM | POA: Diagnosis not present

## 2022-02-25 DIAGNOSIS — E785 Hyperlipidemia, unspecified: Secondary | ICD-10-CM

## 2022-02-25 DIAGNOSIS — Z8249 Family history of ischemic heart disease and other diseases of the circulatory system: Secondary | ICD-10-CM

## 2022-02-25 DIAGNOSIS — F17210 Nicotine dependence, cigarettes, uncomplicated: Secondary | ICD-10-CM | POA: Diagnosis present

## 2022-02-25 DIAGNOSIS — D631 Anemia in chronic kidney disease: Secondary | ICD-10-CM | POA: Diagnosis present

## 2022-02-25 DIAGNOSIS — N2581 Secondary hyperparathyroidism of renal origin: Secondary | ICD-10-CM | POA: Diagnosis present

## 2022-02-25 DIAGNOSIS — J45909 Unspecified asthma, uncomplicated: Secondary | ICD-10-CM | POA: Insufficient documentation

## 2022-02-25 DIAGNOSIS — I3139 Other pericardial effusion (noninflammatory): Secondary | ICD-10-CM | POA: Insufficient documentation

## 2022-02-25 DIAGNOSIS — Z91158 Patient's noncompliance with renal dialysis for other reason: Secondary | ICD-10-CM | POA: Diagnosis not present

## 2022-02-25 DIAGNOSIS — Z992 Dependence on renal dialysis: Secondary | ICD-10-CM

## 2022-02-25 DIAGNOSIS — Z79899 Other long term (current) drug therapy: Secondary | ICD-10-CM | POA: Insufficient documentation

## 2022-02-25 DIAGNOSIS — I120 Hypertensive chronic kidney disease with stage 5 chronic kidney disease or end stage renal disease: Secondary | ICD-10-CM | POA: Diagnosis present

## 2022-02-25 DIAGNOSIS — Z885 Allergy status to narcotic agent status: Secondary | ICD-10-CM | POA: Diagnosis not present

## 2022-02-25 DIAGNOSIS — Z7902 Long term (current) use of antithrombotics/antiplatelets: Secondary | ICD-10-CM

## 2022-02-25 DIAGNOSIS — R0602 Shortness of breath: Principal | ICD-10-CM | POA: Insufficient documentation

## 2022-02-25 DIAGNOSIS — F419 Anxiety disorder, unspecified: Secondary | ICD-10-CM | POA: Insufficient documentation

## 2022-02-25 DIAGNOSIS — Z1152 Encounter for screening for COVID-19: Secondary | ICD-10-CM | POA: Insufficient documentation

## 2022-02-25 DIAGNOSIS — R0789 Other chest pain: Secondary | ICD-10-CM | POA: Insufficient documentation

## 2022-02-25 DIAGNOSIS — I351 Nonrheumatic aortic (valve) insufficiency: Secondary | ICD-10-CM | POA: Insufficient documentation

## 2022-02-25 LAB — CBC WITH DIFFERENTIAL/PLATELET
Abs Immature Granulocytes: 0.01 10*3/uL (ref 0.00–0.07)
Basophils Absolute: 0 10*3/uL (ref 0.0–0.1)
Basophils Relative: 1 %
Eosinophils Absolute: 0.2 10*3/uL (ref 0.0–0.5)
Eosinophils Relative: 3 %
HCT: 34.8 % — ABNORMAL LOW (ref 39.0–52.0)
Hemoglobin: 10.3 g/dL — ABNORMAL LOW (ref 13.0–17.0)
Immature Granulocytes: 0 %
Lymphocytes Relative: 14 %
Lymphs Abs: 0.8 10*3/uL (ref 0.7–4.0)
MCH: 28.9 pg (ref 26.0–34.0)
MCHC: 29.6 g/dL — ABNORMAL LOW (ref 30.0–36.0)
MCV: 97.8 fL (ref 80.0–100.0)
Monocytes Absolute: 0.3 10*3/uL (ref 0.1–1.0)
Monocytes Relative: 6 %
Neutro Abs: 4.3 10*3/uL (ref 1.7–7.7)
Neutrophils Relative %: 76 %
Platelets: 156 10*3/uL (ref 150–400)
RBC: 3.56 MIL/uL — ABNORMAL LOW (ref 4.22–5.81)
RDW: 15.3 % (ref 11.5–15.5)
WBC: 5.6 10*3/uL (ref 4.0–10.5)
nRBC: 0 % (ref 0.0–0.2)

## 2022-02-25 LAB — ECHOCARDIOGRAM COMPLETE
AR max vel: 3.19 cm2
AV Area VTI: 3.34 cm2
AV Area mean vel: 2.92 cm2
AV Mean grad: 2 mmHg
AV Peak grad: 4.6 mmHg
Ao pk vel: 1.07 m/s
Area-P 1/2: 5.14 cm2
Calc EF: 41.3 %
Height: 72 in
MV M vel: 2.68 m/s
MV Peak grad: 28.6 mmHg
P 1/2 time: 383 msec
S' Lateral: 4.5 cm
Single Plane A2C EF: 38.1 %
Single Plane A4C EF: 41.7 %
Weight: 3360 oz

## 2022-02-25 LAB — BLOOD GAS, ARTERIAL
Acid-Base Excess: 2.2 mmol/L — ABNORMAL HIGH (ref 0.0–2.0)
Bicarbonate: 28.3 mmol/L — ABNORMAL HIGH (ref 20.0–28.0)
Delivery systems: POSITIVE
Expiratory PAP: 8 cmH2O
FIO2: 50 %
Inspiratory PAP: 14 cmH2O
O2 Saturation: 58.5 %
Patient temperature: 37
pCO2 arterial: 49 mmHg — ABNORMAL HIGH (ref 32–48)
pH, Arterial: 7.37 (ref 7.35–7.45)
pO2, Arterial: 40 mmHg — CL (ref 83–108)

## 2022-02-25 LAB — COMPREHENSIVE METABOLIC PANEL
ALT: 8 U/L (ref 0–44)
AST: 16 U/L (ref 15–41)
Albumin: 3.9 g/dL (ref 3.5–5.0)
Alkaline Phosphatase: 56 U/L (ref 38–126)
Anion gap: 16 — ABNORMAL HIGH (ref 5–15)
BUN: 48 mg/dL — ABNORMAL HIGH (ref 6–20)
CO2: 24 mmol/L (ref 22–32)
Calcium: 9.8 mg/dL (ref 8.9–10.3)
Chloride: 97 mmol/L — ABNORMAL LOW (ref 98–111)
Creatinine, Ser: 10.3 mg/dL — ABNORMAL HIGH (ref 0.61–1.24)
GFR, Estimated: 5 mL/min — ABNORMAL LOW (ref >60)
Glucose, Bld: 104 mg/dL — ABNORMAL HIGH (ref 70–99)
Potassium: 4.8 mmol/L (ref 3.5–5.1)
Sodium: 137 mmol/L (ref 135–145)
Total Bilirubin: 0.8 mg/dL (ref 0.3–1.2)
Total Protein: 7 g/dL (ref 6.5–8.1)

## 2022-02-25 LAB — HIV ANTIBODY (ROUTINE TESTING W REFLEX): HIV Screen 4th Generation wRfx: NONREACTIVE

## 2022-02-25 LAB — TROPONIN I (HIGH SENSITIVITY)
Troponin I (High Sensitivity): 25 ng/L — ABNORMAL HIGH (ref <18)
Troponin I (High Sensitivity): 33 ng/L — ABNORMAL HIGH (ref <18)

## 2022-02-25 LAB — RESP PANEL BY RT-PCR (RSV, FLU A&B, COVID)  RVPGX2
Influenza A by PCR: NEGATIVE
Influenza B by PCR: NEGATIVE
Resp Syncytial Virus by PCR: NEGATIVE
SARS Coronavirus 2 by RT PCR: NEGATIVE

## 2022-02-25 LAB — BRAIN NATRIURETIC PEPTIDE: B Natriuretic Peptide: 1219.5 pg/mL — ABNORMAL HIGH (ref 0.0–100.0)

## 2022-02-25 MED ORDER — ALBUTEROL SULFATE (2.5 MG/3ML) 0.083% IN NEBU: 2.5000 mg | INHALATION_SOLUTION | RESPIRATORY_TRACT | Status: DC | PRN

## 2022-02-25 MED ORDER — ONDANSETRON HCL 4 MG PO TABS: 4.0000 mg | ORAL_TABLET | Freq: Four times a day (QID) | ORAL | Status: DC | PRN

## 2022-02-25 MED ORDER — FUROSEMIDE 10 MG/ML IJ SOLN
80.0000 mg | Freq: Once | INTRAMUSCULAR | Status: AC
  Administered 2022-02-25: 80 mg via INTRAVENOUS
  Filled 2022-02-25: qty 8

## 2022-02-25 MED ORDER — METHOCARBAMOL 750 MG PO TABS
750.0000 mg | ORAL_TABLET | Freq: Three times a day (TID) | ORAL | Status: DC
  Filled 2022-02-25 (×3): qty 1

## 2022-02-25 MED ORDER — TRAZODONE HCL 50 MG PO TABS: 25.0000 mg | ORAL_TABLET | Freq: Every evening | ORAL | Status: DC | PRN

## 2022-02-25 MED ORDER — PERFLUTREN LIPID MICROSPHERE
1.0000 mL | INTRAVENOUS | Status: AC | PRN
  Administered 2022-02-25: 2 mL via INTRAVENOUS

## 2022-02-25 MED ORDER — GABAPENTIN 100 MG PO CAPS: 100.0000 mg | ORAL_CAPSULE | Freq: Two times a day (BID) | ORAL | Status: DC

## 2022-02-25 MED ORDER — AMLODIPINE BESYLATE 5 MG PO TABS: 10.0000 mg | ORAL_TABLET | Freq: Every day | ORAL | Status: DC

## 2022-02-25 MED ORDER — HEPARIN SODIUM (PORCINE) 5000 UNIT/ML IJ SOLN
5000.0000 [IU] | Freq: Three times a day (TID) | INTRAMUSCULAR | Status: DC
  Administered 2022-02-25: 5000 [IU] via SUBCUTANEOUS
  Filled 2022-02-25: qty 1

## 2022-02-25 MED ORDER — HEPARIN SODIUM (PORCINE) 1000 UNIT/ML DIALYSIS: 1000.0000 [IU] | INTRAMUSCULAR | Status: DC | PRN

## 2022-02-25 MED ORDER — CALCIUM ACETATE (PHOS BINDER) 667 MG PO CAPS
667.0000 mg | ORAL_CAPSULE | Freq: Three times a day (TID) | ORAL | Status: DC
  Filled 2022-02-25 (×3): qty 1

## 2022-02-25 MED ORDER — PANTOPRAZOLE SODIUM 40 MG PO TBEC: 40.0000 mg | DELAYED_RELEASE_TABLET | Freq: Every day | ORAL | Status: DC

## 2022-02-25 MED ORDER — SIMVASTATIN 10 MG PO TABS: 10.0000 mg | ORAL_TABLET | Freq: Every day | ORAL | Status: DC

## 2022-02-25 MED ORDER — ACETAMINOPHEN 325 MG RE SUPP: 650.0000 mg | Freq: Four times a day (QID) | RECTAL | Status: DC | PRN

## 2022-02-25 MED ORDER — DM-GUAIFENESIN ER 30-600 MG PO TB12: 1.0000 | ORAL_TABLET | Freq: Two times a day (BID) | ORAL | Status: DC | PRN

## 2022-02-25 MED ORDER — ALTEPLASE 2 MG IJ SOLR: 2.0000 mg | Freq: Once | INTRAMUSCULAR | Status: DC | PRN

## 2022-02-25 MED ORDER — FUROSEMIDE 10 MG/ML IJ SOLN
80.0000 mg | Freq: Two times a day (BID) | INTRAMUSCULAR | Status: DC
  Administered 2022-02-25: 80 mg via INTRAVENOUS
  Filled 2022-02-25: qty 8

## 2022-02-25 MED ORDER — CLOPIDOGREL BISULFATE 75 MG PO TABS: 75.0000 mg | ORAL_TABLET | Freq: Every day | ORAL | Status: DC

## 2022-02-25 MED ORDER — CHLORHEXIDINE GLUCONATE CLOTH 2 % EX PADS
6.0000 | MEDICATED_PAD | Freq: Every day | CUTANEOUS | Status: DC
  Filled 2022-02-25: qty 6

## 2022-02-25 MED ORDER — LORAZEPAM 2 MG/ML IJ SOLN
0.5000 mg | INTRAMUSCULAR | Status: DC | PRN
  Administered 2022-02-25: 0.5 mg via INTRAVENOUS
  Filled 2022-02-25: qty 1

## 2022-02-25 MED ORDER — LIDOCAINE-PRILOCAINE 2.5-2.5 % EX CREA: 1.0000 | TOPICAL_CREAM | CUTANEOUS | Status: DC | PRN

## 2022-02-25 MED ORDER — SODIUM BICARBONATE 650 MG PO TABS
650.0000 mg | ORAL_TABLET | Freq: Two times a day (BID) | ORAL | Status: DC
  Filled 2022-02-25 (×2): qty 1

## 2022-02-25 MED ORDER — MAGNESIUM HYDROXIDE 400 MG/5ML PO SUSP: 30.0000 mL | Freq: Every day | ORAL | Status: DC | PRN

## 2022-02-25 MED ORDER — ACETAMINOPHEN 325 MG PO TABS: 650.0000 mg | ORAL_TABLET | Freq: Four times a day (QID) | ORAL | Status: DC | PRN

## 2022-02-25 MED ORDER — ANTICOAGULANT SODIUM CITRATE 4% (200MG/5ML) IV SOLN: 5.0000 mL | Status: DC | PRN

## 2022-02-25 MED ORDER — PENTAFLUOROPROP-TETRAFLUOROETH EX AERO: 1.0000 | INHALATION_SPRAY | CUTANEOUS | Status: DC | PRN

## 2022-02-25 MED ORDER — LIDOCAINE HCL (PF) 1 % IJ SOLN: 5.0000 mL | INTRAMUSCULAR | Status: DC | PRN

## 2022-02-25 MED ORDER — NALOXONE HCL 4 MG/0.1ML NA LIQD: 1.0000 | NASAL | Status: DC

## 2022-02-25 MED ORDER — HYDROCODONE-ACETAMINOPHEN 7.5-325 MG PO TABS: 1.0000 | ORAL_TABLET | Freq: Four times a day (QID) | ORAL | Status: DC | PRN

## 2022-02-25 MED ORDER — ONDANSETRON HCL 4 MG/2ML IJ SOLN: 4.0000 mg | Freq: Four times a day (QID) | INTRAMUSCULAR | Status: DC | PRN

## 2022-02-25 MED ORDER — LISINOPRIL 10 MG PO TABS: 40.0000 mg | ORAL_TABLET | Freq: Every day | ORAL | Status: DC

## 2022-02-25 MED ORDER — ASPIRIN 81 MG PO TBEC: 81.0000 mg | DELAYED_RELEASE_TABLET | Freq: Every day | ORAL | Status: DC

## 2022-02-25 MED ORDER — HYDRALAZINE HCL 20 MG/ML IJ SOLN: 5.0000 mg | INTRAMUSCULAR | Status: DC | PRN

## 2022-02-25 NOTE — Progress Notes (Signed)
1350: Patient requested if he can come off from his Bipap. NP notified and agreed. Assessment done. RR is 17 and O2 sat is 99. Patient's breath sound is clear on auscultation. Started him on 5 Liters but is uncomfortable from too much air and burning sensation from his nose. Titrated oxygen down to 3 liters via nasal cannula and patient looks comfortable. RT notified.

## 2022-02-25 NOTE — ED Notes (Signed)
Caryl Pina RN to bedside for bipap alarm, pt. Had removed bipap mask and pulled out IV. This RN and Caryl Pina, RN cleaned pt. Up, replaced bipap and reoriented pt. Pt. States he had had a bad dream. Pt. Is sleepy and oriented. Linens replaced. Pt. Repositioned for comfort. No current complaints. NAD.

## 2022-02-25 NOTE — Progress Notes (Signed)
Pt transported to dialysis on bipap with no complications.

## 2022-02-25 NOTE — Progress Notes (Signed)
[facility]  ROUNDING NOTE   Subjective:   Oscar Moody. Is a 52 y.o. male with past medical history of arthritis, nicotine abuse, DVT, GERD, headache, hypertension, and end-stage renal disease on hemodialysis.  Patient presents to the emergency department due to respiratory distress.  He has been admitted for Respiratory distress [R06.03] Hypoxia [R09.02] ESRD (end stage renal disease) on dialysis ([place]) [N18.6, Z99.2] Fluid overload [E87.70] Acute on chronic congestive heart failure, unspecified heart failure type [facility]) [I50.9]  Patient is known to our practice and receives outpatient dialysis treatments at [facility] on a MWF schedule, supervised by Dr. Candiss Norse.  It was reported that patient missed treatment on Sunday due to not feeling well.  Outpatient clinic closed Monday due to holiday.  Patient seen and evaluated in ED, currently on BiPAP.Patient somnolent, only arousable for short periods. Chart review used to obtain history.  Patient presented with shortness of breath and due to continued urination, was given furosemide in the emergency department.  Denies fever, nausea, vomiting or diarrhea on ED arrival.  Labs on ED arrival include glucose 104, BUN 48, creatinine 10.3 with GFR 5, BNP greater than 1200, troponin 25, and hemoglobin 10.3.  Respiratory panel negative for influenza, COVID-19, and RSVChest x-ray negative for acute findings.  Echo pending.  We have been consulted to assist with dialysis needs during this admission.   Objective:  Vital signs in last 24 hours:  Temp:  [97.6 F (36.4 C)-98.4 F (36.9 C)] 98.4 F (36.9 C) (01/02 1105) Pulse Rate:  [69-88] 78 (01/02 1510) Resp:  [13-23] 13 (01/02 1510) BP: (142-189)/(76-97) 189/86 (01/02 1510) SpO2:  [98 %-100 %] 99 % (01/02 1510) FiO2 (%):  [35 %-50 %] 35 % (01/02 1105) Weight:  [95.3 kg] 95.3 kg (01/02 0217)  Weight change:  Filed Weights   02/25/22 0217  Weight: 95.3 kg     Intake/Output: No intake/output data recorded.   Intake/Output this shift:  No intake/output data recorded.  Physical Exam: General: NAD  Head: Normocephalic, atraumatic.  Eyes: Anicteric  Lungs:  Rhonchi, BiPAP in place  Heart: Regular rate and rhythm  Abdomen:  Soft, nontender, obese  Extremities: Trace peripheral edema.  Neurologic: Nonfocal, moving all four extremities  Skin: No lesions  Access: Rt AVF    Basic Metabolic Panel: Recent Labs  Lab 02/25/22 0207  NA 137  K 4.8  CL 97*  CO2 24  GLUCOSE 104*  BUN 48*  CREATININE 10.30*  CALCIUM 9.8    Liver Function Tests: Recent Labs  Lab 02/25/22 0207  AST 16  ALT 8  ALKPHOS 56  BILITOT 0.8  PROT 7.0  ALBUMIN 3.9   No results for input(s): "LIPASE", "AMYLASE" in the last 168 hours. No results for input(s): "AMMONIA" in the last 168 hours.  CBC: Recent Labs  Lab 02/25/22 0207  WBC 5.6  NEUTROABS 4.3  HGB 10.3*  HCT 34.8*  MCV 97.8  PLT 156    Cardiac Enzymes: No results for input(s): "CKTOTAL", "CKMB", "CKMBINDEX", "TROPONINI" in the last 168 hours.  BNP: Invalid input(s): "POCBNP"  CBG: No results for input(s): "GLUCAP" in the last 168 hours.  Microbiology: Results for orders placed or performed during the hospital encounter of 02/25/22  Resp panel by RT-PCR (RSV, Flu A&B, Covid) Anterior Nasal Swab     Status: None   Collection Time: 02/25/22  2:07 AM   Specimen: Anterior Nasal Swab  Result Value Ref Range Status   SARS Coronavirus 2 by RT  PCR NEGATIVE NEGATIVE Final    Comment: (NOTE) SARS-CoV-2 target nucleic acids are NOT DETECTED.  The SARS-CoV-2 RNA is generally detectable in upper respiratory specimens during the acute phase of infection. The lowest concentration of SARS-CoV-2 viral copies this assay can detect is 138 copies/mL. A negative result does not preclude SARS-Cov-2 infection and should not be used as the sole basis for treatment or other patient management  decisions. A negative result may occur with  improper specimen collection/handling, submission of specimen other than nasopharyngeal swab, presence of viral mutation(s) within the areas targeted by this assay, and inadequate number of viral copies(<138 copies/mL). A negative result must be combined with clinical observations, patient history, and epidemiological information. The expected result is Negative.  Fact Sheet for Patients:  EntrepreneurPulse.com.au  Fact Sheet for Healthcare Providers:  IncredibleEmployment.be  This test is no t yet approved or cleared by the [place] FDA and  has been authorized for detection and/or diagnosis of SARS-CoV-2 by FDA under an Emergency Use Authorization (EUA). This EUA will remain  in effect (meaning this test can be used) for the duration of the COVID-19 declaration under Section 564(b)(1) of the Act, 21 U.S.C.section 360bbb-3(b)(1), unless the authorization is terminated  or revoked sooner.       Influenza A by PCR NEGATIVE NEGATIVE Final   Influenza B by PCR NEGATIVE NEGATIVE Final    Comment: (NOTE) The Xpert Xpress SARS-CoV-2/FLU/RSV plus assay is intended as an aid in the diagnosis of influenza from Nasopharyngeal swab specimens and should not be used as a sole basis for treatment. Nasal washings and aspirates are unacceptable for Xpert Xpress SARS-CoV-2/FLU/RSV testing.  Fact Sheet for Patients: EntrepreneurPulse.com.au  Fact Sheet for Healthcare Providers: IncredibleEmployment.be  This test is not yet approved or cleared by the [place] FDA and has been authorized for detection and/or diagnosis of SARS-CoV-2 by FDA under an Emergency Use Authorization (EUA). This EUA will remain in effect (meaning this test can be used) for the duration of the COVID-19 declaration under Section 564(b)(1) of the Act, 21 U.S.C. section 360bbb-3(b)(1), unless the  authorization is terminated or revoked.     Resp Syncytial Virus by PCR NEGATIVE NEGATIVE Final    Comment: (NOTE) Fact Sheet for Patients: EntrepreneurPulse.com.au  Fact Sheet for Healthcare Providers: IncredibleEmployment.be  This test is not yet approved or cleared by the [place] FDA and has been authorized for detection and/or diagnosis of SARS-CoV-2 by FDA under an Emergency Use Authorization (EUA). This EUA will remain in effect (meaning this test can be used) for the duration of the COVID-19 declaration under Section 564(b)(1) of the Act, 21 U.S.C. section 360bbb-3(b)(1), unless the authorization is terminated or revoked.  Performed at [facility], [address] [place], [place] [address]     Coagulation Studies: No results for input(s): "LABPROT", "INR" in the last 72 hours.  Urinalysis: No results for input(s): "COLORURINE", "LABSPEC", "PHURINE", "GLUCOSEU", "HGBUR", "BILIRUBINUR", "KETONESUR", "PROTEINUR", "UROBILINOGEN", "NITRITE", "LEUKOCYTESUR" in the last 72 hours.  Invalid input(s): "APPERANCEUR"    Imaging: DG Chest Port 1 View  Result Date: 02/25/2022 CLINICAL DATA:  Shortness of breath EXAM: PORTABLE CHEST 1 VIEW COMPARISON:  None Available. FINDINGS: The heart size and mediastinal contours are within normal limits. Both lungs are clear. The visualized skeletal structures are unremarkable. IMPRESSION: No active disease. Electronically Signed   By: Ulyses Jarred M.D.   On: 02/25/2022 02:14     Medications:    anticoagulant sodium citrate  amLODipine  10 mg Oral Daily   aspirin EC  81 mg Oral Daily   calcium acetate  667 mg Oral TID WC   Chlorhexidine Gluconate Cloth  6 each Topical Q0600   clopidogrel  75 mg Oral Daily   furosemide  80 mg Intravenous BID   gabapentin  100 mg Oral BID   heparin  5,000 Units Subcutaneous Q8H   lisinopril  40 mg Oral Daily   methocarbamol  750 mg Oral TID    naloxone  1 spray Nasal UD   pantoprazole  40 mg Oral Daily   simvastatin  10 mg Oral Daily   sodium bicarbonate  650 mg Oral BID   acetaminophen **OR** acetaminophen, albuterol, alteplase, anticoagulant sodium citrate, dextromethorphan-guaiFENesin, heparin, hydrALAZINE, HYDROcodone-acetaminophen, lidocaine (PF), lidocaine-prilocaine, LORazepam, magnesium hydroxide, ondansetron **OR** ondansetron (ZOFRAN) IV, pentafluoroprop-tetrafluoroeth, traZODone  Assessment/ Plan:  Oscar Moody. is a 52 y.o.  male with past medical history of arthritis, nicotine abuse, DVT, GERD, headache, hypertension, and end-stage renal disease on hemodialysis.  Patient presents to the emergency department due to respiratory distress.  He has been admitted for Respiratory distress [R06.03] Hypoxia [R09.02] ESRD (end stage renal disease) on dialysis ([place]) [N18.6, Z99.2] Fluid overload [E87.70] Acute on chronic congestive heart failure, unspecified heart failure type ([place]) [I50.9]  [facility]/MWF/right aVF/96.0kg   End-stage renal disease with acute respiratory failure on hemodialysis.  Last treatment received on Friday.  Placed on BiPAP on ED arrival.  Will receive urgent dialysis treatment today, UF goal 1.5 to 2 L as tolerated.  Will attempt BiPAP weaning during dialysis.  Next treatment scheduled for Wednesday to maintain outpatient schedule.  2. Anemia of chronic kidney disease Lab Results  Component Value Date   HGB 10.3 (L) 02/25/2022    Hemoglobin above desired range.  Will continue to monitor for now. Patient received Mircera outpatient.  3. Secondary Hyperparathyroidism: with outpatient labs: PTH 132, phosphorus 4.8, calcium 9.6 on 02/04/22.    Lab Results  Component Value Date   CALCIUM 9.8 02/25/2022   PHOS 5.3 (H) 11/24/2020   Prescribed calcitriol and calcium acetate outpatient.  Calcium currently acceptable.  Will continue to monitor.  4.  Hypertension with chronic kidney  disease.  Patient currently prescribed amlodipine, furosemide, and lisinopril.  Currently receiving these medications.   LOS: 0 Oscar Moody 1/2/20243:33 PM

## 2022-02-25 NOTE — ED Notes (Signed)
RN approached pt to give lasix's. Pt sts " I am ready to leave. I dont need to stay. I will go to dialysis in the morning. I tried to skin a day cause I had stuff going on at the house and well it didn't work in my favor so I will not skip a day again." RN expressed concern for pt and to have him stay. Pt denied. RN verbalized risks and benefits of pt leaving. Provider also verbalized risks and benefits. Pt verbalized understanding of the risks and benefits. RN removed pt IV and escorted him to the lobby.

## 2022-02-25 NOTE — Discharge Summary (Signed)
Physician Discharge Summary  [place]. DTO:[id number] DOB: 04-28-1970 DOA: 02/25/2022  PCP: Danae Orleans, MD  Admit date: 02/25/2022 Discharge date: 02/25/2022  Recommendations for Outpatient Follow-up:  -none pt left hospital on [place]: none Equipment/Devices: none   Discharge Condition: still hypertensive CODE STATUS: full code Diet recommendation: should be renal diet  Brief/Interim Summary (HPI):  Oscar Moody. is a 52 y.o. 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   Pt states that he missed dialysis yesterday due to not feeling well.  He developed shortness of breath, which has been progressively worsening.  He has chest tightness, denies active chest pain.  Patient has dry cough.  No nausea vomiting, diarrhea or abdominal pain.  No symptoms of UTI. Patient is normally not wearing oxygen at baseline, but was found to have severe respiratory distress. At one point EMS had patient on CPAP but took him off secondary to hypotension. Pt continues to have respiratory distress in ED, cannot speak in full sentence, using accessory muscle for breathing.  BiPAP is started in ED. patient is lethargic, but arousable, oriented x 3.   Data reviewed independently and ED Course: pt was found to have WBC 5.6, negative PCR for COVID, flu and RSV, potassium 4.8, bicarbonate 24, creatinine 10.30, BUN 48.  Temperature normal, blood pressure 175/94 --> 180/93 (118). heart rate 88, RR 16.  Chest x-ray negative.  Patient is admitted to PCU as inpatient.  Dr. Holley Raring of  renal is consulted for urgent dialysis     EKG: I have personally reviewed.  Sinus rhythm, QTc 445, mild T wave inversion in V4-V6, early R wave progression   Discharge Diagnoses and Hospital Course:   Principal Problem:   Fluid overload Active Problems:   Acute respiratory failure with  hypoxia (HCC)   ESRD on dialysis [facility])   Essential hypertension   Hypertensive urgency   Hyperlipidemia   Myocardial injury   Tobacco dependence    Acute respiratory failure with hypoxia due to fluid overload: Consulted Dr. Holley Raring of renal for urgent dialysis.  After dialysis, patient feels much better, but his blood pressure is persistently elevated to 180/93 (118), still has hypertensive urgency.  Patient insisted on going home. I explained the importance of being treated for his blood pressure before he can go home.  I told him, if his blood pressure going up further, he has risk of developing heart attack, stroke and even death.  He fully understand the risk.  He decided to sign AMA and left hospital.   -Patient was admitted to PCU as inpatient -Bronchodilator in hospital -Started to BiPAP -Consulted Dr. Holley Raring of renal for urgent dialysis   ESRD on dialysis [facility]) -Patient had urgent dialysis   Essential hypertension and hypertensive urgency: -Amlodipine, lisinopril  -IV hydralazine as needed in hospital   Hyperlipidemia -Zocor   Myocardial injury: Troponin level 25 --> 33.  No chest pain -Continued aspirin, Plavix, Zocor   Tobacco dependence: -will need nicotine patch     Discharge Instructions   Allergies  Allergen Reactions   Iodinated Contrast Media Other (See Comments)    hypotension   Oxycodone-Acetaminophen Itching    Consultations: renal   Procedures/Studies: ECHOCARDIOGRAM COMPLETE  Result Date: 02/25/2022    ECHOCARDIOGRAM REPORT   Patient Name:   Oscar Moody. Date of Exam: 02/25/2022 Medical Rec #:  [id number]  Height:       72.0 in Accession #:    [id number]         Weight:       210.0 lb Date of Birth:  05/27/1970           BSA:          2.175 m Patient Age:    14 years           BP:           144/82 mmHg Patient Gender: M                  HR:           76 bpm. Exam Location:  [facility] Procedure: 2D Echo and Intracardiac Opacification Agent  Indications:    CHF  History:        Patient has no prior history of Echocardiogram examinations.                 Risk Factors:Hypertension, Dyslipidemia and Current Smoker.  Sonographer:    Harvie Junior Referring Phys: [id number] JAN A MANSY  Sonographer Comments: Technically difficult study due to poor echo windows and echo performed with patient supine and on artificial respirator. Image acquisition challenging due to respiratory motion. IMPRESSIONS  1. There are multiple hypo-/anechoic structives in the visualized liver, incompletely characterized. Dedicated abdominal ultrasound or cross-sectional imaging recommended.  2. Left ventricular ejection fraction, by estimation, is 35 to 40%. The left ventricle has moderately decreased function. The left ventricle demonstrates global hypokinesis. The left ventricular internal cavity size was mildly dilated. Left ventricular diastolic parameters are indeterminate.  3. Right ventricular systolic function is normal. The right ventricular size is normal. Mildly increased right ventricular wall thickness. There is mildly elevated pulmonary artery systolic pressure. The estimated right ventricular systolic pressure is 90.2 mmHg.  4. Left atrial size was mildly dilated.  5. A small pericardial effusion is present.  6. The mitral valve is normal in structure. Trivial mitral valve regurgitation.  7. The aortic valve has an indeterminant number of cusps. Aortic valve regurgitation is mild to moderate. No aortic stenosis is present.  8. The inferior vena cava is dilated in size with <50% respiratory variability, suggesting right atrial pressure of 15 mmHg. FINDINGS  Left Ventricle: Left ventricular ejection fraction, by estimation, is 35 to 40%. The left ventricle has moderately decreased function. The left ventricle demonstrates global hypokinesis. Definity contrast agent was given IV to delineate the left ventricular endocardial borders. The left ventricular internal cavity size  was mildly dilated. There is no left ventricular hypertrophy. Left ventricular diastolic parameters are indeterminate. Right Ventricle: The right ventricular size is normal. Mildly increased right ventricular wall thickness. Right ventricular systolic function is normal. There is mildly elevated pulmonary artery systolic pressure. The tricuspid regurgitant velocity is 2.60 m/s, and with an assumed right atrial pressure of 15 mmHg, the estimated right ventricular systolic pressure is 40.9 mmHg. Left Atrium: Left atrial size was mildly dilated. Right Atrium: Right atrial size was normal in size. Pericardium: A small pericardial effusion is present. Mitral Valve: The mitral valve is normal in structure. Trivial mitral valve regurgitation. Tricuspid Valve: The tricuspid valve is grossly normal. Tricuspid valve regurgitation is trivial. Aortic Valve: The aortic valve has an indeterminant number of cusps. Aortic valve regurgitation is mild to moderate. Aortic regurgitation PHT measures 383 msec. No aortic stenosis is present. Aortic valve mean gradient measures 2.0 mmHg. Aortic valve peak gradient measures 4.6 mmHg. Aortic  valve area, by VTI measures 3.34 cm. Pulmonic Valve: The pulmonic valve was not well visualized. Pulmonic valve regurgitation is not visualized. No evidence of pulmonic stenosis. Aorta: The aortic root is normal in size and structure. Pulmonary Artery: The pulmonary artery is of normal size. Venous: The inferior vena cava is dilated in size with less than 50% respiratory variability, suggesting right atrial pressure of 15 mmHg. IAS/Shunts: The interatrial septum was not well visualized. Additional Comments: There are multiple hypo-/anechoic structives in the visualized liver, incompletely characterized. Dedicated abdominal ultrasound or cross-sectional imaging recommended.  LEFT VENTRICLE PLAX 2D LVIDd:         5.70 cm      Diastology LVIDs:         4.50 cm      LV e' medial:    7.83 cm/s LV PW:          0.90 cm      LV E/e' medial:  17.4 LV IVS:        0.90 cm      LV e' lateral:   9.14 cm/s LVOT diam:     2.10 cm      LV E/e' lateral: 14.9 LV SV:         64 LV SV Index:   29 LVOT Area:     3.46 cm  LV Volumes (MOD) LV vol d, MOD A2C: 201.0 ml LV vol d, MOD A4C: 183.0 ml LV vol s, MOD A2C: 124.5 ml LV vol s, MOD A4C: 106.7 ml LV SV MOD A2C:     76.5 ml LV SV MOD A4C:     183.0 ml LV SV MOD BP:      80.6 ml RIGHT VENTRICLE RV Basal diam:  3.70 cm RV Mid diam:    3.60 cm RV S prime:     12.70 cm/s TAPSE (M-mode): 2.2 cm LEFT ATRIUM             Index        RIGHT ATRIUM           Index LA diam:        3.40 cm 1.56 cm/m   RA Area:     11.40 cm LA Vol (A2C):   49.6 ml 22.80 ml/m  RA Volume:   23.60 ml  10.85 ml/m LA Vol (A4C):   79.4 ml 36.50 ml/m LA Biplane Vol: 66.0 ml 30.34 ml/m  AORTIC VALVE                    PULMONIC VALVE AV Area (Vmax):    3.19 cm     PV Vmax:       0.90 m/s AV Area (Vmean):   2.92 cm     PV Peak grad:  3.2 mmHg AV Area (VTI):     3.34 cm AV Vmax:           107.00 cm/s AV Vmean:          73.100 cm/s AV VTI:            0.192 m AV Peak Grad:      4.6 mmHg AV Mean Grad:      2.0 mmHg LVOT Vmax:         98.50 cm/s LVOT Vmean:        61.600 cm/s LVOT VTI:          0.185 m LVOT/AV VTI ratio: 0.96 AI PHT:  383 msec  AORTA Ao Root diam: 3.60 cm MITRAL VALVE                TRICUSPID VALVE MV Area (PHT): 5.14 cm     TR Peak grad:   27.0 mmHg MV Decel Time: 148 msec     TR Vmax:        260.00 cm/s MR Peak grad: 28.6 mmHg MR Vmax:      267.50 cm/s   SHUNTS MV E velocity: 136.00 cm/s  Systemic VTI:  0.18 m MV A velocity: 38.25 cm/s   Systemic Diam: 2.10 cm MV E/A ratio:  3.56 Nelva Bush MD Electronically signed by Nelva Bush MD Signature Date/Time: 02/25/2022/3:51:38 PM    Final    DG Chest Port 1 View  Result Date: 02/25/2022 CLINICAL DATA:  Shortness of breath EXAM: PORTABLE CHEST 1 VIEW COMPARISON:  None Available. FINDINGS: The heart size and mediastinal contours are  within normal limits. Both lungs are clear. The visualized skeletal structures are unremarkable. IMPRESSION: No active disease. Electronically Signed   By: Ulyses Jarred M.D.   On: 02/25/2022 02:14      Discharge Exam: Vitals:   02/25/22 1530 02/25/22 1702  BP: (!) 170/96   Pulse: 78   Resp: 20   Temp: 97.7 F (36.5 C) 98 F (36.7 C)  SpO2: 98%    Vitals:   02/25/22 1500 02/25/22 1510 02/25/22 1530 02/25/22 1702  BP: (!) 181/97 (!) 189/86 (!) 170/96   Pulse: 73 78 78   Resp: (!) 23 13 20    Temp:   97.7 F (36.5 C) 98 F (36.7 C)  TempSrc:   Oral   SpO2: 99% 99% 98%   Weight:   97.7 kg   Height:       Not done, pt left hospital on AMA   The results of significant diagnostics from this hospitalization (including imaging, microbiology, ancillary and laboratory) are listed below for reference.     Microbiology: Recent Results (from the past 240 hour(s))  Resp panel by RT-PCR (RSV, Flu A&B, Covid) Anterior Nasal Swab     Status: None   Collection Time: 02/25/22  2:07 AM   Specimen: Anterior Nasal Swab  Result Value Ref Range Status   SARS Coronavirus 2 by RT PCR NEGATIVE NEGATIVE Final    Comment: (NOTE) SARS-CoV-2 target nucleic acids are NOT DETECTED.  The SARS-CoV-2 RNA is generally detectable in upper respiratory specimens during the acute phase of infection. The lowest concentration of SARS-CoV-2 viral copies this assay can detect is 138 copies/mL. A negative result does not preclude SARS-Cov-2 infection and should not be used as the sole basis for treatment or other patient management decisions. A negative result may occur with  improper specimen collection/handling, submission of specimen other than nasopharyngeal swab, presence of viral mutation(s) within the areas targeted by this assay, and inadequate number of viral copies(<138 copies/mL). A negative result must be combined with clinical observations, patient history, and epidemiological information. The  expected result is Negative.  Fact Sheet for Patients:  EntrepreneurPulse.com.au  Fact Sheet for Healthcare Providers:  IncredibleEmployment.be  This test is no t yet approved or cleared by the [place] FDA and  has been authorized for detection and/or diagnosis of SARS-CoV-2 by FDA under an Emergency Use Authorization (EUA). This EUA will remain  in effect (meaning this test can be used) for the duration of the COVID-19 declaration under Section 564(b)(1) of the Act, 21 U.S.C.section 360bbb-3(b)(1), unless the authorization is  terminated  or revoked sooner.       Influenza A by PCR NEGATIVE NEGATIVE Final   Influenza B by PCR NEGATIVE NEGATIVE Final    Comment: (NOTE) The Xpert Xpress SARS-CoV-2/FLU/RSV plus assay is intended as an aid in the diagnosis of influenza from Nasopharyngeal swab specimens and should not be used as a sole basis for treatment. Nasal washings and aspirates are unacceptable for Xpert Xpress SARS-CoV-2/FLU/RSV testing.  Fact Sheet for Patients: EntrepreneurPulse.com.au  Fact Sheet for Healthcare Providers: IncredibleEmployment.be  This test is not yet approved or cleared by the [place] FDA and has been authorized for detection and/or diagnosis of SARS-CoV-2 by FDA under an Emergency Use Authorization (EUA). This EUA will remain in effect (meaning this test can be used) for the duration of the COVID-19 declaration under Section 564(b)(1) of the Act, 21 U.S.C. section 360bbb-3(b)(1), unless the authorization is terminated or revoked.     Resp Syncytial Virus by PCR NEGATIVE NEGATIVE Final    Comment: (NOTE) Fact Sheet for Patients: EntrepreneurPulse.com.au  Fact Sheet for Healthcare Providers: IncredibleEmployment.be  This test is not yet approved or cleared by the [place] FDA and has been authorized for detection and/or  diagnosis of SARS-CoV-2 by FDA under an Emergency Use Authorization (EUA). This EUA will remain in effect (meaning this test can be used) for the duration of the COVID-19 declaration under Section 564(b)(1) of the Act, 21 U.S.C. section 360bbb-3(b)(1), unless the authorization is terminated or revoked.  Performed at [facility], [address] [place], [place] [address]      Labs: BNP (last 3 results) Recent Labs    02/25/22 0207  BNP 2,637.8*   Basic Metabolic Panel: Recent Labs  Lab 02/25/22 0207  NA 137  K 4.8  CL 97*  CO2 24  GLUCOSE 104*  BUN 48*  CREATININE 10.30*  CALCIUM 9.8   Liver Function Tests: Recent Labs  Lab 02/25/22 0207  AST 16  ALT 8  ALKPHOS 56  BILITOT 0.8  PROT 7.0  ALBUMIN 3.9   No results for input(s): "LIPASE", "AMYLASE" in the last 168 hours. No results for input(s): "AMMONIA" in the last 168 hours. CBC: Recent Labs  Lab 02/25/22 0207  WBC 5.6  NEUTROABS 4.3  HGB 10.3*  HCT 34.8*  MCV 97.8  PLT 156   Cardiac Enzymes: No results for input(s): "CKTOTAL", "CKMB", "CKMBINDEX", "TROPONINI" in the last 168 hours. BNP: Invalid input(s): "POCBNP" CBG: No results for input(s): "GLUCAP" in the last 168 hours. D-Dimer No results for input(s): "DDIMER" in the last 72 hours. Hgb A1c No results for input(s): "HGBA1C" in the last 72 hours. Lipid Profile No results for input(s): "CHOL", "HDL", "LDLCALC", "TRIG", "CHOLHDL", "LDLDIRECT" in the last 72 hours. Thyroid function studies No results for input(s): "TSH", "T4TOTAL", "T3FREE", "THYROIDAB" in the last 72 hours.  Invalid input(s): "FREET3" Anemia work up No results for input(s): "VITAMINB12", "FOLATE", "FERRITIN", "TIBC", "IRON", "RETICCTPCT" in the last 72 hours. Urinalysis    Component Value Date/Time   APPEARANCEUR Clear 02/28/2021 1103   GLUCOSEU 1+ (A) 02/28/2021 1103   BILIRUBINUR Negative 02/28/2021 1103   PROTEINUR 3+ (A) 02/28/2021 1103   NITRITE  Negative 02/28/2021 1103   LEUKOCYTESUR Negative 02/28/2021 1103   Sepsis Labs Recent Labs  Lab 02/25/22 0207  WBC 5.6   Microbiology Recent Results (from the past 240 hour(s))  Resp panel by RT-PCR (RSV, Flu A&B, Covid) Anterior Nasal Swab     Status: None   Collection Time: 02/25/22  2:07 AM   Specimen: Anterior Nasal Swab  Result Value Ref Range Status   SARS Coronavirus 2 by RT PCR NEGATIVE NEGATIVE Final    Comment: (NOTE) SARS-CoV-2 target nucleic acids are NOT DETECTED.  The SARS-CoV-2 RNA is generally detectable in upper respiratory specimens during the acute phase of infection. The lowest concentration of SARS-CoV-2 viral copies this assay can detect is 138 copies/mL. A negative result does not preclude SARS-Cov-2 infection and should not be used as the sole basis for treatment or other patient management decisions. A negative result may occur with  improper specimen collection/handling, submission of specimen other than nasopharyngeal swab, presence of viral mutation(s) within the areas targeted by this assay, and inadequate number of viral copies(<138 copies/mL). A negative result must be combined with clinical observations, patient history, and epidemiological information. The expected result is Negative.  Fact Sheet for Patients:  EntrepreneurPulse.com.au  Fact Sheet for Healthcare Providers:  IncredibleEmployment.be  This test is no t yet approved or cleared by the [place] FDA and  has been authorized for detection and/or diagnosis of SARS-CoV-2 by FDA under an Emergency Use Authorization (EUA). This EUA will remain  in effect (meaning this test can be used) for the duration of the COVID-19 declaration under Section 564(b)(1) of the Act, 21 U.S.C.section 360bbb-3(b)(1), unless the authorization is terminated  or revoked sooner.       Influenza A by PCR NEGATIVE NEGATIVE Final   Influenza B by PCR NEGATIVE NEGATIVE  Final    Comment: (NOTE) The Xpert Xpress SARS-CoV-2/FLU/RSV plus assay is intended as an aid in the diagnosis of influenza from Nasopharyngeal swab specimens and should not be used as a sole basis for treatment. Nasal washings and aspirates are unacceptable for Xpert Xpress SARS-CoV-2/FLU/RSV testing.  Fact Sheet for Patients: EntrepreneurPulse.com.au  Fact Sheet for Healthcare Providers: IncredibleEmployment.be  This test is not yet approved or cleared by the [place] FDA and has been authorized for detection and/or diagnosis of SARS-CoV-2 by FDA under an Emergency Use Authorization (EUA). This EUA will remain in effect (meaning this test can be used) for the duration of the COVID-19 declaration under Section 564(b)(1) of the Act, 21 U.S.C. section 360bbb-3(b)(1), unless the authorization is terminated or revoked.     Resp Syncytial Virus by PCR NEGATIVE NEGATIVE Final    Comment: (NOTE) Fact Sheet for Patients: EntrepreneurPulse.com.au  Fact Sheet for Healthcare Providers: IncredibleEmployment.be  This test is not yet approved or cleared by the [place] FDA and has been authorized for detection and/or diagnosis of SARS-CoV-2 by FDA under an Emergency Use Authorization (EUA). This EUA will remain in effect (meaning this test can be used) for the duration of the COVID-19 declaration under Section 564(b)(1) of the Act, 21 U.S.C. section 360bbb-3(b)(1), unless the authorization is terminated or revoked.  Performed at [facility], [address] [place], [place] [address]     Time coordinating discharge:  20 minutes.   SIGNED:  Ivor Costa, MD [facility] 02/25/2022, 6:17 PM   If 7PM-7AM, please contact night-coverage [email or web address]

## 2022-02-25 NOTE — Progress Notes (Signed)
Patient did 3.5 hours of HD, tolerated well.  UF: 2L    02/25/22 1530  Vitals  Temp 97.7 F (36.5 C)  Temp Source Oral  BP (!) 170/96  MAP (mmHg) 119  BP Location Left Arm  BP Method Automatic  Patient Position (if appropriate) Lying  Pulse Rate 78  ECG Heart Rate 81  Resp 20  Oxygen Therapy  SpO2 98 %  O2 Device Nasal Cannula  O2 Flow Rate (L/min) 3 L/min  Patient Activity (if Appropriate) In bed  Pulse Oximetry Type Continuous  Post Treatment  Dialyzer Clearance Lightly streaked  Duration of HD Treatment -hour(s) 3.5 hour(s)  Hemodialysis Intake (mL) 0 mL  Liters Processed 73.5  Fluid Removed (mL) 2000 mL  Tolerated HD Treatment Yes  AVG/AVF Arterial Site Held (minutes) 8 minutes  AVG/AVF Venous Site Held (minutes) 8 minutes  Fistula / Graft Right Forearm Arteriovenous fistula  Placement Date/Time: 12/26/15 1645   Placed prior to admission: No  Orientation: Right  Access Location: Forearm  Access Type: (c) Arteriovenous fistula  Site Condition No complications  Fistula / Graft Assessment Present;Thrill;Bruit  Status Deaccessed  Drainage Description None

## 2022-02-25 NOTE — Progress Notes (Signed)
ABG was venous. Results given to Dr. Beather Arbour. No changes at this time.

## 2022-02-25 NOTE — ED Triage Notes (Signed)
Pt from home via [facility] c/o SOB x2 days. Pt is a dialysis Pt (M,W,F) but missed dialysis yesterday d/t not feeling well. Pt was initially on CPAP with EMS and given X1 nitro spray d/t chest pain. Pt arrived at the ED on 2L [place] and tolerating well. Pt is scheduled to makeup his missed dialysis this am.

## 2022-02-25 NOTE — Progress Notes (Signed)
  Echocardiogram 2D Echocardiogram has been performed.  Lana Fish 02/25/2022, 11:02 AM

## 2022-02-25 NOTE — H&P (Addendum)
History and Physical    [address] Boneta Lucks. FBP:[id number] DOB: [birth year] DOA: 02/25/2022  Referring MD/NP/PA:   PCP: Danae Orleans, MD   Patient coming from:  The patient is coming from home.  At baseline, pt is independent for most of ADL.        Chief Complaint: SOB  HPI: Oscar Moody. is a 52 y.o. 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  Pt states that he missed dialysis yesterday due to not feeling well.  He developed shortness of breath, which has been progressively worsening.  He has chest tightness, denies active chest pain.  Patient has dry cough.  No nausea vomiting, diarrhea or abdominal pain.  No symptoms of UTI. Patient is normally not wearing oxygen at baseline, but was found to have severe respiratory distress. At one point EMS had patient on CPAP but took him off secondary to hypotension. Pt continues to have respiratory distress in ED, cannot speak in full sentence, using accessory muscle for breathing.  BiPAP is started in ED. patient is lethargic, but arousable, oriented x 3.  Data reviewed independently and ED Course: pt was found to have WBC 5.6, negative PCR for COVID, flu and RSV, potassium 4.8, bicarbonate 24, creatinine 10.30, BUN 48.  Temperature normal, blood pressure 175/94 --> 180/93 (118). heart rate 88, RR 16.  Chest x-ray negative.  Patient is admitted to PCU as inpatient.  Dr. Holley Raring of  renal is consulted for urgent dialysis   EKG: I have personally reviewed.  Sinus rhythm, QTc 445, mild T wave inversion in V4-V6, early R wave progression   Review of Systems:   General: no fevers, chills, no body weight gain, fatigue HEENT: no blurry vision, hearing changes or sore throat Respiratory: has dyspnea, coughing, no wheezing CV: has chest tightness, no palpitations GI: no nausea, vomiting, abdominal pain, diarrhea,  constipation GU: no dysuria, burning on urination, increased urinary frequency, hematuria  Ext: 2+ leg edema Neuro: no unilateral weakness, numbness, or tingling, no vision change or hearing loss Skin: no rash, no skin tear. MSK: No muscle spasm, no deformity, no limitation of range of movement in spin Heme: No easy bruising.  Travel history: No recent long distant travel.   Allergy:  Allergies  Allergen Reactions   Iodinated Contrast Media Other (See Comments)    hypotension   Oxycodone-Acetaminophen Itching    Past Medical History:  Diagnosis Date   Anxiety    Arthritis    Asthma    childhood asthma   Chronic kidney disease    Current every day smoker    DVT (deep venous thrombosis) (HCC)    Dyspnea    with exertion   GERD (gastroesophageal reflux disease)    Headache    Hypercholesteremia    Hypertension    Peripheral vascular disease ([place])     Past Surgical History:  Procedure Laterality Date   AV FISTULA PLACEMENT Right 12/26/2015   Procedure: ARTERIOVENOUS (AV) FISTULA CREATION ( RADIAL CEPHALIC );  Surgeon: Katha Cabal, MD;  Location: [facility] ORS;  Service: Vascular;  Laterality: Right;   FRACTURE SURGERY Left    screw in left wrist   LOWER EXTREMITY ANGIOGRAPHY Right 07/30/2021   Procedure: Lower Extremity Angiography;  Surgeon: Katha Cabal, MD;  Location: [place] [facility];  Service: Cardiovascular;  Laterality: Right;   PERIPHERAL VASCULAR CATHETERIZATION Right 03/28/2016   Procedure: A/V  Fistulagram;  Surgeon: Katha Cabal, MD;  Location: [place] [facility];  Service: Cardiovascular;  Laterality: Right;   PERIPHERAL VASCULAR CATHETERIZATION N/A 03/28/2016   Procedure: A/V Shunt Intervention;  Surgeon: Katha Cabal, MD;  Location: [place] [facility];  Service: Cardiovascular;  Laterality: N/A;   VASCULAR SURGERY Left 07/2015   stent in left thigh, Location:[place]    Social History:  reports that he has been smoking cigarettes. He  has been smoking an average of .25 packs per day. He has never used smokeless tobacco. He reports current drug use. Drug: Marijuana. He reports that he does not drink alcohol.  Family History:  Family History  Problem Relation Age of Onset   Hypertension Mother    Polycystic kidney disease Mother    Hypertension Sister      Prior to Admission medications   Medication Sig Start Date End Date Taking? Authorizing Provider  calcium acetate (PHOSLO) 667 MG tablet Take 2,001 mg by mouth 3 (three) times daily. 01/11/22  Yes [provider]  HYDROcodone-acetaminophen (NORCO) 10-325 MG tablet Take 1 tablet by mouth every 6 (six) hours. 01/29/22  Yes [provider]  HYDROmorphone (DILAUDID) 4 MG tablet Take by mouth. 01/08/22  Yes [provider]  VELTASSA 8.4 g packet Take by mouth. 11/29/21  Yes [provider]  amLODipine (NORVASC) 10 MG tablet Take 10 mg by mouth daily. 09/14/18   [provider]  aspirin EC 81 MG tablet Take 1 tablet (81 mg total) by mouth daily. Swallow whole. 07/30/21 07/30/22  Schnier, Dolores Lory, MD  baclofen (LIORESAL) 10 MG tablet TAKE 1 TABLET (10 MG TOTAL) BY MOUTH TWO (2) TIMES A DAY. Patient not taking: Reported on 07/04/2021 04/16/16   [provider]  calcium acetate (PHOSLO) 667 MG capsule TAKE 3 CAPSULES BY MOUTH THREE TIMES DAILY WITH MEALS AND 1 CAPSULE WITH SNACKS 09/07/18   [provider]  clopidogrel (PLAVIX) 75 MG tablet Take 75 mg by mouth daily.    [provider]  furosemide (LASIX) 40 MG tablet Take 40 mg by mouth 2 (two) times daily. 10/24/20   [provider]  furosemide (LASIX) 80 MG tablet Take 80 mg by mouth 2 (two) times daily.    [provider]  gabapentin (NEURONTIN) 100 MG capsule Take by mouth. 01/07/22 02/06/22  [provider]  HYDROcodone-acetaminophen (NORCO) 7.5-325 MG tablet Take 1 tablet by mouth every 6 (six) hours as needed. 02/15/21   [provider]  lisinopril (PRINIVIL,ZESTRIL) 40 MG tablet Take 40 mg by mouth daily.    [provider]  methocarbamol (ROBAXIN) 750 MG tablet Take 750 mg by mouth 3 (three) times daily. 01/07/22   [provider]  naloxone [facility]) nasal spray 4 mg/0.1 mL 1 spray as directed. 10/03/20   [provider]  omeprazole (PRILOSEC) 40 MG capsule Take 40 mg by mouth daily. 01/29/21   [provider]  simvastatin (ZOCOR) 10 MG tablet Take 10 mg by mouth daily. 02/12/21   [provider]  sodium bicarbonate 650 MG tablet Take 650 mg by mouth 2 (two) times daily. 10/20/20   [provider]  XIAFLEX 0.9 MG SOLR  03/18/21   [provider]    Physical Exam: Vitals:   02/25/22 1500 02/25/22 1510 02/25/22 1530 02/25/22 1702  BP: (!) 181/97 (!) 189/86 (!) 170/96   Pulse: 73 78 78   Resp: (!) 23 13 20    Temp:   97.7 F (  36.5 C) 98 F (36.7 C)  TempSrc:   Oral   SpO2: 99% 99% 98%   Weight:   97.7 kg   Height:       General: In acute respiratory distress HEENT:       Eyes: PERRL, EOMI, no scleral icterus.       ENT: No discharge from the ears and nose, no pharynx injection, no tonsillar enlargement.        Neck: No JVD, no bruit, no mass felt. Heme: No neck lymph node enlargement. Cardiac: S1/S2, RRR, No murmurs, No gallops or rubs. Respiratory: Fine crackles bilaterally GI: Soft, nondistended, nontender, no rebound pain, no organomegaly, BS present. GU: No hematuria Ext: 2+ pitting leg edema bilaterally. 1+DP/PT pulse bilaterally. Musculoskeletal: No joint deformities, No joint redness or warmth, no limitation of ROM in spin. Skin: No rashes.  Neuro: patient is still lethargic, but arousable, oriented x 3. cranial nerves II-XII grossly intact, moves all extremities normally.  Psych: Patient is not psychotic, no suicidal or hemocidal ideation.  Labs on Admission: I have personally reviewed following labs and imaging  studies  CBC: Recent Labs  Lab 02/25/22 0207  WBC 5.6  NEUTROABS 4.3  HGB 10.3*  HCT 34.8*  MCV 97.8  PLT 299   Basic Metabolic Panel: Recent Labs  Lab 02/25/22 0207  NA 137  K 4.8  CL 97*  CO2 24  GLUCOSE 104*  BUN 48*  CREATININE 10.30*  CALCIUM 9.8   GFR: Estimated Creatinine Clearance: 10 mL/min (A) (by C-G formula based on SCr of 10.3 mg/dL (H)). Liver Function Tests: Recent Labs  Lab 02/25/22 0207  AST 16  ALT 8  ALKPHOS 56  BILITOT 0.8  PROT 7.0  ALBUMIN 3.9   No results for input(s): "LIPASE", "AMYLASE" in the last 168 hours. No results for input(s): "AMMONIA" in the last 168 hours. Coagulation Profile: No results for input(s): "INR", "PROTIME" in the last 168 hours. Cardiac Enzymes: No results for input(s): "CKTOTAL", "CKMB", "CKMBINDEX", "TROPONINI" in the last 168 hours. BNP (last 3 results) No results for input(s): "PROBNP" in the last 8760 hours. HbA1C: No results for input(s): "HGBA1C" in the last 72 hours. CBG: No results for input(s): "GLUCAP" in the last 168 hours. Lipid Profile: No results for input(s): "CHOL", "HDL", "LDLCALC", "TRIG", "CHOLHDL", "LDLDIRECT" in the last 72 hours. Thyroid Function Tests: No results for input(s): "TSH", "T4TOTAL", "FREET4", "T3FREE", "THYROIDAB" in the last 72 hours. Anemia Panel: No results for input(s): "VITAMINB12", "FOLATE", "FERRITIN", "TIBC", "IRON", "RETICCTPCT" in the last 72 hours. Urine analysis:    Component Value Date/Time   APPEARANCEUR Clear 02/28/2021 1103   GLUCOSEU 1+ (A) 02/28/2021 1103   BILIRUBINUR Negative 02/28/2021 1103   PROTEINUR 3+ (A) 02/28/2021 1103   NITRITE Negative 02/28/2021 1103   LEUKOCYTESUR Negative 02/28/2021 1103   Sepsis Labs: @LABRCNTIP (procalcitonin:4,lacticidven:4) ) Recent Results (from the past 240 hour(s))  Resp panel by RT-PCR (RSV, Flu A&B, Covid) Anterior Nasal Swab     Status: None   Collection Time: 02/25/22  2:07 AM   Specimen: Anterior Nasal  Swab  Result Value Ref Range Status   SARS Coronavirus 2 by RT PCR NEGATIVE NEGATIVE Final    Comment: (NOTE) SARS-CoV-2 target nucleic acids are NOT DETECTED.  The SARS-CoV-2 RNA is generally detectable in upper respiratory specimens during the acute phase of infection. The lowest concentration of SARS-CoV-2 viral copies this assay can detect is 138 copies/mL. A negative result does not preclude SARS-Cov-2 infection and should not be  used as the sole basis for treatment or other patient management decisions. A negative result may occur with  improper specimen collection/handling, submission of specimen other than nasopharyngeal swab, presence of viral mutation(s) within the areas targeted by this assay, and inadequate number of viral copies(<138 copies/mL). A negative result must be combined with clinical observations, patient history, and epidemiological information. The expected result is Negative.  Fact Sheet for Patients:  EntrepreneurPulse.com.au  Fact Sheet for Healthcare Providers:  IncredibleEmployment.be  This test is no t yet approved or cleared by the [place] FDA and  has been authorized for detection and/or diagnosis of SARS-CoV-2 by FDA under an Emergency Use Authorization (EUA). This EUA will remain  in effect (meaning this test can be used) for the duration of the COVID-19 declaration under Section 564(b)(1) of the Act, 21 U.S.C.section 360bbb-3(b)(1), unless the authorization is terminated  or revoked sooner.       Influenza A by PCR NEGATIVE NEGATIVE Final   Influenza B by PCR NEGATIVE NEGATIVE Final    Comment: (NOTE) The Xpert Xpress SARS-CoV-2/FLU/RSV plus assay is intended as an aid in the diagnosis of influenza from Nasopharyngeal swab specimens and should not be used as a sole basis for treatment. Nasal washings and aspirates are unacceptable for Xpert Xpress SARS-CoV-2/FLU/RSV testing.  Fact Sheet for  Patients: EntrepreneurPulse.com.au  Fact Sheet for Healthcare Providers: IncredibleEmployment.be  This test is not yet approved or cleared by the [place] FDA and has been authorized for detection and/or diagnosis of SARS-CoV-2 by FDA under an Emergency Use Authorization (EUA). This EUA will remain in effect (meaning this test can be used) for the duration of the COVID-19 declaration under Section 564(b)(1) of the Act, 21 U.S.C. section 360bbb-3(b)(1), unless the authorization is terminated or revoked.     Resp Syncytial Virus by PCR NEGATIVE NEGATIVE Final    Comment: (NOTE) Fact Sheet for Patients: EntrepreneurPulse.com.au  Fact Sheet for Healthcare Providers: IncredibleEmployment.be  This test is not yet approved or cleared by the [place] FDA and has been authorized for detection and/or diagnosis of SARS-CoV-2 by FDA under an Emergency Use Authorization (EUA). This EUA will remain in effect (meaning this test can be used) for the duration of the COVID-19 declaration under Section 564(b)(1) of the Act, 21 U.S.C. section 360bbb-3(b)(1), unless the authorization is terminated or revoked.  Performed at [facility], [address], [place], [place] [address]      Radiological Exams on Admission: ECHOCARDIOGRAM COMPLETE  Result Date: 02/25/2022    ECHOCARDIOGRAM REPORT   Patient Name:   Oscar Moody. Date of Exam: 02/25/2022 Medical Rec #:  [id number]          Height:       72.0 in Accession #:    [id number]         Weight:       210.0 lb Date of Birth:  1971-01-11           BSA:          2.175 m Patient Age:    36 years           BP:           144/82 mmHg Patient Gender: M                  HR:           76 bpm. Exam Location:  [facility] Procedure: 2D Echo and Intracardiac Opacification Agent Indications:    CHF  History:        Patient has no prior history of Echocardiogram examinations.                  Risk Factors:Hypertension, Dyslipidemia and Current Smoker.  Sonographer:    Harvie Junior Referring Phys: [id number] JAN A MANSY  Sonographer Comments: Technically difficult study due to poor echo windows and echo performed with patient supine and on artificial respirator. Image acquisition challenging due to respiratory motion. IMPRESSIONS  1. There are multiple hypo-/anechoic structives in the visualized liver, incompletely characterized. Dedicated abdominal ultrasound or cross-sectional imaging recommended.  2. Left ventricular ejection fraction, by estimation, is 35 to 40%. The left ventricle has moderately decreased function. The left ventricle demonstrates global hypokinesis. The left ventricular internal cavity size was mildly dilated. Left ventricular diastolic parameters are indeterminate.  3. Right ventricular systolic function is normal. The right ventricular size is normal. Mildly increased right ventricular wall thickness. There is mildly elevated pulmonary artery systolic pressure. The estimated right ventricular systolic pressure is 34.0 mmHg.  4. Left atrial size was mildly dilated.  5. A small pericardial effusion is present.  6. The mitral valve is normal in structure. Trivial mitral valve regurgitation.  7. The aortic valve has an indeterminant number of cusps. Aortic valve regurgitation is mild to moderate. No aortic stenosis is present.  8. The inferior vena cava is dilated in size with <50% respiratory variability, suggesting right atrial pressure of 15 mmHg. FINDINGS  Left Ventricle: Left ventricular ejection fraction, by estimation, is 35 to 40%. The left ventricle has moderately decreased function. The left ventricle demonstrates global hypokinesis. Definity contrast agent was given IV to delineate the left ventricular endocardial borders. The left ventricular internal cavity size was mildly dilated. There is no left ventricular hypertrophy. Left ventricular diastolic parameters are  indeterminate. Right Ventricle: The right ventricular size is normal. Mildly increased right ventricular wall thickness. Right ventricular systolic function is normal. There is mildly elevated pulmonary artery systolic pressure. The tricuspid regurgitant velocity is 2.60 m/s, and with an assumed right atrial pressure of 15 mmHg, the estimated right ventricular systolic pressure is 37.0 mmHg. Left Atrium: Left atrial size was mildly dilated. Right Atrium: Right atrial size was normal in size. Pericardium: A small pericardial effusion is present. Mitral Valve: The mitral valve is normal in structure. Trivial mitral valve regurgitation. Tricuspid Valve: The tricuspid valve is grossly normal. Tricuspid valve regurgitation is trivial. Aortic Valve: The aortic valve has an indeterminant number of cusps. Aortic valve regurgitation is mild to moderate. Aortic regurgitation PHT measures 383 msec. No aortic stenosis is present. Aortic valve mean gradient measures 2.0 mmHg. Aortic valve peak gradient measures 4.6 mmHg. Aortic valve area, by VTI measures 3.34 cm. Pulmonic Valve: The pulmonic valve was not well visualized. Pulmonic valve regurgitation is not visualized. No evidence of pulmonic stenosis. Aorta: The aortic root is normal in size and structure. Pulmonary Artery: The pulmonary artery is of normal size. Venous: The inferior vena cava is dilated in size with less than 50% respiratory variability, suggesting right atrial pressure of 15 mmHg. IAS/Shunts: The interatrial septum was not well visualized. Additional Comments: There are multiple hypo-/anechoic structives in the visualized liver, incompletely characterized. Dedicated abdominal ultrasound or cross-sectional imaging recommended.  LEFT VENTRICLE PLAX 2D LVIDd:         5.70 cm      Diastology LVIDs:         4.50 cm      LV e' medial:  7.83 cm/s LV PW:         0.90 cm      LV E/e' medial:  17.4 LV IVS:        0.90 cm      LV e' lateral:   9.14 cm/s LVOT diam:      2.10 cm      LV E/e' lateral: 14.9 LV SV:         64 LV SV Index:   29 LVOT Area:     3.46 cm  LV Volumes (MOD) LV vol d, MOD A2C: 201.0 ml LV vol d, MOD A4C: 183.0 ml LV vol s, MOD A2C: 124.5 ml LV vol s, MOD A4C: 106.7 ml LV SV MOD A2C:     76.5 ml LV SV MOD A4C:     183.0 ml LV SV MOD BP:      80.6 ml RIGHT VENTRICLE RV Basal diam:  3.70 cm RV Mid diam:    3.60 cm RV S prime:     12.70 cm/s TAPSE (M-mode): 2.2 cm LEFT ATRIUM             Index        RIGHT ATRIUM           Index LA diam:        3.40 cm 1.56 cm/m   RA Area:     11.40 cm LA Vol (A2C):   49.6 ml 22.80 ml/m  RA Volume:   23.60 ml  10.85 ml/m LA Vol (A4C):   79.4 ml 36.50 ml/m LA Biplane Vol: 66.0 ml 30.34 ml/m  AORTIC VALVE                    PULMONIC VALVE AV Area (Vmax):    3.19 cm     PV Vmax:       0.90 m/s AV Area (Vmean):   2.92 cm     PV Peak grad:  3.2 mmHg AV Area (VTI):     3.34 cm AV Vmax:           107.00 cm/s AV Vmean:          73.100 cm/s AV VTI:            0.192 m AV Peak Grad:      4.6 mmHg AV Mean Grad:      2.0 mmHg LVOT Vmax:         98.50 cm/s LVOT Vmean:        61.600 cm/s LVOT VTI:          0.185 m LVOT/AV VTI ratio: 0.96 AI PHT:            383 msec  AORTA Ao Root diam: 3.60 cm MITRAL VALVE                TRICUSPID VALVE MV Area (PHT): 5.14 cm     TR Peak grad:   27.0 mmHg MV Decel Time: 148 msec     TR Vmax:        260.00 cm/s MR Peak grad: 28.6 mmHg MR Vmax:      267.50 cm/s   SHUNTS MV E velocity: 136.00 cm/s  Systemic VTI:  0.18 m MV A velocity: 38.25 cm/s   Systemic Diam: 2.10 cm MV E/A ratio:  3.56 Harrell Gave End MD Electronically signed by Nelva Bush MD Signature Date/Time: 02/25/2022/3:51:38 PM    Final    DG Chest Port 1 View  Result Date: 02/25/2022 CLINICAL  DATA:  Shortness of breath EXAM: PORTABLE CHEST 1 VIEW COMPARISON:  None Available. FINDINGS: The heart size and mediastinal contours are within normal limits. Both lungs are clear. The visualized skeletal structures are unremarkable. IMPRESSION:  No active disease. Electronically Signed   By: Ulyses Jarred M.D.   On: 02/25/2022 02:14      Assessment/Plan Principal Problem:   Fluid overload Active Problems:   Acute respiratory failure with hypoxia (HCC)   ESRD on dialysis [facility])   Essential hypertension   Hypertensive urgency   Hyperlipidemia   Myocardial injury   Tobacco dependence   Assessment and Plan:  Acute respiratory failure with hypoxia due to fluid overload: Consulted Dr. Holley Raring of renal for urgent dialysis.  After dialysis, patient feels much better, but his blood pressure is persistently elevated to 180/93 (118), still has hypertensive urgency.  Patient insisted on going home. I explained the importance of being treated for his blood pressure before he can go home.  I told him, if his blood pressure going up further, he has risk of developing heart attack, stroke and even death.  He fully understand the risk.  He decided to sign AMA and left hospital.  -Patient was admitted to PCU as inpatient -Bronchodilator -Started to BiPAP -Consulted Dr. Holley Raring for urgent dialysis  ESRD on dialysis [facility]) -Patient had a urgent dialysis  Essential hypertension and hypertensive urgency: -Amlodipine, lisinopril -IV hydralazine as needed  Hyperlipidemia -Zocor  Myocardial injury: Troponin level 25 --> 33.  No chest pain -Continue aspirin, Plavix, Zocor  Tobacco dependence: -needs nicotine patch      DVT ppx: SQ Heparin     Code Status: Full code  Family Communication: not done, no family member is at bed side.   Disposition Plan:  Anticipate discharge back to previous environment  Consults called:  Dr. Holley Raring of renal  Admission status and Level of care: Progressive:     as inpt       Dispo: The patient is from: Home              Anticipated d/c is to: Home              Anticipated d/c date is: 2 days              Patient currently is not medically stable to d/c.    Severity of Illness:  The  appropriate patient status for this patient is INPATIENT. Inpatient status is judged to be reasonable and necessary in order to provide the required intensity of service to ensure the patient's safety. The patient's presenting symptoms, physical exam findings, and initial radiographic and laboratory data in the context of their chronic comorbidities is felt to place them at high risk for further clinical deterioration. Furthermore, it is not anticipated that the patient will be medically stable for discharge from the hospital within 2 midnights of admission.   * I certify that at the point of admission it is my clinical judgment that the patient will require inpatient hospital care spanning beyond 2 midnights from the point of admission due to high intensity of service, high risk for further deterioration and high frequency of surveillance required.*       Date of Service 02/25/2022    Ivor Costa [facility]   If 7PM-7AM, please contact night-coverage [email or web address] 02/25/2022, 6:05 PM
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
## 2022-02-25 NOTE — ED Notes (Signed)
Called [facility] to transport PT to dialysis.

## 2022-02-25 NOTE — ED Notes (Signed)
Dialysis called and they are getting prepped for the pt.

## 2022-02-26 LAB — HEPATITIS B SURFACE ANTIGEN: Hepatitis B Surface Ag: NONREACTIVE

## 2022-02-27 ENCOUNTER — Ambulatory Visit (INDEPENDENT_AMBULATORY_CARE_PROVIDER_SITE_OTHER): Payer: Medicare Other

## 2022-02-27 ENCOUNTER — Ambulatory Visit (INDEPENDENT_AMBULATORY_CARE_PROVIDER_SITE_OTHER): Payer: Medicare Other | Admitting: Nurse Practitioner

## 2022-02-27 ENCOUNTER — Encounter (INDEPENDENT_AMBULATORY_CARE_PROVIDER_SITE_OTHER): Payer: Self-pay | Admitting: Nurse Practitioner

## 2022-02-27 VITALS — BP 167/87 | HR 82 | Resp 16 | Ht 72.0 in | Wt 211.0 lb

## 2022-02-27 DIAGNOSIS — I739 Peripheral vascular disease, unspecified: Secondary | ICD-10-CM

## 2022-02-27 DIAGNOSIS — I70211 Atherosclerosis of native arteries of extremities with intermittent claudication, right leg: Secondary | ICD-10-CM | POA: Diagnosis not present

## 2022-02-27 DIAGNOSIS — Z9889 Other specified postprocedural states: Secondary | ICD-10-CM | POA: Diagnosis not present

## 2022-02-27 DIAGNOSIS — E782 Mixed hyperlipidemia: Secondary | ICD-10-CM | POA: Diagnosis not present

## 2022-02-27 DIAGNOSIS — I10 Essential (primary) hypertension: Secondary | ICD-10-CM | POA: Diagnosis not present

## 2022-02-28 LAB — HEPATITIS B SURFACE ANTIBODY, QUANTITATIVE: Hep B S AB Quant (Post): 10.5 m[IU]/mL (ref >9.9)

## 2022-03-03 ENCOUNTER — Encounter (INDEPENDENT_AMBULATORY_CARE_PROVIDER_SITE_OTHER): Payer: Self-pay | Admitting: Nurse Practitioner

## 2022-03-03 LAB — VAS US ABI WITH/WO TBI
Left ABI: 1.11
Right ABI: 1.09
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
## 2022-03-21 ENCOUNTER — Emergency Department: Payer: 59

## 2022-03-21 ENCOUNTER — Other Ambulatory Visit: Payer: Self-pay

## 2022-03-21 ENCOUNTER — Observation Stay
Admission: EM | Admit: 2022-03-21 | Discharge: 2022-03-22 | Disposition: A | Discharge status: Home / Self Care | Payer: 59 | Attending: Internal Medicine | Admitting: Internal Medicine

## 2022-03-21 DIAGNOSIS — Z86718 Personal history of other venous thrombosis and embolism: Secondary | ICD-10-CM | POA: Insufficient documentation

## 2022-03-21 DIAGNOSIS — Z7902 Long term (current) use of antithrombotics/antiplatelets: Secondary | ICD-10-CM | POA: Insufficient documentation

## 2022-03-21 DIAGNOSIS — Z79899 Other long term (current) drug therapy: Secondary | ICD-10-CM | POA: Insufficient documentation

## 2022-03-21 DIAGNOSIS — R519 Headache, unspecified: Secondary | ICD-10-CM | POA: Diagnosis not present

## 2022-03-21 DIAGNOSIS — F32A Depression, unspecified: Secondary | ICD-10-CM | POA: Insufficient documentation

## 2022-03-21 DIAGNOSIS — I2510 Atherosclerotic heart disease of native coronary artery without angina pectoris: Secondary | ICD-10-CM | POA: Insufficient documentation

## 2022-03-21 DIAGNOSIS — F17210 Nicotine dependence, cigarettes, uncomplicated: Secondary | ICD-10-CM | POA: Diagnosis not present

## 2022-03-21 DIAGNOSIS — J45909 Unspecified asthma, uncomplicated: Secondary | ICD-10-CM | POA: Insufficient documentation

## 2022-03-21 DIAGNOSIS — J810 Acute pulmonary edema: Secondary | ICD-10-CM | POA: Diagnosis not present

## 2022-03-21 DIAGNOSIS — I5023 Acute on chronic systolic (congestive) heart failure: Secondary | ICD-10-CM

## 2022-03-21 DIAGNOSIS — Z7982 Long term (current) use of aspirin: Secondary | ICD-10-CM | POA: Insufficient documentation

## 2022-03-21 DIAGNOSIS — I739 Peripheral vascular disease, unspecified: Secondary | ICD-10-CM | POA: Diagnosis not present

## 2022-03-21 DIAGNOSIS — I10 Essential (primary) hypertension: Secondary | ICD-10-CM | POA: Diagnosis present

## 2022-03-21 DIAGNOSIS — Z992 Dependence on renal dialysis: Secondary | ICD-10-CM | POA: Insufficient documentation

## 2022-03-21 DIAGNOSIS — I132 Hypertensive heart and chronic kidney disease with heart failure and with stage 5 chronic kidney disease, or end stage renal disease: Secondary | ICD-10-CM | POA: Insufficient documentation

## 2022-03-21 DIAGNOSIS — E8770 Fluid overload, unspecified: Secondary | ICD-10-CM

## 2022-03-21 DIAGNOSIS — J9601 Acute respiratory failure with hypoxia: Principal | ICD-10-CM | POA: Insufficient documentation

## 2022-03-21 DIAGNOSIS — R0602 Shortness of breath: Secondary | ICD-10-CM | POA: Diagnosis present

## 2022-03-21 DIAGNOSIS — Z1152 Encounter for screening for COVID-19: Secondary | ICD-10-CM | POA: Insufficient documentation

## 2022-03-21 DIAGNOSIS — Z8679 Personal history of other diseases of the circulatory system: Secondary | ICD-10-CM | POA: Insufficient documentation

## 2022-03-21 DIAGNOSIS — E785 Hyperlipidemia, unspecified: Secondary | ICD-10-CM | POA: Diagnosis present

## 2022-03-21 DIAGNOSIS — N186 End stage renal disease: Secondary | ICD-10-CM | POA: Diagnosis not present

## 2022-03-21 DIAGNOSIS — K219 Gastro-esophageal reflux disease without esophagitis: Secondary | ICD-10-CM | POA: Insufficient documentation

## 2022-03-21 LAB — URINALYSIS, COMPLETE (UACMP) WITH MICROSCOPIC
Bilirubin Urine: NEGATIVE
Glucose, UA: 150 mg/dL — AB
Ketones, ur: NEGATIVE mg/dL
Leukocytes,Ua: NEGATIVE
Nitrite: NEGATIVE
Protein, ur: 100 mg/dL — AB
Specific Gravity, Urine: 1.005 (ref 1.005–1.030)
WBC, UA: NONE SEEN WBC/hpf (ref 0–5)
pH: 9 — ABNORMAL HIGH (ref 5.0–8.0)

## 2022-03-21 LAB — COMPREHENSIVE METABOLIC PANEL
ALT: 9 U/L (ref 0–44)
AST: 17 U/L (ref 15–41)
Albumin: 3.9 g/dL (ref 3.5–5.0)
Alkaline Phosphatase: 52 U/L (ref 38–126)
Anion gap: 13 (ref 5–15)
BUN: 13 mg/dL (ref 6–20)
CO2: 24 mmol/L (ref 22–32)
Calcium: 9.5 mg/dL (ref 8.9–10.3)
Chloride: 100 mmol/L (ref 98–111)
Creatinine, Ser: 4.25 mg/dL — ABNORMAL HIGH (ref 0.61–1.24)
GFR, Estimated: 16 mL/min — ABNORMAL LOW (ref >60)
Glucose, Bld: 78 mg/dL (ref 70–99)
Potassium: 3.8 mmol/L (ref 3.5–5.1)
Sodium: 137 mmol/L (ref 135–145)
Total Bilirubin: 0.8 mg/dL (ref 0.3–1.2)
Total Protein: 6.8 g/dL (ref 6.5–8.1)

## 2022-03-21 LAB — CBC WITH DIFFERENTIAL/PLATELET
Abs Immature Granulocytes: 0.01 10*3/uL (ref 0.00–0.07)
Basophils Absolute: 0.1 10*3/uL (ref 0.0–0.1)
Basophils Relative: 1 %
Eosinophils Absolute: 0.1 10*3/uL (ref 0.0–0.5)
Eosinophils Relative: 3 %
HCT: 39.8 % (ref 39.0–52.0)
Hemoglobin: 11.9 g/dL — ABNORMAL LOW (ref 13.0–17.0)
Immature Granulocytes: 0 %
Lymphocytes Relative: 22 %
Lymphs Abs: 1 10*3/uL (ref 0.7–4.0)
MCH: 29.5 pg (ref 26.0–34.0)
MCHC: 29.9 g/dL — ABNORMAL LOW (ref 30.0–36.0)
MCV: 98.5 fL (ref 80.0–100.0)
Monocytes Absolute: 0.4 10*3/uL (ref 0.1–1.0)
Monocytes Relative: 8 %
Neutro Abs: 2.9 10*3/uL (ref 1.7–7.7)
Neutrophils Relative %: 66 %
Platelets: 164 10*3/uL (ref 150–400)
RBC: 4.04 MIL/uL — ABNORMAL LOW (ref 4.22–5.81)
RDW: 14.7 % (ref 11.5–15.5)
WBC: 4.4 10*3/uL (ref 4.0–10.5)
nRBC: 0 % (ref 0.0–0.2)

## 2022-03-21 LAB — RESP PANEL BY RT-PCR (RSV, FLU A&B, COVID)  RVPGX2
Influenza A by PCR: NEGATIVE
Influenza B by PCR: NEGATIVE
Resp Syncytial Virus by PCR: NEGATIVE
SARS Coronavirus 2 by RT PCR: NEGATIVE

## 2022-03-21 LAB — TROPONIN I (HIGH SENSITIVITY)
Troponin I (High Sensitivity): 16 ng/L (ref <18)
Troponin I (High Sensitivity): 18 ng/L — ABNORMAL HIGH (ref <18)

## 2022-03-21 LAB — LIPASE, BLOOD: Lipase: 29 U/L (ref 11–51)

## 2022-03-21 LAB — LACTIC ACID, PLASMA
Lactic Acid, Venous: 1.1 mmol/L (ref 0.5–1.9)
Lactic Acid, Venous: 1.2 mmol/L (ref 0.5–1.9)

## 2022-03-21 LAB — BRAIN NATRIURETIC PEPTIDE: B Natriuretic Peptide: 956.1 pg/mL — ABNORMAL HIGH (ref 0.0–100.0)

## 2022-03-21 MED ORDER — PANTOPRAZOLE SODIUM 40 MG PO TBEC
40.0000 mg | DELAYED_RELEASE_TABLET | Freq: Every day | ORAL | Status: DC
  Administered 2022-03-22: 40 mg via ORAL
  Filled 2022-03-21: qty 1

## 2022-03-21 MED ORDER — ONDANSETRON HCL 4 MG PO TABS: 4.0000 mg | ORAL_TABLET | Freq: Four times a day (QID) | ORAL | Status: DC | PRN

## 2022-03-21 MED ORDER — FUROSEMIDE 10 MG/ML IJ SOLN
120.0000 mg | Freq: Once | INTRAVENOUS | Status: AC
  Administered 2022-03-21: 120 mg via INTRAVENOUS
  Filled 2022-03-21: qty 12

## 2022-03-21 MED ORDER — LISINOPRIL 10 MG PO TABS
40.0000 mg | ORAL_TABLET | Freq: Every day | ORAL | Status: DC
  Administered 2022-03-22: 40 mg via ORAL
  Filled 2022-03-21: qty 4

## 2022-03-21 MED ORDER — ACETAMINOPHEN 325 MG PO TABS
650.0000 mg | ORAL_TABLET | Freq: Four times a day (QID) | ORAL | Status: DC | PRN
  Filled 2022-03-21: qty 2

## 2022-03-21 MED ORDER — ASPIRIN 81 MG PO TBEC
81.0000 mg | DELAYED_RELEASE_TABLET | Freq: Every day | ORAL | Status: DC
  Administered 2022-03-22: 81 mg via ORAL
  Filled 2022-03-21: qty 1

## 2022-03-21 MED ORDER — ACETAMINOPHEN 325 MG PO TABS
650.0000 mg | ORAL_TABLET | Freq: Once | ORAL | Status: DC
  Filled 2022-03-21: qty 2

## 2022-03-21 MED ORDER — ACETAMINOPHEN 325 MG RE SUPP: 650.0000 mg | Freq: Four times a day (QID) | RECTAL | Status: DC | PRN

## 2022-03-21 MED ORDER — NALOXONE HCL 4 MG/0.1ML NA LIQD: 1.0000 | NASAL | Status: DC

## 2022-03-21 MED ORDER — CALCIUM ACETATE (PHOS BINDER) 667 MG PO CAPS
2001.0000 mg | ORAL_CAPSULE | Freq: Three times a day (TID) | ORAL | Status: DC
  Administered 2022-03-22: 2001 mg via ORAL
  Filled 2022-03-21 (×3): qty 3

## 2022-03-21 MED ORDER — HYDROCODONE-ACETAMINOPHEN 7.5-325 MG PO TABS
1.0000 | ORAL_TABLET | Freq: Four times a day (QID) | ORAL | Status: DC | PRN
  Administered 2022-03-21: 1 via ORAL
  Filled 2022-03-21: qty 1

## 2022-03-21 MED ORDER — GABAPENTIN 100 MG PO CAPS
100.0000 mg | ORAL_CAPSULE | Freq: Every day | ORAL | Status: DC
  Administered 2022-03-21: 100 mg via ORAL
  Filled 2022-03-21: qty 1

## 2022-03-21 MED ORDER — HEPARIN SODIUM (PORCINE) 5000 UNIT/ML IJ SOLN
5000.0000 [IU] | Freq: Three times a day (TID) | INTRAMUSCULAR | Status: DC
  Administered 2022-03-21 – 2022-03-22 (×3): 5000 [IU] via SUBCUTANEOUS
  Filled 2022-03-21 (×3): qty 1

## 2022-03-21 MED ORDER — TRAZODONE HCL 50 MG PO TABS: 25.0000 mg | ORAL_TABLET | Freq: Every evening | ORAL | Status: DC | PRN

## 2022-03-21 MED ORDER — CALCIUM ACETATE (PHOS BINDER) 667 MG PO CAPS: 667.0000 mg | ORAL_CAPSULE | ORAL | Status: DC | PRN

## 2022-03-21 MED ORDER — PATIROMER SORBITEX CALCIUM 8.4 G PO PACK
8.4000 g | PACK | Freq: Every day | ORAL | Status: DC
  Administered 2022-03-22: 8.4 g via ORAL
  Filled 2022-03-21: qty 1

## 2022-03-21 MED ORDER — CLOPIDOGREL BISULFATE 75 MG PO TABS
75.0000 mg | ORAL_TABLET | Freq: Every day | ORAL | Status: DC
  Administered 2022-03-22: 75 mg via ORAL
  Filled 2022-03-21: qty 1

## 2022-03-21 MED ORDER — AMLODIPINE BESYLATE 5 MG PO TABS
10.0000 mg | ORAL_TABLET | Freq: Every day | ORAL | Status: DC
  Administered 2022-03-22: 10 mg via ORAL
  Filled 2022-03-21: qty 2

## 2022-03-21 MED ORDER — SIMVASTATIN 10 MG PO TABS: 10.0000 mg | ORAL_TABLET | Freq: Every day | ORAL | Status: DC

## 2022-03-21 MED ORDER — ONDANSETRON HCL 4 MG/2ML IJ SOLN: 4.0000 mg | Freq: Four times a day (QID) | INTRAMUSCULAR | Status: DC | PRN

## 2022-03-21 MED ORDER — FUROSEMIDE 10 MG/ML IJ SOLN
60.0000 mg | Freq: Two times a day (BID) | INTRAMUSCULAR | Status: DC
  Administered 2022-03-22: 60 mg via INTRAVENOUS
  Filled 2022-03-21: qty 8

## 2022-03-21 MED ORDER — MAGNESIUM HYDROXIDE 400 MG/5ML PO SUSP: 30.0000 mL | Freq: Every day | ORAL | Status: DC | PRN

## 2022-03-21 NOTE — Assessment & Plan Note (Signed)
-  This clearly secondary to fluid overload with ESRD and acute on chronic systolic CHF with subsequent acute hypoxic respiratory failure. - He will be admitted to a cardiac telemetry bed. - He was given 120 mg of IV Lasix and will continue with 60 mg IV every 12 hours pending potential need for hemodialysis in AM. - Nephrology consult will be obtained. - Dr. Holley Raring was notified about the patient. - We will follow serial troponins. - His last EF as mentioned above was 35 to 40% on a 2D echo on 02/25/2022.

## 2022-03-21 NOTE — Assessment & Plan Note (Signed)
-  This is associated with peripheral vascular disease. - We will continue aspirin and Plavix and statin therapy.

## 2022-03-21 NOTE — Assessment & Plan Note (Addendum)
-  Management as above. - We will continue Veltassa.

## 2022-03-21 NOTE — Assessment & Plan Note (Signed)
-  We will continue statin therapy. 

## 2022-03-21 NOTE — H&P (Signed)
[place]   PATIENT NAME: Oscar Moody    MR#:  [id number]  DATE OF BIRTH:  03/28/1970  DATE OF ADMISSION:  03/21/2022  PRIMARY CARE PHYSICIAN: Danae Orleans, MD   Patient is coming from: Home  REQUESTING/REFERRING PHYSICIAN: Merlyn Lot, MD  CHIEF COMPLAINT:   Chief Complaint  Patient presents with   Shortness of Breath    HISTORY OF PRESENT ILLNESS:  Oscar Clinger. is a 52 y.o. Caucasian male with medical history significant for end-stage renal disease on hemodialysis on [address], asthma, DVT, GERD, dyslipidemia and hypertension as well as peripheral vascular disease, who presented to the emergency room with a [name] of worsening dyspnea with associated dry cough and wheezing.  He has been having orthopnea and paroxysmal nocturnal dyspnea with associated lower extremity edema more on the right than the left that is chronic.  He denies any nausea or vomiting or abdominal pain.  Denies any chest pain or palpitations.  He has been having chills.  He admits to headache without dizziness or blurred vision.  He is having low back pain.  His blood pressure dropped during hemodialysis session today.  ED Course: When he came to the ER, BP was 185/87 with pulse symmetry of 97% on 4 L of O2 nasal cannula and otherwise normal vital signs.  Labs revealed a creatinine of 4.25 and a BN of 13 with a potassium of 3.8.  BNP was 956.1 and high sensitive troponin I was 16 and later 18.  Lactic acid was 1.1 and later 1.2.  CBC showed hemoglobin 11.9 hematocrit 39.8.  Influenza, RSV and COVID-19 PCR's came back negative EKG as reviewed by me : Normal sinus rhythm with rate of 86 with left atrial enlargement. Imaging: Portable chest x-ray showed increased interstitial opacities suspicious for pulmonary edema with possible small pleural effusions and stable cardiomegaly.  Noncontrast head CT scan revealed no acute intracranial normalities.  Most recent 2D echo revealed EF of 35 to 40%  with indeterminate diastolic function, small pericardial effusion and mild left atrial dilatation with mild to moderate tricuspid regurgitation and trivial mitral regurgitation.  The patient was given 125 mg of IV Lasix.  Dr. Holley Raring was notified about the patient.  He will be admitted to a cardiac telemetry bed for further evaluation and management.   PAST MEDICAL HISTORY:   Past Medical History:  Diagnosis Date   Anxiety    Arthritis    Asthma    childhood asthma   Chronic kidney disease    Current every day smoker    DVT (deep venous thrombosis) ([place])    Dyspnea    with exertion   GERD (gastroesophageal reflux disease)    Headache    Hypercholesteremia    Hypertension    Peripheral vascular disease ([place])     PAST SURGICAL HISTORY:   Past Surgical History:  Procedure Laterality Date   AV FISTULA PLACEMENT Right 12/26/2015   Procedure: ARTERIOVENOUS (AV) FISTULA CREATION ( RADIAL CEPHALIC );  Surgeon: Katha Cabal, MD;  Location: [facility];  Service: Vascular;  Laterality: Right;   FRACTURE SURGERY Left    screw in left wrist   LOWER EXTREMITY ANGIOGRAPHY Right 07/30/2021   Procedure: Lower Extremity Angiography;  Surgeon: Katha Cabal, MD;  Location: [place] [facility];  Service: Cardiovascular;  Laterality: Right;   PERIPHERAL VASCULAR CATHETERIZATION Right 03/28/2016   Procedure: A/V Fistulagram;  Surgeon: Katha Cabal, MD;  Location: [facility];  Service: Cardiovascular;  Laterality: Right;   PERIPHERAL VASCULAR CATHETERIZATION N/A 03/28/2016   Procedure: A/V Shunt Intervention;  Surgeon: Katha Cabal, MD;  Location: [place] [facility];  Service: Cardiovascular;  Laterality: N/A;   VASCULAR SURGERY Left 07/2015   stent in left thigh, Location:[place]    SOCIAL HISTORY:   Social History   Tobacco Use   Smoking status: Every Day    Packs/day: 0.25    Types: Cigarettes   Smokeless tobacco: Never  Substance Use Topics   Alcohol use: No     FAMILY HISTORY:   Family History  Problem Relation Age of Onset   Hypertension Mother    Polycystic kidney disease Mother    Hypertension Sister     DRUG ALLERGIES:   Allergies  Allergen Reactions   Iodinated Contrast Media Other (See Comments)    hypotension   Oxycodone-Acetaminophen Itching    REVIEW OF SYSTEMS:   ROS As per history of present illness. All pertinent systems were reviewed above. Constitutional, HEENT, cardiovascular, respiratory, GI, GU, musculoskeletal, neuro, psychiatric, endocrine, integumentary and hematologic systems were reviewed and are otherwise negative/unremarkable except for positive findings mentioned above in the HPI.   MEDICATIONS AT HOME:   Prior to Admission medications   Medication Sig Start Date End Date Taking? Authorizing Provider  amLODipine (NORVASC) 10 MG tablet Take 10 mg by mouth daily. 09/14/18   [provider]  aspirin EC 81 MG tablet Take 1 tablet (81 mg total) by mouth daily. Swallow whole. 07/30/21 07/30/22  Schnier, Dolores Lory, MD  baclofen (LIORESAL) 10 MG tablet  04/16/16   [provider]  calcium acetate (PHOSLO) 667 MG capsule TAKE 3 CAPSULES BY MOUTH THREE TIMES DAILY WITH MEALS AND 1 CAPSULE WITH SNACKS 09/07/18   [provider]  calcium acetate (PHOSLO) 667 MG tablet Take 2,001 mg by mouth 3 (three) times daily. 01/11/22   [provider]  clopidogrel (PLAVIX) 75 MG tablet Take 75 mg by mouth daily.    [provider]  furosemide (LASIX) 40 MG tablet Take 40 mg by mouth 2 (two) times daily. 10/24/20   [provider]  furosemide (LASIX) 80 MG tablet Take 80 mg by mouth 2 (two) times daily.    [provider]  gabapentin (NEURONTIN) 100 MG capsule Take by mouth. 01/07/22 02/06/22  [provider]  HYDROcodone-acetaminophen (NORCO) 10-325 MG tablet Take 1 tablet by mouth every 6 (six) hours. 01/29/22   [provider]  HYDROcodone-acetaminophen  (NORCO) 7.5-325 MG tablet Take 1 tablet by mouth every 6 (six) hours as needed. 02/15/21   [provider]  HYDROmorphone (DILAUDID) 4 MG tablet Take by mouth. 01/08/22   [provider]  lisinopril (PRINIVIL,ZESTRIL) 40 MG tablet Take 40 mg by mouth daily.    [provider]  methocarbamol (ROBAXIN) 750 MG tablet Take 750 mg by mouth 3 (three) times daily. 01/07/22   [provider]  naloxone [facility]) nasal spray 4 mg/0.1 mL 1 spray as directed. 10/03/20   [provider]  omeprazole (PRILOSEC) 40 MG capsule Take 40 mg by mouth daily. 01/29/21   [provider]  simvastatin (ZOCOR) 10 MG tablet Take 10 mg by mouth daily. 02/12/21   [provider]  sodium bicarbonate 650 MG tablet Take 650 mg by mouth 2 (two) times daily. 10/20/20   [provider]  VELTASSA 8.4 g packet Take by mouth. 11/29/21   [provider]  XIAFLEX 0.9 MG SOLR  03/18/21  [provider]      VITAL SIGNS:  Blood pressure (!) 158/72, pulse 81, temperature 97.9 F (36.6 C), temperature source Oral, resp. rate 14, height 6' (1.829 m), weight 95.3 kg, SpO2 97 %.  PHYSICAL EXAMINATION:  Physical Exam  GENERAL:  52 y.o.-year-old patient lying in the bed with mild respiratory distress with conversational dyspnea EYES: Pupils equal, round, reactive to light and accommodation. No scleral icterus. Extraocular muscles intact.  HEENT: Head atraumatic, normocephalic. Oropharynx and nasopharynx clear.  NECK:  Supple, no jugular venous distention. No thyroid enlargement, no tenderness.  LUNGS: Diminished bibasal breath sounds with bibasilar rales.  No use of accessory muscles of respiration.  CARDIOVASCULAR: Regular rate and rhythm, S1, S2 normal. No murmurs, rubs, or gallops.  ABDOMEN: Soft, nondistended, nontender. Bowel sounds present. No organomegaly or mass.  EXTREMITIES: Right more than left 2+ pitting lower extremity edema with no clubbing  or cyanosis.  NEUROLOGIC: Cranial nerves II through XII are intact. Muscle strength 5/5 in all extremities. Sensation intact. Gait not checked.  PSYCHIATRIC: The patient is alert and oriented x 3.  Normal affect and good eye contact. SKIN: No obvious rash, lesion, or ulcer.   LABORATORY PANEL:   CBC Recent Labs  Lab 03/21/22 1740  WBC 4.4  HGB 11.9*  HCT 39.8  PLT 164   ------------------------------------------------------------------------------------------------------------------  Chemistries  Recent Labs  Lab 03/21/22 1740  NA 137  K 3.8  CL 100  CO2 24  GLUCOSE 78  BUN 13  CREATININE 4.25*  CALCIUM 9.5  AST 17  ALT 9  ALKPHOS 52  BILITOT 0.8   ------------------------------------------------------------------------------------------------------------------  Cardiac Enzymes No results for input(s): "TROPONINI" in the last 168 hours. ------------------------------------------------------------------------------------------------------------------  RADIOLOGY:  CT HEAD WO CONTRAST (5MM)  Result Date: 03/21/2022 CLINICAL DATA:  Shortness of breath, pulmonary edema EXAM: CT HEAD WITHOUT CONTRAST TECHNIQUE: Contiguous axial images were obtained from the base of the skull through the vertex without intravenous contrast. RADIATION DOSE REDUCTION: This exam was performed according to the departmental dose-optimization program which includes automated exposure control, adjustment of the mA and/or kV according to patient size and/or use of iterative reconstruction technique. COMPARISON:  None Available. FINDINGS: Brain: No evidence of acute infarction, hemorrhage, mass, mass effect, or midline shift. Suspect a small arachnoid cyst along the medial right frontal lobe. No hydrocephalus or acute extra-axial fluid collection. Vascular: No hyperdense vessel. Atherosclerotic calcifications in the intracranial carotid and vertebral arteries. Skull: Negative for fracture or focal lesion.  Sinuses/Orbits: Mucosal thickening in the maxillary sinuses and left frontal sinus. No acute finding in the orbits. Other: The mastoid air cells are well aerated. IMPRESSION: No acute intracranial process. Electronically Signed   By: Merilyn Baba M.D.   On: 03/21/2022 19:40   DG Chest Portable 1 View  Result Date: 03/21/2022 CLINICAL DATA:  Respiratory distress. EXAM: PORTABLE CHEST 1 VIEW COMPARISON:  02/25/2022 FINDINGS: Similar cardiomegaly. Increase in interstitial opacities suspicious for pulmonary edema. Trace fluid in the right minor fissure. There may be small pleural effusions. No confluent airspace disease. No acute osseous abnormalities are seen. IMPRESSION: 1. Increased interstitial opacities suspicious for pulmonary edema. Possible small pleural effusions. 2. Stable cardiomegaly. Electronically Signed   By: Keith Rake M.D.   On: 03/21/2022 17:51      IMPRESSION AND PLAN:  Assessment and Plan: * Acute pulmonary edema (HCC) - This clearly secondary to fluid overload with ESRD and acute on chronic systolic CHF with subsequent acute hypoxic respiratory failure. - He will be  admitted to a cardiac telemetry bed. - He was given 120 mg of IV Lasix and will continue with 60 mg IV every 12 hours pending potential need for hemodialysis in AM. - Nephrology consult will be obtained. - Dr. Holley Raring was notified about the patient. - We will follow serial troponins. - His last EF as mentioned above was 35 to 40% on a 2D echo on 02/25/2022.  ESRD on hemodialysis ([facility]) - Management as above. - We will continue Veltassa.  Dyslipidemia - We will continue statin therapy.  Essential hypertension - We will continue his antihypertensives.  Depression - We will continue Lexapro.  Coronary artery disease - This is associated with peripheral vascular disease. - We will continue aspirin and Plavix and statin therapy.  GERD without esophagitis - We will continue PPI therapy.    DVT  prophylaxis: Subcu heparin. Advanced Care Planning:  Code Status: full code. Family Communication:  The plan of care was discussed in details with the patient (and family). I answered all questions. The patient agreed to proceed with the above mentioned plan. Further management will depend upon hospital course. Disposition Plan: Back to previous home environment Consults called: Nephrology. All the records are reviewed and case discussed with ED provider.  Status is: Inpatient    At the time of the admission, it appears that the appropriate admission status for this patient is inpatient.  This is judged to be reasonable and necessary in order to provide the required intensity of service to ensure the patient's safety given the presenting symptoms, physical exam findings and initial radiographic and laboratory data in the context of comorbid conditions.  The patient requires inpatient status due to high intensity of service, high risk of further deterioration and high frequency of surveillance required.  I certify that at the time of admission, it is my clinical judgment that the patient will require inpatient hospital care extending more than 2 midnights.                            Dispo: The patient is from: Home              Anticipated d/c is to: Home              Patient currently is not medically stable to d/c.              Difficult to place patient: No  Christel Mormon M.D on 03/21/2022 at 8:51 PM  [facility]   From 7 PM-7 AM, contact night-coverage [email or web address]  CC: Primary care physician; Danae Orleans, MD

## 2022-03-21 NOTE — Assessment & Plan Note (Signed)
-  We will continue his antihypertensives. 

## 2022-03-21 NOTE — ED Provider Notes (Addendum)
[facility] Provider Note    None    (approximate)   History   Shortness of Breath   HPI  Oscar Moody. is a 52 y.o. male extensive past medical history including CKD ESRD on dialysis hypertension PVD GERD to the ER for worsening shortness of breath.  States he is feeling okay at dialysis today.  Has had some chills found to be hypoxic in the 80s by first wanders.  Patient arrives on CPAP with significant improvement.  Does feel bloated.  Is complaining of some abdominal discomfort.     Physical Exam   Triage Vital Signs: ED Triage Vitals  Enc Vitals Group     BP 03/21/22 1728 (!) 185/87     Pulse Rate 03/21/22 1728 82     Resp 03/21/22 1728 17     Temp 03/21/22 1728 98.1 F (36.7 C)     Temp Source 03/21/22 1728 Oral     SpO2 03/21/22 1726 99 %     Weight --      Height --      Head Circumference --      Peak Flow --      Pain Score 03/21/22 1728 7     Pain Loc --      Pain Edu? --      Excl. in [place]? --     Most recent vital signs: Vitals:   03/21/22 2000 03/21/22 2030  BP: (!) 156/76 (!) 158/72  Pulse: 81 81  Resp: 20 14  Temp:    SpO2: 98% 97%     Constitutional: Alert  Eyes: Conjunctivae are normal.  Head: Atraumatic. Nose: No congestion/rhinnorhea. Mouth/Throat: Mucous membranes are moist.   Neck: Painless ROM.  Cardiovascular:   Good peripheral circulation. Respiratory: Mild tachypnea with bilateral expiratory crackles. Gastrointestinal: Soft and nontender.  Musculoskeletal:  no deformity Neurologic:  MAE spontaneously. No gross focal neurologic deficits are appreciated.  Skin:  Skin is warm, dry and intact. No rash noted. Psychiatric: Mood and affect are normal. Speech and behavior are normal.    ED Results / Procedures / Treatments   Labs (all labs ordered are listed, but only abnormal results are displayed) Labs Reviewed  CBC WITH DIFFERENTIAL/PLATELET - Abnormal; Notable for the following components:       Result Value   RBC 4.04 (*)    Hemoglobin 11.9 (*)    MCHC 29.9 (*)    All other components within normal limits  COMPREHENSIVE METABOLIC PANEL - Abnormal; Notable for the following components:   Creatinine, Ser 4.25 (*)    GFR, Estimated 16 (*)    All other components within normal limits  BRAIN NATRIURETIC PEPTIDE - Abnormal; Notable for the following components:   B Natriuretic Peptide 956.1 (*)    All other components within normal limits  TROPONIN I (HIGH SENSITIVITY) - Abnormal; Notable for the following components:   Troponin I (High Sensitivity) 18 (*)    All other components within normal limits  RESP PANEL BY RT-PCR (RSV, FLU A&B, COVID)  RVPGX2  LIPASE, BLOOD  LACTIC ACID, PLASMA  LACTIC ACID, PLASMA  BASIC METABOLIC PANEL  CBC  TROPONIN I (HIGH SENSITIVITY)     EKG  ED ECG REPORT I, Merlyn Lot, the attending physician, personally viewed and interpreted this ECG.   Date: 03/21/2022  EKG Time: 17:22  Rate: 80  Rhythm: sinus  Axis: normal  Intervals: normal qt  ST&T Change: no stemi    RADIOLOGY Please  see ED Course for my review and interpretation.  I personally reviewed all radiographic images ordered to evaluate for the above acute complaints and reviewed radiology reports and findings.  These findings were personally discussed with the patient.  Please see medical record for radiology report.    PROCEDURES:  Critical Care performed: Yes, see critical care procedure note(s)  .Critical Care  Performed by: Merlyn Lot, MD Authorized by: Merlyn Lot, MD   Critical care provider statement:    Critical care time (minutes):  35   Critical care was necessary to treat or prevent imminent or life-threatening deterioration of the following conditions:  Respiratory failure   Critical care was time spent personally by me on the following activities:  Ordering and performing treatments and interventions, ordering and review of laboratory  studies, ordering and review of radiographic studies, pulse oximetry, re-evaluation of patient's condition, review of old charts, obtaining history from patient or surrogate, examination of patient, evaluation of patient's response to treatment, discussions with primary provider, discussions with consultants and development of treatment plan with patient or surrogate    MEDICATIONS ORDERED IN ED: Medications  acetaminophen (TYLENOL) tablet 650 mg (650 mg Oral Not Given 03/21/22 1923)  furosemide (LASIX) 120 mg in dextrose 5 % 50 mL IVPB (120 mg Intravenous New Bag/Given 03/21/22 2102)  aspirin EC tablet 81 mg (has no administration in time range)  HYDROcodone-acetaminophen (NORCO) 7.5-325 MG per tablet 1 tablet (has no administration in time range)  amLODipine (NORVASC) tablet 10 mg (has no administration in time range)  lisinopril (ZESTRIL) tablet 40 mg (has no administration in time range)  simvastatin (ZOCOR) tablet 10 mg (has no administration in time range)  calcium acetate (PHOSLO) capsule 667 mg (has no administration in time range)  calcium acetate (PHOSLO) tablet 2,001 mg (has no administration in time range)  pantoprazole (PROTONIX) EC tablet 40 mg (has no administration in time range)  clopidogrel (PLAVIX) tablet 75 mg (has no administration in time range)  patiromer Daryll Drown) packet 8.4 g (has no administration in time range)  gabapentin (NEURONTIN) capsule 100 mg (has no administration in time range)  heparin injection 5,000 Units (has no administration in time range)  acetaminophen (TYLENOL) tablet 650 mg (has no administration in time range)    Or  acetaminophen (TYLENOL) suppository 650 mg (has no administration in time range)  traZODone (DESYREL) tablet 25 mg (has no administration in time range)  magnesium hydroxide (MILK OF MAGNESIA) suspension 30 mL (has no administration in time range)  ondansetron (ZOFRAN) tablet 4 mg (has no administration in time range)    Or   ondansetron (ZOFRAN) injection 4 mg (has no administration in time range)  furosemide (LASIX) injection 60 mg (has no administration in time range)     IMPRESSION / MDM / [place] / ED COURSE  I reviewed the triage vital signs and the nursing notes.                              Differential diagnosis includes, but is not limited to, Asthma, copd, CHF, pna, ptx, malignancy, Pe, anemia  Patient presenting to the ER for evaluation of symptoms as described above.  Based on symptoms, risk factors and considered above differential, this presenting complaint could reflect a potentially life-threatening illness therefore the patient will be placed on continuous pulse oximetry and telemetry for monitoring.  Laboratory evaluation will be sent to evaluate for the above complaints.  Patient  fully found in respiratory distress with EMS but able to relieve to nasal cannula.  Protecting his airway.  X-ray ordered for above differential my review interpretation concerning for volume overload.  He clinically appears volume overloaded.   Clinical Course as of 03/21/22 2124  Ludwig Clarks Mar 21, 2022  1922 Discussed case in consultation with Dr. Holley Raring of nephrology.  Has recommended IV Lasix 120 mg which I have ordered as the patient does still make urine.  Will consult hospitalist for admission.  He does appear stable and appropriate for admission to the hospital. [PR]  1922 Patient was complaining of mild headache given his age and risk factors CT imaging of the head ordered which does not show evidence of IPH or acute abnormality. [PR]    Clinical Course User Index [PR] Merlyn Lot, MD    FINAL CLINICAL IMPRESSION(S) / ED DIAGNOSES   Final diagnoses:  Acute respiratory failure with hypoxia ([place])  Acute pulmonary edema ([place])     Rx / DC Orders   ED Discharge Orders     None        Note:  This document was prepared using Dragon voice recognition software and may include  unintentional dictation errors.    Merlyn Lot, MD 03/21/22 2123    Merlyn Lot, MD 03/21/22 2124

## 2022-03-21 NOTE — Assessment & Plan Note (Signed)
-  We will continue Lexapro.

## 2022-03-21 NOTE — Assessment & Plan Note (Signed)
-  We will continue PPI therapy.

## 2022-03-21 NOTE — ED Triage Notes (Signed)
Pt arrived via [facility] with C/O SOB. Pt had dialysis this morning. Pt was on 15L NRB with an SpO2 in the 80's when EMS arrived on scene from [place]. Pt was put on cpap enroute to [facility].  Pt received 2 duonebs, 1 dose of albuterol, and 125mg  of solumedrol enroute. Pt received dialysis this morning.

## 2022-03-22 DIAGNOSIS — J810 Acute pulmonary edema: Secondary | ICD-10-CM | POA: Diagnosis not present

## 2022-03-22 LAB — BASIC METABOLIC PANEL
Anion gap: 12 (ref 5–15)
BUN: 20 mg/dL (ref 6–20)
CO2: 23 mmol/L (ref 22–32)
Calcium: 9 mg/dL (ref 8.9–10.3)
Chloride: 102 mmol/L (ref 98–111)
Creatinine, Ser: 5.04 mg/dL — ABNORMAL HIGH (ref 0.61–1.24)
GFR, Estimated: 13 mL/min — ABNORMAL LOW (ref >60)
Glucose, Bld: 157 mg/dL — ABNORMAL HIGH (ref 70–99)
Potassium: 5 mmol/L (ref 3.5–5.1)
Sodium: 137 mmol/L (ref 135–145)

## 2022-03-22 LAB — CBC
HCT: 36.7 % — ABNORMAL LOW (ref 39.0–52.0)
Hemoglobin: 11.1 g/dL — ABNORMAL LOW (ref 13.0–17.0)
MCH: 29.5 pg (ref 26.0–34.0)
MCHC: 30.2 g/dL (ref 30.0–36.0)
MCV: 97.6 fL (ref 80.0–100.0)
Platelets: 161 10*3/uL (ref 150–400)
RBC: 3.76 MIL/uL — ABNORMAL LOW (ref 4.22–5.81)
RDW: 14.6 % (ref 11.5–15.5)
WBC: 4.1 10*3/uL (ref 4.0–10.5)
nRBC: 0 % (ref 0.0–0.2)

## 2022-03-22 LAB — HEPATITIS B SURFACE ANTIGEN: Hepatitis B Surface Ag: NONREACTIVE

## 2022-03-22 MED ORDER — HEPARIN SODIUM (PORCINE) 1000 UNIT/ML DIALYSIS: 1000.0000 [IU] | INTRAMUSCULAR | Status: DC | PRN

## 2022-03-22 MED ORDER — MIDODRINE HCL 10 MG PO TABS: 10.0000 mg | ORAL_TABLET | Freq: Every day | ORAL | 0 refills | Status: AC | PRN

## 2022-03-22 MED ORDER — LIDOCAINE-PRILOCAINE 2.5-2.5 % EX CREA: 1.0000 | TOPICAL_CREAM | CUTANEOUS | Status: DC | PRN

## 2022-03-22 MED ORDER — PENTAFLUOROPROP-TETRAFLUOROETH EX AERO
INHALATION_SPRAY | CUTANEOUS | Status: AC
  Filled 2022-03-22: qty 30

## 2022-03-22 MED ORDER — ALTEPLASE 2 MG IJ SOLR: 2.0000 mg | Freq: Once | INTRAMUSCULAR | Status: DC | PRN

## 2022-03-22 MED ORDER — CHLORHEXIDINE GLUCONATE CLOTH 2 % EX PADS
6.0000 | MEDICATED_PAD | Freq: Every day | CUTANEOUS | Status: DC
  Filled 2022-03-22: qty 6

## 2022-03-22 MED ORDER — PENTAFLUOROPROP-TETRAFLUOROETH EX AERO: 1.0000 | INHALATION_SPRAY | CUTANEOUS | Status: DC | PRN

## 2022-03-22 MED ORDER — ANTICOAGULANT SODIUM CITRATE 4% (200MG/5ML) IV SOLN: 5.0000 mL | Status: DC | PRN

## 2022-03-22 MED ORDER — LIDOCAINE HCL (PF) 1 % IJ SOLN: 5.0000 mL | INTRAMUSCULAR | Status: DC | PRN

## 2022-03-22 NOTE — Progress Notes (Signed)
[facility]  ROUNDING NOTE   Subjective:  Patient well-known to [place] as we follow him for outpatient hemodialysis.  He dialyzes on MWF schedule at [facility].  Patient came in with significant shortness of breath.  He did not miss treatment per report.  Chest x-ray revealed interstitial opacities suspicious for pulmonary edema with possible small pleural effusions.  This a.m. he is still having some ongoing shortness of breath.  We have scheduled him for an extra dialysis treatment with volume removal target of 2 kg.   Objective:  Vital signs in last 24 hours:  Temp:  [97.9 F (36.6 C)-98.4 F (36.9 C)] 98.4 F (36.9 C) (01/27 1048) Pulse Rate:  [76-89] 82 (01/27 1003) Resp:  [13-22] 16 (01/27 1003) BP: (150-185)/(72-89) 154/84 (01/27 0800) SpO2:  [94 %-99 %] 98 % (01/27 1003) Weight:  [95.3 kg] 95.3 kg (01/26 1733)  Weight change:  Filed Weights   03/21/22 1733  Weight: 95.3 kg    Intake/Output: I/O last 3 completed shifts: In: -  Out: 200 [Urine:200]   Intake/Output this shift:  No intake/output data recorded.  Physical Exam: General: No acute distress  Head: Normocephalic, atraumatic. Moist oral mucosal membranes  Neck: Supple  Lungs:  Clear to auscultation, normal effort  Heart: S1S2 no rubs  Abdomen:  Soft, nontender, bowel sounds present  Extremities: Trace peripheral edema.  Neurologic: Awake, alert, following commands  Skin: No acute rash  Access: Right upper extremity AV fistula    Basic Metabolic Panel: Recent Labs  Lab 03/21/22 1740 03/22/22 0452  NA 137 137  K 3.8 5.0  CL 100 102  CO2 24 23  GLUCOSE 78 157*  BUN 13 20  CREATININE 4.25* 5.04*  CALCIUM 9.5 9.0    Liver Function Tests: Recent Labs  Lab 03/21/22 1740  AST 17  ALT 9  ALKPHOS 52  BILITOT 0.8  PROT 6.8  ALBUMIN 3.9   Recent Labs  Lab 03/21/22 1740  LIPASE 29   No results for input(s): "AMMONIA" in the last 168 hours.  CBC: Recent Labs  Lab  03/21/22 1740 03/22/22 0452  WBC 4.4 4.1  NEUTROABS 2.9  --   HGB 11.9* 11.1*  HCT 39.8 36.7*  MCV 98.5 97.6  PLT 164 161    Cardiac Enzymes: No results for input(s): "CKTOTAL", "CKMB", "CKMBINDEX", "TROPONINI" in the last 168 hours.  BNP: Invalid input(s): "POCBNP"  CBG: No results for input(s): "GLUCAP" in the last 168 hours.  Microbiology: Results for orders placed or performed during the hospital encounter of 03/21/22  Resp panel by RT-PCR (RSV, Flu A&B, Covid) Anterior Nasal Swab     Status: None   Collection Time: 03/21/22  5:40 PM   Specimen: Anterior Nasal Swab  Result Value Ref Range Status   SARS Coronavirus 2 by RT PCR NEGATIVE NEGATIVE Final    Comment: (NOTE) SARS-CoV-2 target nucleic acids are NOT DETECTED.  The SARS-CoV-2 RNA is generally detectable in upper respiratory specimens during the acute phase of infection. The lowest concentration of SARS-CoV-2 viral copies this assay can detect is 138 copies/mL. A negative result does not preclude SARS-Cov-2 infection and should not be used as the sole basis for treatment or other patient management decisions. A negative result may occur with  improper specimen collection/handling, submission of specimen other than nasopharyngeal swab, presence of viral mutation(s) within the areas targeted by this assay, and inadequate number of viral copies(<138 copies/mL). A negative result must be combined with clinical observations, patient history,  and epidemiological information. The expected result is Negative.  Fact Sheet for Patients:  EntrepreneurPulse.com.au  Fact Sheet for Healthcare Providers:  IncredibleEmployment.be  This test is no t yet approved or cleared by the [place] FDA and  has been authorized for detection and/or diagnosis of SARS-CoV-2 by FDA under an Emergency Use Authorization (EUA). This EUA will remain  in effect (meaning this test can be used) for the  duration of the COVID-19 declaration under Section 564(b)(1) of the Act, 21 U.S.C.section 360bbb-3(b)(1), unless the authorization is terminated  or revoked sooner.       Influenza A by PCR NEGATIVE NEGATIVE Final   Influenza B by PCR NEGATIVE NEGATIVE Final    Comment: (NOTE) The Xpert Xpress SARS-CoV-2/FLU/RSV plus assay is intended as an aid in the diagnosis of influenza from Nasopharyngeal swab specimens and should not be used as a sole basis for treatment. Nasal washings and aspirates are unacceptable for Xpert Xpress SARS-CoV-2/FLU/RSV testing.  Fact Sheet for Patients: EntrepreneurPulse.com.au  Fact Sheet for Healthcare Providers: IncredibleEmployment.be  This test is not yet approved or cleared by the [place] FDA and has been authorized for detection and/or diagnosis of SARS-CoV-2 by FDA under an Emergency Use Authorization (EUA). This EUA will remain in effect (meaning this test can be used) for the duration of the COVID-19 declaration under Section 564(b)(1) of the Act, 21 U.S.C. section 360bbb-3(b)(1), unless the authorization is terminated or revoked.     Resp Syncytial Virus by PCR NEGATIVE NEGATIVE Final    Comment: (NOTE) Fact Sheet for Patients: EntrepreneurPulse.com.au  Fact Sheet for Healthcare Providers: IncredibleEmployment.be  This test is not yet approved or cleared by the [place] FDA and has been authorized for detection and/or diagnosis of SARS-CoV-2 by FDA under an Emergency Use Authorization (EUA). This EUA will remain in effect (meaning this test can be used) for the duration of the COVID-19 declaration under Section 564(b)(1) of the Act, 21 U.S.C. section 360bbb-3(b)(1), unless the authorization is terminated or revoked.  Performed at [facility], [address] [place], [place] [address]     Coagulation Studies: No results for input(s):  "LABPROT", "INR" in the last 72 hours.  Urinalysis: Recent Labs    03/21/22 2127  COLORURINE YELLOW*  LABSPEC 1.005  PHURINE 9.0*  GLUCOSEU 150*  HGBUR LARGE*  BILIRUBINUR NEGATIVE  KETONESUR NEGATIVE  PROTEINUR 100*  NITRITE NEGATIVE  LEUKOCYTESUR NEGATIVE      Imaging: CT HEAD WO CONTRAST (5MM)  Result Date: 03/21/2022 CLINICAL DATA:  Shortness of breath, pulmonary edema EXAM: CT HEAD WITHOUT CONTRAST TECHNIQUE: Contiguous axial images were obtained from the base of the skull through the vertex without intravenous contrast. RADIATION DOSE REDUCTION: This exam was performed according to the departmental dose-optimization program which includes automated exposure control, adjustment of the mA and/or kV according to patient size and/or use of iterative reconstruction technique. COMPARISON:  None Available. FINDINGS: Brain: No evidence of acute infarction, hemorrhage, mass, mass effect, or midline shift. Suspect a small arachnoid cyst along the medial right frontal lobe. No hydrocephalus or acute extra-axial fluid collection. Vascular: No hyperdense vessel. Atherosclerotic calcifications in the intracranial carotid and vertebral arteries. Skull: Negative for fracture or focal lesion. Sinuses/Orbits: Mucosal thickening in the maxillary sinuses and left frontal sinus. No acute finding in the orbits. Other: The mastoid air cells are well aerated. IMPRESSION: No acute intracranial process. Electronically Signed   By: Merilyn Baba M.D.   On: 03/21/2022 19:40   DG Chest Portable 1 View  Result Date: 03/21/2022 CLINICAL DATA:  Respiratory distress. EXAM: PORTABLE CHEST 1 VIEW COMPARISON:  02/25/2022 FINDINGS: Similar cardiomegaly. Increase in interstitial opacities suspicious for pulmonary edema. Trace fluid in the right minor fissure. There may be small pleural effusions. No confluent airspace disease. No acute osseous abnormalities are seen. IMPRESSION: 1. Increased interstitial opacities  suspicious for pulmonary edema. Possible small pleural effusions. 2. Stable cardiomegaly. Electronically Signed   By: Keith Rake M.D.   On: 03/21/2022 17:51     Medications:    anticoagulant sodium citrate      acetaminophen  650 mg Oral Once   amLODipine  10 mg Oral Daily   aspirin EC  81 mg Oral Daily   calcium acetate  2,001 mg Oral TID WC   Chlorhexidine Gluconate Cloth  6 each Topical Q0600   clopidogrel  75 mg Oral Daily   furosemide  60 mg Intravenous Q12H   gabapentin  100 mg Oral QHS   heparin  5,000 Units Subcutaneous Q8H   lisinopril  40 mg Oral Daily   pantoprazole  40 mg Oral Daily   patiromer  8.4 g Oral Daily   simvastatin  10 mg Oral q1800   acetaminophen **OR** acetaminophen, alteplase, anticoagulant sodium citrate, calcium acetate, heparin, HYDROcodone-acetaminophen, lidocaine (PF), lidocaine-prilocaine, magnesium hydroxide, ondansetron **OR** ondansetron (ZOFRAN) IV, pentafluoroprop-tetrafluoroeth, traZODone  Assessment/ Plan:  52 y.o. male with past medical history of anxiety, asthma, ESRD on HD MWF, DVT, tobacco abuse, GERD, anemia of chronic kidney disease, secondary hyperparathyroidism, hypertension, peripheral vascular disease, GERD who presented with shortness of breath due to pulmonary edema.  [facility]/MWF/[facility]/LUE AVF  1.  ESRD on HD on MWF schedule.  Patient reports that he has not missed any dialysis treatment this week.  However chest x-ray shows findings consistent with pulmonary edema.  As such we will plan for next dialysis treatment today with ultrafiltration target of 2 kg.  Reassess for further need tomorrow.  2.  Acute respiratory failure.  Currently on nonrebreather mask.  Has pulmonary edema on exam.  We are planning for hemodialysis treatment as above.  3.  Anemia of chronic kidney disease.  Hemoglobin currently at target 11.1.  Winfield Rast as an outpatient.  Hold off on Mircera as an outpatient.  4.  Secondary  hyperparathyroidism.  Monitor bone metabolism parameters over the course of the hospitalization.   LOS: 1 Kendricks Reap 1/27/202410:57 AM

## 2022-03-22 NOTE — Care Management CC44 (Signed)
Condition Code 44 Documentation Completed  Patient Details  Name: Oscar Moody. MRN: [id number] Date of Birth: 1970-03-18   Condition Code 44 given:  Yes Patient signature on Condition Code 44 notice:  Yes Documentation of 2 MD's agreement:  Yes Code 44 added to claim:  Yes    Florice Hindle E Analei Whinery, LCSW 03/22/2022, 4:39 PM

## 2022-03-22 NOTE — Discharge Summary (Signed)
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 section 360bbb-3(b)(1), unless the authorization is terminated or revoked.  Performed at [facility], [address] [place], [place] [address]      Labs: BNP (last 3 results) Recent Labs    02/25/22 0207 03/21/22 1815  BNP 1,219.5* 876.8*   Basic Metabolic Panel: Recent Labs  Lab 03/21/22 1740 03/22/22 0452  NA 137 137  K 3.8 5.0  CL 100 102  CO2  24 23  GLUCOSE 78 157*  BUN 13 20  CREATININE 4.25* 5.04*  CALCIUM 9.5 9.0   Liver Function Tests: Recent Labs  Lab 03/21/22 1740  AST 17  ALT 9  ALKPHOS 52  BILITOT 0.8  PROT 6.8  ALBUMIN 3.9   Recent Labs  Lab 03/21/22 1740  LIPASE 29   No results for  input(s): "AMMONIA" in the last 168 hours. CBC: Recent Labs  Lab 03/21/22 1740 03/22/22 0452  WBC 4.4 4.1  NEUTROABS 2.9  --   HGB 11.9* 11.1*  HCT 39.8 36.7*  MCV 98.5 97.6  PLT 164 161   Cardiac Enzymes: No results for input(s): "CKTOTAL", "CKMB", "CKMBINDEX", "TROPONINI" in the last 168 hours. BNP: Invalid input(s): "POCBNP" CBG: No results for input(s): "GLUCAP" in the last 168 hours. D-Dimer No results for input(s): "DDIMER" in the last 72 hours. Hgb A1c No results for input(s): "HGBA1C" in the last 72 hours. Lipid Profile No results for input(s): "CHOL", "HDL", "LDLCALC", "TRIG", "CHOLHDL", "LDLDIRECT" in the last 72 hours. Thyroid function studies No results for input(s): "TSH", "T4TOTAL", "T3FREE", "THYROIDAB" in the last 72 hours.  Invalid input(s): "FREET3" Anemia work up No results for input(s): "VITAMINB12", "FOLATE", "FERRITIN", "TIBC", "IRON", "RETICCTPCT" in the last 72 hours. Urinalysis    Component Value Date/Time   COLORURINE YELLOW (A) 03/21/2022 2127   APPEARANCEUR CLEAR (A) 03/21/2022 2127   APPEARANCEUR Clear 02/28/2021 1103   LABSPEC 1.005 03/21/2022 2127   PHURINE 9.0 (H) 03/21/2022 2127   GLUCOSEU 150 (A) 03/21/2022 2127   HGBUR LARGE (A) 03/21/2022 2127   BILIRUBINUR NEGATIVE 03/21/2022 2127   BILIRUBINUR Negative 02/28/2021 [place] NEGATIVE 03/21/2022 2127   PROTEINUR 100 (A) 03/21/2022 2127   NITRITE NEGATIVE 03/21/2022 2127   LEUKOCYTESUR NEGATIVE 03/21/2022 2127   Sepsis Labs Recent Labs  Lab 03/21/22 1740 03/22/22 0452  WBC 4.4 4.1   Microbiology Recent Results (from the past 240 hour(s))  Resp panel by RT-PCR (RSV, Flu A&B, Covid) Anterior Nasal Swab     Status: None   Collection Time: 03/21/22  5:40 PM   Specimen: Anterior Nasal Swab  Result Value Ref Range Status   SARS Coronavirus 2 by RT PCR NEGATIVE NEGATIVE Final    Comment: (NOTE) SARS-CoV-2 target nucleic acids are NOT DETECTED.  The SARS-CoV-2 RNA is  generally detectable in upper respiratory specimens during the acute phase of infection. The lowest concentration of SARS-CoV-2 viral copies this assay can detect is 138 copies/mL. A negative result does not preclude SARS-Cov-2 infection and should not be used as the sole basis for treatment or other patient management decisions. A negative result may occur with  improper specimen collection/handling, submission of specimen other than nasopharyngeal swab, presence of viral mutation(s) within the areas targeted by this assay, and inadequate number of viral copies(<138 copies/mL). A negative result must be combined with clinical observations, patient history, and epidemiological information. The expected result is Negative.  Fact Sheet for Patients:  EntrepreneurPulse.com.au  Fact Sheet for Healthcare Providers:  IncredibleEmployment.be  This test is no t yet approved or cleared by the [place] FDA and  has been authorized for detection and/or diagnosis of SARS-CoV-2 by FDA under an Emergency Use Authorization (EUA). This EUA will remain  in effect (meaning this test can be used) for the duration of the COVID-19 declaration under Section 564(b)(1) of the Act, 21 U.S.C.section 360bbb-3(b)(1), unless the authorization is terminated  or revoked sooner.       Influenza A by  PCR NEGATIVE NEGATIVE Final   Influenza B by PCR NEGATIVE NEGATIVE Final    Comment: (NOTE) The Xpert Xpress SARS-CoV-2/FLU/RSV plus assay is intended as an aid in the diagnosis of influenza from Nasopharyngeal swab specimens and should not be used as a sole basis for treatment. Nasal washings and aspirates are unacceptable for Xpert Xpress SARS-CoV-2/FLU/RSV testing.  Fact Sheet for Patients: EntrepreneurPulse.com.au  Fact Sheet for Healthcare Providers: IncredibleEmployment.be  This test is not yet approved or cleared by the [place] FDA and has been authorized for detection and/or diagnosis of SARS-CoV-2 by FDA under an Emergency Use Authorization (EUA). This EUA will remain in effect (meaning this test can be used) for the duration of the COVID-19 declaration under Section 564(b)(1) of the Act, 21 U.S.C. section 360bbb-3(b)(1), unless the authorization is terminated or revoked.     Resp Syncytial Virus by PCR NEGATIVE NEGATIVE Final    Comment: (NOTE) Fact Sheet for Patients: EntrepreneurPulse.com.au  Fact Sheet for Healthcare Providers: IncredibleEmployment.be  This test is not yet approved or cleared by the [place] FDA and has been authorized for detection and/or diagnosis of SARS-CoV-2 by FDA under an Emergency Use Authorization (EUA). This EUA will remain in effect (meaning this test can be used) for the duration of the COVID-19 declaration under Section 564(b)(1) of the Act, 21 U.S.C. section 360bbb-3(b)(1), unless the authorization is terminated or revoked.  Performed at [facility], [address], [place], [place] [address]      Time coordinating discharge: Over 30 minutes  SIGNED:   Sidney Ace, MD  [facility] 03/22/2022, 4:13 PM Pager   If 7PM-7AM, please contact night-coverage

## 2022-03-22 NOTE — ED Notes (Signed)
Patient received back on floor from dialysis.

## 2022-03-22 NOTE — Progress Notes (Signed)
PROGRESS NOTE    Oscar Moody.  RJJ:[id number] DOB: Jul 10, 1970 DOA: 03/21/2022 PCP: Danae Orleans, MD    Brief Narrative:  52 y.o. Caucasian male with medical history significant for end-stage renal disease on hemodialysis on [address], asthma, DVT, GERD, dyslipidemia and hypertension as well as peripheral vascular disease, who presented to the emergency room with a [name] of worsening dyspnea with associated dry cough and wheezing.  He has been having orthopnea and paroxysmal nocturnal dyspnea with associated lower extremity edema more on the right than the left that is chronic.  He denies any nausea or vomiting or abdominal pain.  Denies any chest pain or palpitations.  He has been having chills.  He admits to headache without dizziness or blurred vision.  He is having low back pain.  His blood pressure dropped during hemodialysis session today.   Nephrology consulted.  Plan for HD 1/27   Assessment & Plan:   Principal Problem:   Acute pulmonary edema (HCC) Active Problems:   ESRD on hemodialysis ([place])   Dyslipidemia   Essential hypertension   GERD without esophagitis   Coronary artery disease   Depression   Acute on chronic systolic CHF (congestive heart failure) (HCC)  Acute pulmonary edema (HCC) Suspect secondary to fluid overloaded state and acute on chronic systolic congestive heart failure with associated hypoxic respiratory failure.  Given 120 milligrams IV Lasix in ED.  Nephrology consulted. Patient is concerned that his fistula might not be functioning properly Plan: HD today with attempted 2 L fluid removal If unable to ultrafiltrate consider fistulogram and vascular surgery evaluation   ESRD on hemodialysis [facility]) Nephrology on consult, HD planned 1/27    Dyslipidemia PTA statin   Essential hypertension BP controlled.  Continue current treatment   Depression PTA Lexapro   Coronary artery disease DAPT aspirin and Plavix High intensity statin    GERD without esophagitis PTA PPI  DVT prophylaxis: SQ heparin Code Status: Full Family Communication: None today Disposition Plan: Status is: Inpatient Remains inpatient appropriate because: Fluid overload state secondary to inability to ultrafiltrate during HD   Level of care: Telemetry Cardiac  Consultants:  Nephrology  Procedures:  None Antimicrobials: None  Subjective: Seen and examined.  Resting in bed.  Continues to endorse shortness of breath.  Objective: Vitals:   03/22/22 0800 03/22/22 1003 03/22/22 1048 03/22/22 1059  BP: (!) 154/84  (!) 150/78   Pulse: 79 82 79   Resp: 20 16 20    Temp:   98.4 F (36.9 C)   TempSrc:   Oral   SpO2: 99% 98% 97%   Weight:    95.6 kg  Height:        Intake/Output Summary (Last 24 hours) at 03/22/2022 1106 Last data filed at 03/22/2022 0651 Gross per 24 hour  Intake --  Output 200 ml  Net -200 ml   Filed Weights   03/21/22 1733 03/22/22 1059  Weight: 95.3 kg 95.6 kg    Examination:  General exam: Appears calm and comfortable  Respiratory system: Scattered crackles bilaterally.  Normal work of breathing.  2 L via aerosol mask Cardiovascular system: S1-S2, RRR, no murmurs, no pedal edema Gastrointestinal system: Soft, NT/ND, normal bowel sounds Central nervous system: Alert and oriented. No focal neurological deficits. Extremities: Right upper extremity AV fistula with palpable thrill Skin: No rashes, lesions or ulcers Psychiatry: Judgement and insight appear normal. Mood & affect appropriate.     Data Reviewed: I have personally reviewed following labs and imaging studies  CBC: Recent Labs  Lab 03/21/22 1740 03/22/22 0452  WBC 4.4 4.1  NEUTROABS 2.9  --   HGB 11.9* 11.1*  HCT 39.8 36.7*  MCV 98.5 97.6  PLT 164 269   Basic Metabolic Panel: Recent Labs  Lab 03/21/22 1740 03/22/22 0452  NA 137 137  K 3.8 5.0  CL 100 102  CO2 24 23  GLUCOSE 78 157*  BUN 13 20  CREATININE 4.25* 5.04*  CALCIUM 9.5  9.0   GFR: Estimated Creatinine Clearance: 20.3 mL/min (A) (by C-G formula based on SCr of 5.04 mg/dL (H)). Liver Function Tests: Recent Labs  Lab 03/21/22 1740  AST 17  ALT 9  ALKPHOS 52  BILITOT 0.8  PROT 6.8  ALBUMIN 3.9   Recent Labs  Lab 03/21/22 1740  LIPASE 29   No results for input(s): "AMMONIA" in the last 168 hours. Coagulation Profile: No results for input(s): "INR", "PROTIME" in the last 168 hours. Cardiac Enzymes: No results for input(s): "CKTOTAL", "CKMB", "CKMBINDEX", "TROPONINI" in the last 168 hours. BNP (last 3 results) No results for input(s): "PROBNP" in the last 8760 hours. HbA1C: No results for input(s): "HGBA1C" in the last 72 hours. CBG: No results for input(s): "GLUCAP" in the last 168 hours. Lipid Profile: No results for input(s): "CHOL", "HDL", "LDLCALC", "TRIG", "CHOLHDL", "LDLDIRECT" in the last 72 hours. Thyroid Function Tests: No results for input(s): "TSH", "T4TOTAL", "FREET4", "T3FREE", "THYROIDAB" in the last 72 hours. Anemia Panel: No results for input(s): "VITAMINB12", "FOLATE", "FERRITIN", "TIBC", "IRON", "RETICCTPCT" in the last 72 hours. Sepsis Labs: Recent Labs  Lab 03/21/22 1738 03/21/22 1925  LATICACIDVEN 1.1 1.2    Recent Results (from the past 240 hour(s))  Resp panel by RT-PCR (RSV, Flu A&B, Covid) Anterior Nasal Swab     Status: None   Collection Time: 03/21/22  5:40 PM   Specimen: Anterior Nasal Swab  Result Value Ref Range Status   SARS Coronavirus 2 by RT PCR NEGATIVE NEGATIVE Final    Comment: (NOTE) SARS-CoV-2 target nucleic acids are NOT DETECTED.  The SARS-CoV-2 RNA is generally detectable in upper respiratory specimens during the acute phase of infection. The lowest concentration of SARS-CoV-2 viral copies this assay can detect is 138 copies/mL. A negative result does not preclude SARS-Cov-2 infection and should not be used as the sole basis for treatment or other patient management decisions. A negative  result may occur with  improper specimen collection/handling, submission of specimen other than nasopharyngeal swab, presence of viral mutation(s) within the areas targeted by this assay, and inadequate number of viral copies(<138 copies/mL). A negative result must be combined with clinical observations, patient history, and epidemiological information. The expected result is Negative.  Fact Sheet for Patients:  EntrepreneurPulse.com.au  Fact Sheet for Healthcare Providers:  IncredibleEmployment.be  This test is no t yet approved or cleared by the [place] FDA and  has been authorized for detection and/or diagnosis of SARS-CoV-2 by FDA under an Emergency Use Authorization (EUA). This EUA will remain  in effect (meaning this test can be used) for the duration of the COVID-19 declaration under Section 564(b)(1) of the Act, 21 U.S.C.section 360bbb-3(b)(1), unless the authorization is terminated  or revoked sooner.       Influenza A by PCR NEGATIVE NEGATIVE Final   Influenza B by PCR NEGATIVE NEGATIVE Final    Comment: (NOTE) The Xpert Xpress SARS-CoV-2/FLU/RSV plus assay is intended as an aid in the diagnosis of influenza from Nasopharyngeal swab specimens and should not be used as  a sole basis for treatment. Nasal washings and aspirates are unacceptable for Xpert Xpress SARS-CoV-2/FLU/RSV testing.  Fact Sheet for Patients: EntrepreneurPulse.com.au  Fact Sheet for Healthcare Providers: IncredibleEmployment.be  This test is not yet approved or cleared by the [place] FDA and has been authorized for detection and/or diagnosis of SARS-CoV-2 by FDA under an Emergency Use Authorization (EUA). This EUA will remain in effect (meaning this test can be used) for the duration of the COVID-19 declaration under Section 564(b)(1) of the Act, 21 U.S.C. section 360bbb-3(b)(1), unless the authorization is  terminated or revoked.     Resp Syncytial Virus by PCR NEGATIVE NEGATIVE Final    Comment: (NOTE) Fact Sheet for Patients: EntrepreneurPulse.com.au  Fact Sheet for Healthcare Providers: IncredibleEmployment.be  This test is not yet approved or cleared by the [place] FDA and has been authorized for detection and/or diagnosis of SARS-CoV-2 by FDA under an Emergency Use Authorization (EUA). This EUA will remain in effect (meaning this test can be used) for the duration of the COVID-19 declaration under Section 564(b)(1) of the Act, 21 U.S.C. section 360bbb-3(b)(1), unless the authorization is terminated or revoked.  Performed at [facility], [address], [place], [place] [address]          Radiology Studies: CT HEAD WO CONTRAST (5MM)  Result Date: 03/21/2022 CLINICAL DATA:  Shortness of breath, pulmonary edema EXAM: CT HEAD WITHOUT CONTRAST TECHNIQUE: Contiguous axial images were obtained from the base of the skull through the vertex without intravenous contrast. RADIATION DOSE REDUCTION: This exam was performed according to the departmental dose-optimization program which includes automated exposure control, adjustment of the mA and/or kV according to patient size and/or use of iterative reconstruction technique. COMPARISON:  None Available. FINDINGS: Brain: No evidence of acute infarction, hemorrhage, mass, mass effect, or midline shift. Suspect a small arachnoid cyst along the medial right frontal lobe. No hydrocephalus or acute extra-axial fluid collection. Vascular: No hyperdense vessel. Atherosclerotic calcifications in the intracranial carotid and vertebral arteries. Skull: Negative for fracture or focal lesion. Sinuses/Orbits: Mucosal thickening in the maxillary sinuses and left frontal sinus. No acute finding in the orbits. Other: The mastoid air cells are well aerated. IMPRESSION: No acute intracranial process.  Electronically Signed   By: Merilyn Baba M.D.   On: 03/21/2022 19:40   DG Chest Portable 1 View  Result Date: 03/21/2022 CLINICAL DATA:  Respiratory distress. EXAM: PORTABLE CHEST 1 VIEW COMPARISON:  02/25/2022 FINDINGS: Similar cardiomegaly. Increase in interstitial opacities suspicious for pulmonary edema. Trace fluid in the right minor fissure. There may be small pleural effusions. No confluent airspace disease. No acute osseous abnormalities are seen. IMPRESSION: 1. Increased interstitial opacities suspicious for pulmonary edema. Possible small pleural effusions. 2. Stable cardiomegaly. Electronically Signed   By: Keith Rake M.D.   On: 03/21/2022 17:51        Scheduled Meds:  pentafluoroprop-tetrafluoroeth       acetaminophen  650 mg Oral Once   amLODipine  10 mg Oral Daily   aspirin EC  81 mg Oral Daily   calcium acetate  2,001 mg Oral TID WC   Chlorhexidine Gluconate Cloth  6 each Topical Q0600   clopidogrel  75 mg Oral Daily   furosemide  60 mg Intravenous Q12H   gabapentin  100 mg Oral QHS   heparin  5,000 Units Subcutaneous Q8H   lisinopril  40 mg Oral Daily   pantoprazole  40 mg Oral Daily   patiromer  8.4 g Oral Daily  simvastatin  10 mg Oral q1800   Continuous Infusions:  anticoagulant sodium citrate       LOS: 1 day      Sidney Ace, MD [facility]   If 7PM-7AM, please contact night-coverage  03/22/2022, 11:06 AM

## 2022-03-22 NOTE — Progress Notes (Signed)
Pt completed 3.5 hour HD treatment w/ no complications. Alert, no c/o, stable, report to ED RN.  Start: 1110 End: 1445 3047ml fluid removed 74.8L BVP 90.8kg post standing weight

## 2022-03-23 LAB — HEPATITIS B SURFACE ANTIBODY, QUANTITATIVE: Hep B S AB Quant (Post): 10.2 m[IU]/mL (ref >9.9)

## 2022-04-01 ENCOUNTER — Ambulatory Visit: Payer: Medicare Other | Admitting: Urology

## 2022-04-01 ENCOUNTER — Other Ambulatory Visit (INDEPENDENT_AMBULATORY_CARE_PROVIDER_SITE_OTHER): Payer: Self-pay | Admitting: Vascular Surgery

## 2022-04-01 DIAGNOSIS — N186 End stage renal disease: Secondary | ICD-10-CM

## 2022-04-01 NOTE — Progress Notes (Signed)
MRN : [id number]  Oscar Moody. is a 52 y.o. (1971/01/25) male who presents with chief complaint of check access.  History of Present Illness:   The patient returns to the office for follow up regarding a problem with their dialysis access.  The patient had a revision of his right radiocephalic AV fistula to a brachiocephalic in [id number] by [facility]  The patient notes a significant increase in bleeding time after decannulation.  The patient has also been informed that there is increased recirculation.    The patient denies hand pain or other symptoms consistent with steal phenomena.  No significant arm swelling.  No pain in his upper extremity.  The patient denies redness or swelling at the access site. The patient denies fever or chills at home or while on dialysis.  No recent shortening of the patient's walking distance or new symptoms consistent with claudication.  No history of rest pain symptoms. No new ulcers or wounds of the lower extremities have occurred.  The patient denies amaurosis fugax or recent TIA symptoms. There are no recent neurological changes noted. There is no history of DVT, PE or superficial thrombophlebitis. No recent episodes of angina or shortness of breath documented.   Duplex ultrasound of the AV access shows a patent access.  The previously noted stenosis is significantly increased compared to last study.  Flow volume today is 2536 cc/min   No outpatient medications have been marked as taking for the 04/03/22 encounter (Appointment) with Delana Meyer, Dolores Lory, MD.    Past Medical History:  Diagnosis Date   Anxiety    Arthritis    Asthma    childhood asthma   Chronic kidney disease    Current every day smoker    DVT (deep venous thrombosis) ([place])    Dyspnea    with exertion   GERD (gastroesophageal reflux disease)    Headache    Hypercholesteremia    Hypertension    Peripheral vascular disease ([place])     Past Surgical History:   Procedure Laterality Date   AV FISTULA PLACEMENT Right 12/26/2015   Procedure: ARTERIOVENOUS (AV) FISTULA CREATION ( RADIAL CEPHALIC );  Surgeon: Katha Cabal, MD;  Location: [facility] ORS;  Service: Vascular;  Laterality: Right;   FRACTURE SURGERY Left    screw in left wrist   LOWER EXTREMITY ANGIOGRAPHY Right 07/30/2021   Procedure: Lower Extremity Angiography;  Surgeon: Katha Cabal, MD;  Location: [place] [facility];  Service: Cardiovascular;  Laterality: Right;   PERIPHERAL VASCULAR CATHETERIZATION Right 03/28/2016   Procedure: A/V Fistulagram;  Surgeon: Katha Cabal, MD;  Location: [place] [facility];  Service: Cardiovascular;  Laterality: Right;   PERIPHERAL VASCULAR CATHETERIZATION N/A 03/28/2016   Procedure: A/V Shunt Intervention;  Surgeon: Katha Cabal, MD;  Location: [place] [facility];  Service: Cardiovascular;  Laterality: N/A;   VASCULAR SURGERY Left 07/2015   stent in left thigh, Location:[place]    Social History Social History   Tobacco Use   Smoking status: Every Day    Packs/day: 0.25    Types: Cigarettes   Smokeless tobacco: Never  Vaping Use   Vaping Use: Never used  Substance Use Topics   Alcohol use: No   Drug use: Yes    Types: Marijuana    Comment: occassional (when out of pain medication)    Family History Family History  Problem  Relation Age of Onset   Hypertension Mother    Polycystic kidney disease Mother    Hypertension Sister     Allergies  Allergen Reactions   Iodinated Contrast Media Other (See Comments)    hypotension   Oxycodone-Acetaminophen Itching     REVIEW OF SYSTEMS (Negative unless checked)  Constitutional: '[]'$ Weight loss  '[]'$ Fever  '[]'$ Chills Cardiac: '[]'$ Chest pain   '[]'$ Chest pressure   '[]'$ Palpitations   '[]'$ Shortness of breath when laying flat   '[]'$ Shortness of breath with exertion. Vascular:  '[]'$ Pain in legs with walking   '[]'$ Pain in legs at rest  '[]'$ History of DVT   '[]'$ Phlebitis   '[]'$ Swelling in legs   '[]'$ Varicose  veins   '[]'$ Non-healing ulcers Pulmonary:   '[]'$ Uses home oxygen   '[]'$ Productive cough   '[]'$ Hemoptysis   '[]'$ Wheeze  '[]'$ COPD   '[]'$ Asthma Neurologic:  '[]'$ Dizziness   '[]'$ Seizures   '[]'$ History of stroke   '[]'$ History of TIA  '[]'$ Aphasia   '[]'$ Vissual changes   '[]'$ Weakness or numbness in arm   '[]'$ Weakness or numbness in leg Musculoskeletal:   '[]'$ Joint swelling   '[]'$ Joint pain   '[]'$ Low back pain Hematologic:  '[]'$ Easy bruising  '[]'$ Easy bleeding   '[]'$ Hypercoagulable state   '[]'$ Anemic Gastrointestinal:  '[]'$ Diarrhea   '[]'$ Vomiting  '[]'$ Gastroesophageal reflux/heartburn   '[]'$ Difficulty swallowing. Genitourinary:  '[x]'$ Chronic kidney disease   '[]'$ Difficult urination  '[]'$ Frequent urination   '[]'$ Blood in urine Skin:  '[]'$ Rashes   '[]'$ Ulcers  Psychological:  '[]'$ History of anxiety   '[]'$  History of major depression.  Physical Examination  There were no vitals filed for this visit. There is no height or weight on file to calculate BMI. Gen: WD/WN, NAD Head: [place]/AT, No temporalis wasting.  Ear/Nose/Throat: Hearing grossly intact, nares w/o erythema or drainage Eyes: PER, EOMI, sclera nonicteric.  Neck: Supple, no gross masses or lesions.  No JVD.  Pulmonary:  Good air movement, no audible wheezing, no use of accessory muscles.  Cardiac: RRR, precordium non-hyperdynamic. Vascular:   Palpable thrill and bruit in the right upper extremity Vessel Right Left  Radial Palpable Palpable  Brachial Palpable Palpable  Gastrointestinal: soft, non-distended. No guarding/no peritoneal signs.  Musculoskeletal: M/S 5/5 throughout.  No deformity.  Neurologic: CN 2-12 intact. Pain and light touch intact in extremities.  Symmetrical.  Speech is fluent. Motor exam as listed above. Psychiatric: Judgment intact, Mood & affect appropriate for pt's clinical situation. Dermatologic: No rashes or ulcers noted.  No changes consistent with cellulitis.   CBC Lab Results  Component Value Date   WBC 4.1 03/22/2022   HGB 11.1 (L) 03/22/2022   HCT 36.7 (L) 03/22/2022   MCV  97.6 03/22/2022   PLT 161 03/22/2022    BMET    Component Value Date/Time   NA 137 03/22/2022 0452   K 5.0 03/22/2022 0452   CL 102 03/22/2022 0452   CO2 23 03/22/2022 0452   GLUCOSE 157 (H) 03/22/2022 0452   BUN 20 03/22/2022 0452   CREATININE 5.04 (H) 03/22/2022 0452   CALCIUM 9.0 03/22/2022 0452   GFRNONAA 13 (L) 03/22/2022 0452   GFRAA 12 (L) 03/28/2016 0941   Estimated Creatinine Clearance: 18.6 mL/min (A) (by C-G formula based on SCr of 5.04 mg/dL (H)).  COAG Lab Results  Component Value Date   INR 1.06 12/11/2015    Radiology CT HEAD WO CONTRAST (5MM)  Result Date: 03/21/2022 CLINICAL DATA:  Shortness of breath, pulmonary edema EXAM: CT HEAD WITHOUT CONTRAST TECHNIQUE: Contiguous axial images were obtained from the base of the skull through the vertex  without intravenous contrast. RADIATION DOSE REDUCTION: This exam was performed according to the departmental dose-optimization program which includes automated exposure control, adjustment of the mA and/or kV according to patient size and/or use of iterative reconstruction technique. COMPARISON:  None Available. FINDINGS: Brain: No evidence of acute infarction, hemorrhage, mass, mass effect, or midline shift. Suspect a small arachnoid cyst along the medial right frontal lobe. No hydrocephalus or acute extra-axial fluid collection. Vascular: No hyperdense vessel. Atherosclerotic calcifications in the intracranial carotid and vertebral arteries. Skull: Negative for fracture or focal lesion. Sinuses/Orbits: Mucosal thickening in the maxillary sinuses and left frontal sinus. No acute finding in the orbits. Other: The mastoid air cells are well aerated. IMPRESSION: No acute intracranial process. Electronically Signed   By: Merilyn Baba M.D.   On: 03/21/2022 19:40   DG Chest Portable 1 View  Result Date: 03/21/2022 CLINICAL DATA:  Respiratory distress. EXAM: PORTABLE CHEST 1 VIEW COMPARISON:  02/25/2022 FINDINGS: Similar  cardiomegaly. Increase in interstitial opacities suspicious for pulmonary edema. Trace fluid in the right minor fissure. There may be small pleural effusions. No confluent airspace disease. No acute osseous abnormalities are seen. IMPRESSION: 1. Increased interstitial opacities suspicious for pulmonary edema. Possible small pleural effusions. 2. Stable cardiomegaly. Electronically Signed   By: Keith Rake M.D.   On: 03/21/2022 17:51     Assessment/Plan 1. ESRD on hemodialysis [facility]) Recommend:  The patient is experiencing increasing problems with their dialysis access.  Patient should have a fistulagram with the intention for intervention.  The intention for intervention is to restore appropriate flow and prevent thrombosis and possible loss of the access.  As well as improve the quality of dialysis therapy.  The risks, benefits and alternative therapies were reviewed in detail with the patient.  All questions were answered.  The patient agrees to proceed with angio/intervention.    The patient will follow up with me in the office after the procedure.  - VAS US DUPLEX DIALYSIS ACCESS (AVF, AVG)  2. Essential hypertension Continue antihypertensive medications as already ordered, these medications have been reviewed and there are no changes at this time.  3. Atherosclerosis of native artery of right lower extremity with intermittent claudication [facility]) Patient had previous studies which showed stable peripheral arterial disease.  No intervention recommended at this time.  Patient will continue with follow-up as prescribed.    Hortencia Pilar, MD  04/01/2022 9:01 PM

## 2022-04-02 ENCOUNTER — Ambulatory Visit: Payer: Medicare Other | Admitting: Urology

## 2022-04-03 ENCOUNTER — Ambulatory Visit: Payer: Medicare Other | Admitting: Urology

## 2022-04-03 ENCOUNTER — Ambulatory Visit (INDEPENDENT_AMBULATORY_CARE_PROVIDER_SITE_OTHER): Payer: 59

## 2022-04-03 ENCOUNTER — Encounter (INDEPENDENT_AMBULATORY_CARE_PROVIDER_SITE_OTHER): Payer: Self-pay | Admitting: Nurse Practitioner

## 2022-04-03 ENCOUNTER — Ambulatory Visit (INDEPENDENT_AMBULATORY_CARE_PROVIDER_SITE_OTHER): Payer: 59 | Admitting: Nurse Practitioner

## 2022-04-03 VITALS — BP 136/80 | HR 80 | Resp 16 | Wt 199.8 lb

## 2022-04-03 DIAGNOSIS — N186 End stage renal disease: Secondary | ICD-10-CM

## 2022-04-03 DIAGNOSIS — I70211 Atherosclerosis of native arteries of extremities with intermittent claudication, right leg: Secondary | ICD-10-CM

## 2022-04-03 DIAGNOSIS — Z992 Dependence on renal dialysis: Secondary | ICD-10-CM

## 2022-04-03 DIAGNOSIS — J45909 Unspecified asthma, uncomplicated: Secondary | ICD-10-CM

## 2022-04-03 DIAGNOSIS — I25119 Atherosclerotic heart disease of native coronary artery with unspecified angina pectoris: Secondary | ICD-10-CM

## 2022-04-03 DIAGNOSIS — I10 Essential (primary) hypertension: Secondary | ICD-10-CM | POA: Diagnosis not present

## 2022-04-08 ENCOUNTER — Ambulatory Visit: Payer: Medicare Other | Admitting: Urology

## 2022-04-20 ENCOUNTER — Encounter (INDEPENDENT_AMBULATORY_CARE_PROVIDER_SITE_OTHER): Payer: Self-pay | Admitting: Nurse Practitioner

## 2022-04-28 ENCOUNTER — Telehealth: Payer: Self-pay

## 2022-04-28 NOTE — Progress Notes (Unsigned)
04/29/2022 9:45 AM   Oscar Moody. 1970-06-12 [id number]  Referring provider: Danae Orleans, MD No address on file  Urological history: 1.  Peyronie's disease -Contributing factors of a possible penile injury during intercourse several years ago, age, smoking and peripheral vascular disease -Had his first cycle of Xiaflex last year -Still reporting a 45 degree curvature at this point  2.  Erectile dysfunction -Contributing factors of age, Peyronie's disease, anxiety, asthma, ESRD, hypercholesterolemia, smoking (cigarettes and marijuana), peripheral vascular disease, coronary artery disease, congestive heart failure and COPD -SHIM 24   Chief Complaint  Patient presents with   Other    HPI: Oscar Moody. is a 52 y.o. male who presents today for further discussion regarding his Peyronie's disease.    SHIM 24  Patient still having spontaneous erections.  He denies any pain with erections.  He is now experiencing a ventral curvature that deviates to the left.  He does have his second cycle of Xiaflex medication here with [place] in our medical refrigerator.     [place] Name 04/29/22 [phone]         SHIM: Over the last 6 months:   How do you rate your confidence that you could get and keep an erection? Very High     When you had erections with sexual stimulation, how often were your erections hard enough for penetration (entering your partner)? Most Times (much more than half the time)     During sexual intercourse, how often were you able to maintain your erection after you had penetrated (entered) your partner? Almost Always or Always     During sexual intercourse, how difficult was it to maintain your erection to completion of intercourse? Not Difficult     When you attempted sexual intercourse, how often was it satisfactory for you? Almost Always or Always       SHIM Total Score   SHIM 24              Score: 1-7 Severe ED 8-11 Moderate ED 12-16  Mild-Moderate ED 17-21 Mild ED 22-25 No ED    PMH: Past Medical History:  Diagnosis Date   Anxiety    Arthritis    Asthma    childhood asthma   Chronic kidney disease    Current every day smoker    DVT (deep venous thrombosis) ([facility])    Dyspnea    with exertion   GERD (gastroesophageal reflux disease)    Headache    Hypercholesteremia    Hypertension    Peripheral vascular disease ([place])     Surgical History: Past Surgical History:  Procedure Laterality Date   AV FISTULA PLACEMENT Right 12/26/2015   Procedure: ARTERIOVENOUS (AV) FISTULA CREATION ( RADIAL CEPHALIC );  Surgeon: Katha Cabal, MD;  Location: [facility] ORS;  Service: Vascular;  Laterality: Right;   FRACTURE SURGERY Left    screw in left wrist   LOWER EXTREMITY ANGIOGRAPHY Right 07/30/2021   Procedure: Lower Extremity Angiography;  Surgeon: Katha Cabal, MD;  Location: [place] [facility];  Service: Cardiovascular;  Laterality: Right;   PERIPHERAL VASCULAR CATHETERIZATION Right 03/28/2016   Procedure: A/V Fistulagram;  Surgeon: Katha Cabal, MD;  Location: [place] [facility];  Service: Cardiovascular;  Laterality: Right;   PERIPHERAL VASCULAR CATHETERIZATION N/A 03/28/2016   Procedure: A/V Shunt Intervention;  Surgeon: Katha Cabal, MD;  Location: [place] [facility];  Service: Cardiovascular;  Laterality: N/A;   VASCULAR  SURGERY Left 07/2015   stent in left thigh, Location:[place]    Home Medications:  Allergies as of 04/29/2022       Reactions   Iodinated Contrast Media Other (See Comments)   hypotension   Oxycodone-acetaminophen Itching        Medication List        Accurate as of April 29, 2022  9:45 AM. If you have any questions, ask your nurse or doctor.          amLODipine 10 MG tablet Commonly known as: NORVASC Take 10 mg by mouth daily.   amoxicillin-clavulanate 500-125 MG tablet Commonly known as: AUGMENTIN Take 1 tablet by mouth daily.   aspirin EC 81 MG  tablet Take 1 tablet (81 mg total) by mouth daily. Swallow whole.   calcium acetate 667 MG tablet Commonly known as: PHOSLO Take 2,001 mg by mouth 3 (three) times daily.   clopidogrel 75 MG tablet Commonly known as: PLAVIX Take 75 mg by mouth daily.   escitalopram 10 MG tablet Commonly known as: LEXAPRO Take 10 mg by mouth daily.   furosemide 80 MG tablet Commonly known as: LASIX Take 80 mg by mouth 2 (two) times daily.   gabapentin 100 MG capsule Commonly known as: NEURONTIN Take by mouth.   HYDROcodone-acetaminophen 10-325 MG tablet Commonly known as: NORCO Take 1 tablet by mouth every 6 (six) hours.   lisinopril 40 MG tablet Commonly known as: ZESTRIL Take 40 mg by mouth daily.   methocarbamol 750 MG tablet Commonly known as: ROBAXIN Take 750 mg by mouth 3 (three) times daily.   midodrine 10 MG tablet Commonly known as: PROAMATINE Take 1 tablet (10 mg total) by mouth daily as needed (Take 30 min prior to HD session).   naloxone 4 MG/0.1ML Liqd nasal spray kit Commonly known as: NARCAN 1 spray as directed.   omeprazole 40 MG capsule Commonly known as: PRILOSEC Take 40 mg by mouth daily.   simvastatin 10 MG tablet Commonly known as: ZOCOR Take 10 mg by mouth daily.   Veltassa 8.4 g packet Generic drug: patiromer Take 8.4 g by mouth daily.   Ventolin HFA 108 (90 Base) MCG/ACT inhaler Generic drug: albuterol Inhale 1-2 puffs into the lungs daily as needed for shortness of breath or wheezing.        Allergies:  Allergies  Allergen Reactions   Iodinated Contrast Media Other (See Comments)    hypotension   Oxycodone-Acetaminophen Itching    Family History: Family History  Problem Relation Age of Onset   Hypertension Mother    Polycystic kidney disease Mother    Hypertension Sister     Social History:  reports that he has been smoking cigarettes. He has been smoking an average of .25 packs per day. He has never used smokeless tobacco. He  reports current drug use. Drug: Marijuana. He reports that he does not drink alcohol.  ROS: Pertinent ROS in HPI  Physical Exam: BP (!) 140/84   Pulse 89   Ht 6' (1.829 m)   Wt 196 lb 11.2 oz (89.2 kg)   BMI 26.68 kg/m   Constitutional:  Well nourished. Alert and oriented, No acute distress. HEENT:  AT, moist mucus membranes.  Trachea midline Cardiovascular: No clubbing, cyanosis, or edema. Respiratory: Normal respiratory effort, no increased work of breathing. GU: No CVA tenderness.  No bladder fullness or masses.  Patient with circumcised phallus.   Urethral meatus is patent.  No penile discharge. No penile lesions or rashes.  Scrotum without lesions, cysts, rashes and/or edema.  There is a Peyronie's plaque palpated at the left base and a Peyronie's plaque palpated at the anterior portion of the penis approximately 2 cm down from the coronal ridge. Neurologic: Grossly intact, no focal deficits, moving all 4 extremities. Psychiatric: Normal mood and affect.  Laboratory Data: Lab Results  Component Value Date   WBC 4.1 03/22/2022   HGB 11.1 (L) 03/22/2022   HCT 36.7 (L) 03/22/2022   MCV 97.6 03/22/2022   PLT 161 03/22/2022    Lab Results  Component Value Date   CREATININE 5.04 (H) 03/22/2022    Lab Results  Component Value Date   AST 17 03/21/2022   Lab Results  Component Value Date   ALT 9 03/21/2022    Urinalysis    Component Value Date/Time   COLORURINE YELLOW (A) 03/21/2022 2127   APPEARANCEUR CLEAR (A) 03/21/2022 2127   APPEARANCEUR Clear 02/28/2021 1103   LABSPEC 1.005 03/21/2022 2127   PHURINE 9.0 (H) 03/21/2022 2127   GLUCOSEU 150 (A) 03/21/2022 2127   HGBUR LARGE (A) 03/21/2022 2127   BILIRUBINUR NEGATIVE 03/21/2022 2127   BILIRUBINUR Negative 02/28/2021 [place] 03/21/2022 2127   PROTEINUR 100 (A) 03/21/2022 2127   NITRITE NEGATIVE 03/21/2022 2127   LEUKOCYTESUR NEGATIVE 03/21/2022 2127  I have reviewed the labs.   Pertinent  Imaging: N/A   Assessment & Plan:    1.  Peyronie's disease -He is still with some bothersome curvature and does have a second dose available to him -He was hesitant to go through a second cycle, so we discussed using a vacuum erectile device to help treat the Peyronie's disease or referral to [facility] to discuss possible surgical correction, after some deliberation, he decided on the Xiaflex -He would like to proceed with a second Xiaflex cycle  2.  Erectile dysfunction -He states when he has erections, an area on the right below the coronal ridge does not seem to become firm -We will investigate this further when he presents for his induction of erection for plaque marking and photographs to prepare for Xiaflex injections  Return for schedule Xiaflex injections .  These notes generated with voice recognition software. I apologize for typographical errors.  [place], [place] [address]  [place], [place] [address] [phone]

## 2022-04-28 NOTE — Telephone Encounter (Signed)
Patient was scheduled originally for this month March to received 2nd round of Xiaflex injection. Patient was last seen in March 2023 and was advised to schedule 2nd round of Xiaflex around April 2023 but Dr Delana Meyer asked patient to hold off for 4 months. Patient did not call to schedule until December 2023 and was scheduled in January 2024, this was rescheduled to march 2024. Upon further look per Dr Erlene Quan patient needs an OV before getting Xiaflex cycle done since its been a year since he was seen to evaluate that he would still be a candidate for this. I called patient and left him a message about this. I cancelled all the appointments except the appointment with [facility] for 04/29/22 to follow up.  Also, since it has been over a year since benefits were checked for Xiaflex per [employer] need to sign form to re submit. We do have Xiaflex medication at the office already so just need to verify buy and bill benefits only per [employer] representative. Form will be faxed over once patient signs tomorrow during OV.

## 2022-04-29 ENCOUNTER — Ambulatory Visit (INDEPENDENT_AMBULATORY_CARE_PROVIDER_SITE_OTHER): Payer: 59 | Admitting: Urology

## 2022-04-29 ENCOUNTER — Encounter: Payer: Self-pay | Admitting: Urology

## 2022-04-29 ENCOUNTER — Ambulatory Visit: Payer: 59 | Admitting: Urology

## 2022-04-29 VITALS — BP 140/84 | HR 89 | Ht 72.0 in | Wt 196.7 lb

## 2022-04-29 DIAGNOSIS — N529 Male erectile dysfunction, unspecified: Secondary | ICD-10-CM

## 2022-04-29 DIAGNOSIS — N486 Induration penis plastica: Secondary | ICD-10-CM | POA: Diagnosis not present

## 2022-04-30 ENCOUNTER — Ambulatory Visit: Payer: 59 | Admitting: Urology

## 2022-05-01 ENCOUNTER — Ambulatory Visit: Payer: 59 | Admitting: Urology

## 2022-05-26 NOTE — Progress Notes (Deleted)
   05/26/2022 8:11 AM  Oscar Moody. 1970-10-14 [id number]   Referring provider: Danae Orleans, MD No address on file   No chief complaint on file.   HPI: Oscar Moody. is a 52 y.o. male who presents today for erection induction, plaque marking, curvature measurement and documentation.    The procedure is discussed with patient.  He is allowed to ask questions.  Questions were answered to his satisfaction.  We were able to proceed to the titration.  Physical Exam:  There were no vitals taken for this visit.  Constitutional:  Well nourished. Alert and oriented, No acute distress. GU: Patient with circumcised/uncircumcised phallus. ***Foreskin easily retracted***  Urethral meatus is patent.  No penile discharge. No penile lesions or rashes. Scrotum without lesions, cysts, rashes and/or edema.   Psychiatric: Normal mood and affect.   Procedure *** Patient's left corpus cavernosum is identified.  An area near the base of the penis is cleansed with rubbing alcohol.  Careful to avoid the dorsal vein, *** mcg of Trimix (papaverine 30 mg, phentolamine 1 mg and prostaglandin E1 10 mcg, Lot # ***@*** exp # *** is injected at a 90 degree angle into the left *** corpus cavernosum near the base of the penis.  Patient experienced a very firm erection in 15 minutes.     Assessment & Plan:    1. Peyronie's disease       No follow-ups on file.  Oscar Moody  [place] [address] [place], [place] [address] [phone]

## 2022-05-27 ENCOUNTER — Ambulatory Visit: Payer: 59 | Admitting: Urology

## 2022-05-27 ENCOUNTER — Encounter: Payer: Self-pay | Admitting: Urology

## 2022-05-27 DIAGNOSIS — N486 Induration penis plastica: Secondary | ICD-10-CM

## 2022-05-28 ENCOUNTER — Ambulatory Visit: Payer: 59 | Admitting: Urology

## 2022-05-28 NOTE — Progress Notes (Incomplete)
Oscar Moody,acting as a scribe for Oscar Espy, MD.,have documented all relevant documentation on the behalf of Oscar Espy, MD,as directed by  Oscar Espy, MD while in the presence of Oscar Espy, MD.  05/28/2022 1:36 PM   Oscar Moody. 03/02/1970 [id number]  Referring provider: Danae Orleans, MD No address on file  No chief complaint on file.   HPI:    PMH: Past Medical History:  Diagnosis Date   Anxiety    Arthritis    Asthma    childhood asthma   Chronic kidney disease    Current every day smoker    DVT (deep venous thrombosis) (HCC)    Dyspnea    with exertion   GERD (gastroesophageal reflux disease)    Headache    Hypercholesteremia    Hypertension    Peripheral vascular disease ([place])     Surgical History: Past Surgical History:  Procedure Laterality Date   AV FISTULA PLACEMENT Right 12/26/2015   Procedure: ARTERIOVENOUS (AV) FISTULA CREATION ( RADIAL CEPHALIC );  Surgeon: Katha Cabal, MD;  Location: [facility];  Service: Vascular;  Laterality: Right;   FRACTURE SURGERY Left    screw in left wrist   LOWER EXTREMITY ANGIOGRAPHY Right 07/30/2021   Procedure: Lower Extremity Angiography;  Surgeon: Katha Cabal, MD;  Location: [facility];  Service: Cardiovascular;  Laterality: Right;   PERIPHERAL VASCULAR CATHETERIZATION Right 03/28/2016   Procedure: A/V Fistulagram;  Surgeon: Katha Cabal, MD;  Location: [facility];  Service: Cardiovascular;  Laterality: Right;   PERIPHERAL VASCULAR CATHETERIZATION N/A 03/28/2016   Procedure: A/V Shunt Intervention;  Surgeon: Katha Cabal, MD;  Location: [facility];  Service: Cardiovascular;  Laterality: N/A;   VASCULAR SURGERY Left 07/2015   stent in left thigh, Location:[place] [facility]    Home Medications:  Allergies as of 05/28/2022       Reactions   Iodinated Contrast Media Other (See Comments)   hypotension   Oxycodone-acetaminophen Itching         Medication List        Accurate as of May 28, 2022  1:36 PM. If you have any questions, ask your nurse or doctor.          amLODipine 10 MG tablet Commonly known as: NORVASC Take 10 mg by mouth daily.   amoxicillin-clavulanate 500-125 MG tablet Commonly known as: AUGMENTIN Take 1 tablet by mouth daily.   aspirin EC 81 MG tablet Take 1 tablet (81 mg total) by mouth daily. Swallow whole.   calcium acetate 667 MG tablet Commonly known as: PHOSLO Take 2,001 mg by mouth 3 (three) times daily.   clopidogrel 75 MG tablet Commonly known as: PLAVIX Take 75 mg by mouth daily.   escitalopram 10 MG tablet Commonly known as: LEXAPRO Take 10 mg by mouth daily.   furosemide 80 MG tablet Commonly known as: LASIX Take 80 mg by mouth 2 (two) times daily.   gabapentin 100 MG capsule Commonly known as: NEURONTIN Take by mouth.   HYDROcodone-acetaminophen 10-325 MG tablet Commonly known as: NORCO Take 1 tablet by mouth every 6 (six) hours.   lisinopril 40 MG tablet Commonly known as: ZESTRIL Take 40 mg by mouth daily.   methocarbamol 750 MG tablet Commonly known as: ROBAXIN Take 750 mg by mouth 3 (three) times daily.   midodrine 10 MG tablet Commonly known as: PROAMATINE Take 1 tablet (10 mg total) by mouth daily as needed (Take 30 min  prior to HD session).   naloxone 4 MG/0.1ML Liqd nasal spray kit Commonly known as: NARCAN 1 spray as directed.   omeprazole 40 MG capsule Commonly known as: PRILOSEC Take 40 mg by mouth daily.   simvastatin 10 MG tablet Commonly known as: ZOCOR Take 10 mg by mouth daily.   Veltassa 8.4 g packet Generic drug: patiromer Take 8.4 g by mouth daily.   Ventolin HFA 108 (90 Base) MCG/ACT inhaler Generic drug: albuterol Inhale 1-2 puffs into the lungs daily as needed for shortness of breath or wheezing.        Allergies:  Allergies  Allergen Reactions   Iodinated Contrast Media Other (See Comments)    hypotension    Oxycodone-Acetaminophen Itching    Family History: Family History  Problem Relation Age of Onset   Hypertension Mother    Polycystic kidney disease Mother    Hypertension Sister     Social History:  reports that he has been smoking cigarettes. He has been smoking an average of .25 packs per day. He has never used smokeless tobacco. He reports current drug use. Drug: Marijuana. He reports that he does not drink alcohol.   Physical Exam: There were no vitals taken for this visit.  Constitutional:  Alert and oriented, No acute distress. HEENT: [place] AT, moist mucus membranes.  Trachea midline, no masses. Neurologic: Grossly intact, no focal deficits, moving all 4 extremities. Psychiatric: Normal mood and affect.   Assessment & Plan:    @DIAGMED @  No follow-ups on file.   [place] [address], [place], [place] [address] [phone]

## 2022-05-29 ENCOUNTER — Telehealth (INDEPENDENT_AMBULATORY_CARE_PROVIDER_SITE_OTHER): Payer: Self-pay

## 2022-05-29 ENCOUNTER — Ambulatory Visit (INDEPENDENT_AMBULATORY_CARE_PROVIDER_SITE_OTHER): Payer: 59 | Admitting: Urology

## 2022-05-29 DIAGNOSIS — N486 Induration penis plastica: Secondary | ICD-10-CM

## 2022-05-29 NOTE — Telephone Encounter (Signed)
Spoke with the patient and he is scheduled with Dr. Delana Meyer on 06/10/22 with a 6:45 am arrival time to the [facility] for a right arm fistulagram. Pre-procedure instructions were discussed and will be mailed.

## 2022-06-01 NOTE — Progress Notes (Signed)
Patient did not present to the first two visit.

## 2022-06-04 ENCOUNTER — Ambulatory Visit: Payer: 59 | Admitting: Urology

## 2022-06-05 ENCOUNTER — Ambulatory Visit: Payer: 59 | Admitting: Urology

## 2022-06-05 ENCOUNTER — Telehealth (INDEPENDENT_AMBULATORY_CARE_PROVIDER_SITE_OTHER): Payer: Self-pay

## 2022-06-05 NOTE — Telephone Encounter (Signed)
I attempted to contact the patient to let him know he is being rescheduled with Dr. Gilda Crease. A message was left for a return call.

## 2022-06-09 NOTE — Telephone Encounter (Signed)
Spoke with the patient and he was given his arrival time of 1:15 pm on 06/10/22 to the [facility] for his procedure. Patient understood.

## 2022-06-10 ENCOUNTER — Encounter: Payer: Self-pay | Admitting: Vascular Surgery

## 2022-06-10 ENCOUNTER — Encounter
Admission: RE | Disposition: A | Discharge status: Home / Self Care | Payer: Self-pay | Source: Home / Self Care | Attending: Vascular Surgery

## 2022-06-10 ENCOUNTER — Ambulatory Visit
Admission: RE | Admit: 2022-06-10 | Discharge: 2022-06-10 | Disposition: A | Discharge status: Home / Self Care | Payer: 59 | Attending: Vascular Surgery | Admitting: Vascular Surgery

## 2022-06-10 DIAGNOSIS — F17210 Nicotine dependence, cigarettes, uncomplicated: Secondary | ICD-10-CM | POA: Insufficient documentation

## 2022-06-10 DIAGNOSIS — Z992 Dependence on renal dialysis: Secondary | ICD-10-CM | POA: Diagnosis not present

## 2022-06-10 DIAGNOSIS — N186 End stage renal disease: Secondary | ICD-10-CM

## 2022-06-10 DIAGNOSIS — T82898A Other specified complication of vascular prosthetic devices, implants and grafts, initial encounter: Secondary | ICD-10-CM | POA: Diagnosis not present

## 2022-06-10 DIAGNOSIS — T82858A Stenosis of vascular prosthetic devices, implants and grafts, initial encounter: Secondary | ICD-10-CM

## 2022-06-10 DIAGNOSIS — Y841 Kidney dialysis as the cause of abnormal reaction of the patient, or of later complication, without mention of misadventure at the time of the procedure: Secondary | ICD-10-CM | POA: Insufficient documentation

## 2022-06-10 DIAGNOSIS — I70211 Atherosclerosis of native arteries of extremities with intermittent claudication, right leg: Secondary | ICD-10-CM | POA: Insufficient documentation

## 2022-06-10 DIAGNOSIS — I120 Hypertensive chronic kidney disease with stage 5 chronic kidney disease or end stage renal disease: Secondary | ICD-10-CM | POA: Diagnosis not present

## 2022-06-10 HISTORY — PX: A/V FISTULAGRAM: CATH118298

## 2022-06-10 LAB — POTASSIUM (ARMC VASCULAR LAB ONLY): Potassium (ARMC vascular lab): 5 mmol/L (ref 3.5–5.1)

## 2022-06-10 SURGERY — A/V FISTULAGRAM
Anesthesia: Moderate Sedation | Laterality: Right

## 2022-06-10 MED ORDER — DIPHENHYDRAMINE HCL 50 MG/ML IJ SOLN
INTRAMUSCULAR | Status: AC
  Administered 2022-06-10: 25 mg via INTRAVENOUS
  Filled 2022-06-10: qty 1

## 2022-06-10 MED ORDER — CEFAZOLIN SODIUM-DEXTROSE 1-4 GM/50ML-% IV SOLN
INTRAVENOUS | Status: AC
  Filled 2022-06-10: qty 50

## 2022-06-10 MED ORDER — CEFAZOLIN SODIUM-DEXTROSE 1-4 GM/50ML-% IV SOLN
1.0000 g | Freq: Once | INTRAVENOUS | Status: AC
  Administered 2022-06-10: 1 g via INTRAVENOUS

## 2022-06-10 MED ORDER — CLOPIDOGREL BISULFATE 300 MG PO TABS: 300.0000 mg | ORAL_TABLET | ORAL | Status: AC

## 2022-06-10 MED ORDER — CLOPIDOGREL BISULFATE 75 MG PO TABS
ORAL_TABLET | ORAL | Status: AC
  Administered 2022-06-10: 300 mg via ORAL
  Filled 2022-06-10: qty 4

## 2022-06-10 MED ORDER — IODIXANOL 320 MG/ML IV SOLN
INTRAVENOUS | Status: DC | PRN
  Administered 2022-06-10: 35 mL

## 2022-06-10 MED ORDER — MIDAZOLAM HCL 2 MG/2ML IJ SOLN
INTRAMUSCULAR | Status: DC | PRN
  Administered 2022-06-10: 1 mg via INTRAVENOUS
  Administered 2022-06-10: 2 mg via INTRAVENOUS

## 2022-06-10 MED ORDER — PANTOPRAZOLE SODIUM 40 MG PO TBEC: 40.0000 mg | DELAYED_RELEASE_TABLET | Freq: Every day | ORAL | 11 refills | Status: DC

## 2022-06-10 MED ORDER — SODIUM CHLORIDE 0.9 % IV SOLN
INTRAVENOUS | Status: DC
  Administered 2022-06-10: 1000 mL via INTRAVENOUS

## 2022-06-10 MED ORDER — FAMOTIDINE 20 MG PO TABS: 40.0000 mg | ORAL_TABLET | Freq: Once | ORAL | Status: AC | PRN

## 2022-06-10 MED ORDER — CEFAZOLIN SODIUM-DEXTROSE 2-4 GM/100ML-% IV SOLN: 2.0000 g | INTRAVENOUS | Status: DC

## 2022-06-10 MED ORDER — HEPARIN SODIUM (PORCINE) 1000 UNIT/ML IJ SOLN
INTRAMUSCULAR | Status: AC
  Filled 2022-06-10: qty 10

## 2022-06-10 MED ORDER — FAMOTIDINE 20 MG PO TABS
ORAL_TABLET | ORAL | Status: AC
  Administered 2022-06-10: 40 mg via ORAL
  Filled 2022-06-10: qty 2

## 2022-06-10 MED ORDER — HYDROMORPHONE HCL 1 MG/ML IJ SOLN: 1.0000 mg | Freq: Once | INTRAMUSCULAR | Status: DC | PRN

## 2022-06-10 MED ORDER — ONDANSETRON HCL 4 MG/2ML IJ SOLN: 4.0000 mg | Freq: Four times a day (QID) | INTRAMUSCULAR | Status: DC | PRN

## 2022-06-10 MED ORDER — METHYLPREDNISOLONE SODIUM SUCC 125 MG IJ SOLR
INTRAMUSCULAR | Status: AC
  Administered 2022-06-10: 125 mg via INTRAVENOUS
  Filled 2022-06-10: qty 2

## 2022-06-10 MED ORDER — CEFAZOLIN SODIUM-DEXTROSE 2-4 GM/100ML-% IV SOLN: 2.0000 g | Freq: Once | INTRAVENOUS | Status: DC

## 2022-06-10 MED ORDER — MIDAZOLAM HCL 5 MG/5ML IJ SOLN
INTRAMUSCULAR | Status: AC
  Filled 2022-06-10: qty 5

## 2022-06-10 MED ORDER — METHYLPREDNISOLONE SODIUM SUCC 125 MG IJ SOLR: 125.0000 mg | Freq: Once | INTRAMUSCULAR | Status: AC | PRN

## 2022-06-10 MED ORDER — DIPHENHYDRAMINE HCL 50 MG/ML IJ SOLN: 50.0000 mg | Freq: Once | INTRAMUSCULAR | Status: AC | PRN

## 2022-06-10 MED ORDER — FENTANYL CITRATE (PF) 100 MCG/2ML IJ SOLN
INTRAMUSCULAR | Status: AC
  Filled 2022-06-10: qty 2

## 2022-06-10 MED ORDER — FENTANYL CITRATE (PF) 100 MCG/2ML IJ SOLN
INTRAMUSCULAR | Status: DC | PRN
  Administered 2022-06-10 (×2): 50 ug via INTRAVENOUS

## 2022-06-10 MED ORDER — CLOPIDOGREL BISULFATE 75 MG PO TABS: 75.0000 mg | ORAL_TABLET | Freq: Every day | ORAL | 11 refills | Status: DC

## 2022-06-10 MED ORDER — MIDAZOLAM HCL 2 MG/ML PO SYRP: 8.0000 mg | ORAL_SOLUTION | Freq: Once | ORAL | Status: DC | PRN

## 2022-06-10 MED ORDER — DEXTROSE 5 % IV SOLN: 1000.0000 mg | Freq: Once | INTRAVENOUS | Status: DC

## 2022-06-10 SURGICAL SUPPLY — 20 items
BALLN ATG 12X4X80 (BALLOONS) ×1
BALLN DORADO 10X40X80 (BALLOONS) ×1
BALLOON ATG 12X4X80 (BALLOONS) IMPLANT
BALLOON DORADO 10X40X80 (BALLOONS) IMPLANT
COVER PROBE ULTRASOUND 5X96 (MISCELLANEOUS) IMPLANT
DRAPE BRACHIAL (DRAPES) IMPLANT
GLIDECATH ANGL TAPER 5F (CATHETERS) IMPLANT
GLIDEWIRE ADV .035X260CM (WIRE) IMPLANT
GOWN STRL REUS W/ TWL LRG LVL3 (GOWN DISPOSABLE) ×1 IMPLANT
GOWN STRL REUS W/TWL LRG LVL3 (GOWN DISPOSABLE) ×1
KIT ENCORE 26 ADVANTAGE (KITS) IMPLANT
NDL ENTRY 21GA 7CM ECHOTIP (NEEDLE) IMPLANT
NEEDLE ENTRY 21GA 7CM ECHOTIP (NEEDLE) ×1 IMPLANT
PACK ANGIOGRAPHY (CUSTOM PROCEDURE TRAY) ×1 IMPLANT
SET INTRO CAPELLA COAXIAL (SET/KITS/TRAYS/PACK) IMPLANT
SHEATH BRITE TIP 6FRX5.5 (SHEATH) IMPLANT
SHEATH BRITE TIP 8FR 5.5 (SHEATH) IMPLANT
STENT VENOVO 14X40X80 (Permanent Stent) IMPLANT
STENT VENOVO 16X40X80 (Permanent Stent) IMPLANT
SUT MNCRL AB 4-0 PS2 18 (SUTURE) IMPLANT

## 2022-06-10 NOTE — Op Note (Signed)
OPERATIVE NOTE   PROCEDURE: Contrast injection right brachiocephalic AV access Percutaneous transluminal angioplasty and stent placement at 2 locations both peripheral right brachiocephalic fistula  PRE-OPERATIVE DIAGNOSIS: Complication of dialysis access                                                       End Stage Renal Disease  POST-OPERATIVE DIAGNOSIS: same as above   SURGEON: Renford Dills, M.D.  ANESTHESIA: Conscious sedation was administered under my direct supervision by the interventional radiology RN. IV Versed plus fentanyl were utilized. Continuous ECG, pulse oximetry and blood pressure was monitored throughout the entire procedure.  Conscious sedation was for a total of 37 minutes.  ESTIMATED BLOOD LOSS: minimal  FINDING(S): To greater than 80% strictures within the cephalic vein peripheral segment  SPECIMEN(S):  None  CONTRAST: 35 cc  FLUOROSCOPY TIME: 42 minutes  INDICATIONS: Oscar Moody. is a 52 y.o. male who  presents with malfunctioning right arm AV access.  The patient is scheduled for angiography with possible intervention of the AV access.  The patient is aware the risks include but are not limited to: bleeding, infection, thrombosis of the cannulated access, and possible anaphylactic reaction to the contrast.  The patient acknowledges if the access can not be salvaged a tunneled catheter will be needed and will be placed during this procedure.  The patient is aware of the risks of the procedure and elects to proceed with the angiogram and intervention.  DESCRIPTION: After full informed written consent was obtained, the patient was brought back to the Special Procedure suite and placed supine position.  Appropriate cardiopulmonary monitors were placed.  The right arm was prepped and draped in the standard fashion.  Appropriate timeout is called. The right brachiocephalic fistula was cannulated with a micropuncture needle.  Cannulation was performed  with ultrasound guidance. Ultrasound was placed in a sterile sleeve, the AV access was interrogated and noted to be echolucent and compressible indicating patency. Image was recorded for the permanent record. The puncture is performed under continuous ultrasound visualization.   The microwire was advanced and the needle was exchanged for  a microsheath.  The J-wire was then advanced and a 6 Fr sheath inserted.  Hand injections were completed to image the access from the arterial anastomosis through the entire access.  The central venous structures were also imaged by hand injections.  Interpretation:  The portion of the the fistula being used for cannulation from the antecubital fossa proximally is aneurysmal but otherwise widely patent and free of other abnormalities.  In the mid cephalic vein (at the mid forearm level) there is a 10 mm long 90% stenosis.  Up at the shoulder essentially near the deltopectoral groove there is a 90% 10 to 15 mm long stenosis.  The confluence of the cephalic vein with the subclavian is widely patent.  Subclavian appears to have moderate narrowing of perhaps 30%.  Innominate and superior vena cava are widely patent.  Based on the images,  a wire was negotiated through the strictures within the venous portion of the graft.  The mid arm lesion was treated first.  A 12 mm x 40 mm Venovo stent was deployed across this lesion and then postdilated to 10 mm with a noncompliant balloon.  Inflation was to approximately 20 atm for about 1  minute.  Follow-up imaging demonstrated excellent result with excellent stent placement and less than 15 % residual stenosis.  The detector was then repositioned and the stricture at the level of the deltoid muscle was addressed.  A 16 mm x 40 mm Venovo stent was deployed and then postdilated with a 12 mm x 40 mm Atlas balloon.  Inflation was to approximately 14 atm for about 1 minute.  Follow-up imaging demonstrated less than 15% residual stenosis.  A  4-0 Monocryl purse-string suture was sewn around the sheath.  The sheath was removed and light pressure was applied.  A sterile bandage was applied to the puncture site.    COMPLICATIONS: None  CONDITION: [name], M.D [facility] Office: [phone]  06/10/2022 4:13 PM

## 2022-06-11 ENCOUNTER — Encounter: Payer: Self-pay | Admitting: Vascular Surgery

## 2022-06-16 NOTE — Interval H&P Note (Signed)
History and Physical Interval Note:  06/16/2022 3:57 PM  Oscar Moody.  has presented today for surgery, with the diagnosis of R arm fistulagram   End Stage Renal.  The various methods of treatment have been discussed with the patient and family. After consideration of risks, benefits and other options for treatment, the patient has consented to  Procedure(s): A/V Fistulagram (Right) as a surgical intervention.  The patient's history has been reviewed, patient examined, no change in status, stable for surgery.  I have reviewed the patient's chart and labs.  Questions were answered to the patient's satisfaction.     Levora Dredge

## 2022-06-16 NOTE — H&P (Signed)
MRN : [id number]  Oscar Moody. is a 52 y.o. (July 25, 1970) male who presents with chief complaint of check access.  History of Present Illness:   The patient returns to the office for follow up regarding a problem with their dialysis access.  The patient had a revision of his right radiocephalic AV fistula to a brachiocephalic in 2020 by [facility]   The patient notes a significant increase in bleeding time after decannulation.  The patient has also been informed that there is increased recirculation.    The patient denies hand pain or other symptoms consistent with steal phenomena.  No significant arm swelling.  No pain in his upper extremity.   The patient denies redness or swelling at the access site. The patient denies fever or chills at home or while on dialysis.   No recent shortening of the patient's walking distance or new symptoms consistent with claudication.  No history of rest pain symptoms. No new ulcers or wounds of the lower extremities have occurred.   Duplex ultrasound of the AV access shows a patent access.  The previously noted stenosis is significantly increased compared to last study.  Flow volume today is 2536 cc/min   Current Meds  Medication Sig   amLODipine (NORVASC) 10 MG tablet Take 10 mg by mouth daily.   aspirin EC 81 MG tablet Take 1 tablet (81 mg total) by mouth daily. Swallow whole.   calcium acetate (PHOSLO) 667 MG tablet Take 2,001 mg by mouth 3 (three) times daily.   clopidogrel (PLAVIX) 75 MG tablet Take 75 mg by mouth daily.   clopidogrel (PLAVIX) 75 MG tablet Take 1 tablet (75 mg total) by mouth daily.   escitalopram (LEXAPRO) 10 MG tablet Take 10 mg by mouth daily.   furosemide (LASIX) 80 MG tablet Take 80 mg by mouth 2 (two) times daily.   HYDROcodone-acetaminophen (NORCO) 10-325 MG tablet Take 1 tablet by mouth every 6 (six) hours.   lisinopril (PRINIVIL,ZESTRIL) 40 MG tablet Take 40 mg by mouth daily.   methocarbamol  (ROBAXIN) 750 MG tablet Take 750 mg by mouth 3 (three) times daily.   midodrine (PROAMATINE) 10 MG tablet Take 1 tablet (10 mg total) by mouth daily as needed (Take 30 min prior to HD session).   pantoprazole (PROTONIX) 40 MG tablet Take 1 tablet (40 mg total) by mouth daily.   simvastatin (ZOCOR) 10 MG tablet Take 10 mg by mouth daily.   VELTASSA 8.4 g packet Take 8.4 g by mouth daily.   [DISCONTINUED] omeprazole (PRILOSEC) 40 MG capsule Take 40 mg by mouth daily.    Past Medical History:  Diagnosis Date   Anxiety    Arthritis    Asthma    childhood asthma   Chronic kidney disease    Current every day smoker    DVT (deep venous thrombosis)    Dyspnea    with exertion   GERD (gastroesophageal reflux disease)    Headache    Hypercholesteremia    Hypertension    Peripheral vascular disease     Past Surgical History:  Procedure Laterality Date   A/V FISTULAGRAM Right 06/10/2022   Procedure: A/V Fistulagram;  Surgeon: Renford Dills, MD;  Location: [facility];  Service: Cardiovascular;  Laterality: Right;   AV FISTULA PLACEMENT Right 12/26/2015   Procedure: ARTERIOVENOUS (AV) FISTULA CREATION ( RADIAL  CEPHALIC );  Surgeon: Renford Dills, MD;  Location: [facility];  Service: Vascular;  Laterality: Right;   FRACTURE SURGERY Left    screw in left wrist   LOWER EXTREMITY ANGIOGRAPHY Right 07/30/2021   Procedure: Lower Extremity Angiography;  Surgeon: Renford Dills, MD;  Location: [facility];  Service: Cardiovascular;  Laterality: Right;   PERIPHERAL VASCULAR CATHETERIZATION Right 03/28/2016   Procedure: A/V Fistulagram;  Surgeon: Renford Dills, MD;  Location: [facility];  Service: Cardiovascular;  Laterality: Right;   PERIPHERAL VASCULAR CATHETERIZATION N/A 03/28/2016   Procedure: A/V Shunt Intervention;  Surgeon: Renford Dills, MD;  Location: [facility];  Service: Cardiovascular;  Laterality: N/A;   VASCULAR SURGERY Left 07/2015    stent in left thigh, Location:[facility] [facility]    Social History Social History   Tobacco Use   Smoking status: Every Day    Packs/day: .25    Types: Cigarettes   Smokeless tobacco: Never  Vaping Use   Vaping Use: Never used  Substance Use Topics   Alcohol use: No   Drug use: Not Currently    Types: Marijuana    Comment: occassional (when out of pain medication)    Family History Family History  Problem Relation Age of Onset   Hypertension Mother    Polycystic kidney disease Mother    Hypertension Sister     Allergies  Allergen Reactions   Iodinated Contrast Media Other (See Comments)    hypotension   Oxycodone-Acetaminophen Itching     REVIEW OF SYSTEMS (Negative unless checked)  Constitutional: Weight loss  Fever  Chills Cardiac: Chest pain   Chest pressure   Palpitations   Shortness of breath when laying flat   Shortness of breath with exertion. Vascular:  Pain in legs with walking   Pain in legs at rest  History of DVT   Phlebitis   Swelling in legs   Varicose veins   Non-healing ulcers Pulmonary:   Uses home oxygen   Productive cough   Hemoptysis   Wheeze  COPD   Asthma Neurologic:  Dizziness   Seizures   History of stroke   History of TIA  Aphasia   Vissual changes   Weakness or numbness in arm   Weakness or numbness in leg Musculoskeletal:   Joint swelling   Joint pain   Low back pain Hematologic:  Easy bruising  Easy bleeding   Hypercoagulable state   Anemic Gastrointestinal:  Diarrhea   Vomiting  Gastroesophageal reflux/heartburn   Difficulty swallowing. Genitourinary:  Chronic kidney disease   Difficult urination  Frequent urination   Blood in urine Skin:  Rashes   Ulcers  Psychological:  History of anxiety    History of major depression.  Physical Examination  Vitals:   06/10/22 1553 06/10/22 1603 06/10/22 1615 06/10/22 1630  BP: (!) 170/75 (!) 168/78 (!) 166/82  (!) 172/79  Pulse: 75 74 74 75  Resp: Temp:      TempSrc:      SpO2: 98% 97% 95% 96%  Weight:      Height:       Body mass index is 26.31 kg/m. Gen: WD/WN, NAD Head: [place]/AT, No temporalis wasting.  Ear/Nose/Throat: Hearing grossly intact, nares w/o erythema or drainage Eyes: PER, EOMI, sclera nonicteric.  Neck: Supple, no gross masses or lesions.  No JVD.  Pulmonary:  Good air movement, no audible wheezing, no use of accessory muscles.  Cardiac: RRR, precordium  non-hyperdynamic. Vascular:   Right upper extremity access is moderately pulsatile with a staccato bruit Vessel Right Left  Radial Palpable Palpable  Brachial Palpable Palpable  Gastrointestinal: soft, non-distended. No guarding/no peritoneal signs.  Musculoskeletal: M/S 5/5 throughout.  No deformity.  Neurologic: CN 2-12 intact. Pain and light touch intact in extremities.  Symmetrical.  Speech is fluent. Motor exam as listed above. Psychiatric: Judgment intact, Mood & affect appropriate for pt's clinical situation. Dermatologic: No rashes or ulcers noted.  No changes consistent with cellulitis.   CBC Lab Results  Component Value Date   WBC 4.1 03/22/2022   HGB 11.1 (L) 03/22/2022   HCT 36.7 (L) 03/22/2022   MCV 97.6 03/22/2022   PLT 161 03/22/2022    BMET    Component Value Date/Time   NA 137 03/22/2022 0452   K 5.0 03/22/2022 0452   CL 102 03/22/2022 0452   CO2 23 03/22/2022 0452   GLUCOSE 157 (H) 03/22/2022 0452   BUN 20 03/22/2022 0452   CREATININE 5.04 (H) 03/22/2022 0452   CALCIUM 9.0 03/22/2022 0452   GFRNONAA 13 (L) 03/22/2022 0452   GFRAA 12 (L) 03/28/2016 0941   CrCl cannot be calculated (Patient's most recent lab result is older than the maximum 21 days allowed.).  COAG Lab Results  Component Value Date   INR 1.06 12/11/2015    Radiology PERIPHERAL VASCULAR CATHETERIZATION  Result Date: 06/10/2022 See surgical note for result.    Assessment/Plan 1. ESRD on hemodialysis  [facility]) Recommend:   The patient is experiencing increasing problems with their dialysis access.   Patient should have a fistulagram with the intention for intervention.  The intention for intervention is to restore appropriate flow and prevent thrombosis and possible loss of the access.  As well as improve the quality of dialysis therapy.   The risks, benefits and alternative therapies were reviewed in detail with the patient.  All questions were answered.  The patient agrees to proceed with angio/intervention.     The patient will follow up with me in the office after the procedure.    2. Essential hypertension Continue antihypertensive medications as already ordered, these medications have been reviewed and there are no changes at this time.   3. Atherosclerosis of native artery of right lower extremity with intermittent claudication [facility]) Patient had previous studies which showed stable peripheral arterial disease.  No intervention recommended at this time.  Patient will continue with follow-up as prescribed.   Levora Dredge, MD  06/16/2022 3:54 PM

## 2022-06-30 NOTE — Progress Notes (Unsigned)
   07/01/2022 9:01 AM  Oscar Moody. [birth year] [id number]   Referring provider: Jenell Milliner, MD No address on file   Chief Complaint  Patient presents with   Other    HPI: Oscar Moody. is a 52 y.o. male who presents today for erection induction and plaque marking.    The procedure is discussed with patient.  He is allowed to ask questions.  Questions were answered to his satisfaction.    1st cycle completed in 03/2021   Physical Exam:  BP (!) 168/91   Pulse 84   Ht 6' (1.829 m)   Wt 194 lb (88 kg)   BMI 26.31 kg/m   Constitutional:  Well nourished. Alert and oriented, No acute distress. GU: No CVA tenderness.  No bladder fullness or masses.  Patient with circumcised phallus. Urethral meatus is patent.  No penile discharge. No penile lesions or rashes. Scrotum without lesions, cysts, rashes and/or edema.   Psychiatric: Normal mood and affect.    Procedure  Patient's left corpus cavernosum is identified.  An area near the base of the penis is cleansed with rubbing alcohol.  Careful to avoid the dorsal vein, 10 mcg of Caverject 20 mcg Lot # [id number] exp # [id number] DEC 31 is injected at a 90 degree angle into the left corpus cavernosum near the base of the penis.  Patient experienced a very firm erection in 15 minutes.    We decided to inject the phenylephrine after photos were taken as he experienced priapism with this first cycle.  100 mcg of diluted phenylephrine (Lot # [id number] exp DEC 2024) was injected into the left corpora and he experienced detumescence almost immediately.   His BP was monitored during the procedure.    Assessment & Plan:    1.  Peyronie's disease -Plaque is marked and photos taken see above -return this afternoon for Xiaflex injection      Return for return this afternoon for XIaflex injection .  Hulan Fray  [facility] [address] [place], [place] [address] [phone]

## 2022-07-01 ENCOUNTER — Ambulatory Visit (INDEPENDENT_AMBULATORY_CARE_PROVIDER_SITE_OTHER): Payer: 59 | Admitting: Urology

## 2022-07-01 ENCOUNTER — Encounter: Payer: Self-pay | Admitting: Urology

## 2022-07-01 VITALS — BP 178/83 | HR 90 | Ht 72.0 in | Wt 197.0 lb

## 2022-07-01 VITALS — BP 168/91 | HR 84 | Ht 72.0 in | Wt 194.0 lb

## 2022-07-01 DIAGNOSIS — N486 Induration penis plastica: Secondary | ICD-10-CM | POA: Diagnosis not present

## 2022-07-01 MED ORDER — COLLAGENASE CLOSTRID HISTOLYT 0.9 MG IJ SOLR
0.9000 mg | Freq: Once | INTRAMUSCULAR | Status: AC
Start: 2022-07-01 — End: 2022-07-01
  Administered 2022-07-01: 0.9 mg via INTRAMUSCULAR

## 2022-07-01 MED ORDER — ALPROSTADIL (VASODILATOR) 20 MCG IC KIT
20.0000 ug | PACK | Freq: Once | INTRACAVERNOUS | Status: AC
Start: 2022-07-01 — End: 2022-07-01
  Administered 2022-07-01: 20 ug via BODY_CAVITY

## 2022-07-01 MED ORDER — PHENYLEPHRINE 100 MCG/ML FOR PRIAPISM (OUTPATIENT ~~LOC~~ UROLOGY USE ONLY)
100.0000 ug | Freq: Once | INTRAMUSCULAR | Status: AC
Start: 2022-07-01 — End: 2022-07-01
  Administered 2022-07-01: 100 ug via INTRACAVERNOUS

## 2022-07-01 NOTE — Addendum Note (Signed)
Addended by: Ples Specter L on: 07/01/2022 09:30 AM   Modules accepted: Orders

## 2022-07-01 NOTE — Progress Notes (Signed)
Oscar Moody. presents for an office/procedure visit. BP today is high He is complaint/noncompliant with BP medication. Greater than 140/90. Provider  notified. Pt advised to see PCP for blood pressure.  Pt voiced understanding.

## 2022-07-01 NOTE — Progress Notes (Signed)
Oscar Moody. presents for an office visit. BP today is ___151/81________. He is complaint with BP medication. Greater than 140/90. Provider  notified. Pt advised to__f/u with PCP_______. Pt voiced understanding.

## 2022-07-01 NOTE — Progress Notes (Signed)
Procedure: Xiaflex injection   The plaque was easily palpable at the right mid shaft laterally. Xiaflex injection (0.58 mg) was then injected directly into the plaque at the point of maximal curvature at an angle after  to being warm to room temperature using a small TB needle.  He tolerated the procedure very well.   Vanna Scotland, MD

## 2022-07-02 ENCOUNTER — Ambulatory Visit (INDEPENDENT_AMBULATORY_CARE_PROVIDER_SITE_OTHER): Payer: 59 | Admitting: Urology

## 2022-07-02 ENCOUNTER — Other Ambulatory Visit: Payer: Self-pay

## 2022-07-02 VITALS — BP 148/79 | HR 97 | Ht 72.0 in | Wt 197.0 lb

## 2022-07-02 DIAGNOSIS — N99840 Postprocedural hematoma of a genitourinary system organ or structure following a genitourinary system procedure: Secondary | ICD-10-CM | POA: Diagnosis present

## 2022-07-02 DIAGNOSIS — N486 Induration penis plastica: Secondary | ICD-10-CM

## 2022-07-02 DIAGNOSIS — T50995A Adverse effect of other drugs, medicaments and biological substances, initial encounter: Secondary | ICD-10-CM | POA: Insufficient documentation

## 2022-07-02 LAB — CBC WITH DIFFERENTIAL/PLATELET
Abs Immature Granulocytes: 0.01 10*3/uL (ref 0.00–0.07)
Basophils Absolute: 0 10*3/uL (ref 0.0–0.1)
Basophils Relative: 1 %
Eosinophils Absolute: 0.2 10*3/uL (ref 0.0–0.5)
Eosinophils Relative: 3 %
HCT: 51.9 % (ref 39.0–52.0)
Hemoglobin: 16.4 g/dL (ref 13.0–17.0)
Immature Granulocytes: 0 %
Lymphocytes Relative: 21 %
Lymphs Abs: 1.1 10*3/uL (ref 0.7–4.0)
MCH: 29.3 pg (ref 26.0–34.0)
MCHC: 31.6 g/dL (ref 30.0–36.0)
MCV: 92.8 fL (ref 80.0–100.0)
Monocytes Absolute: 0.6 10*3/uL (ref 0.1–1.0)
Monocytes Relative: 10 %
Neutro Abs: 3.4 10*3/uL (ref 1.7–7.7)
Neutrophils Relative %: 65 %
Platelets: 186 10*3/uL (ref 150–400)
RBC: 5.59 MIL/uL (ref 4.22–5.81)
RDW: 14.7 % (ref 11.5–15.5)
WBC: 5.3 10*3/uL (ref 4.0–10.5)
nRBC: 0 % (ref 0.0–0.2)

## 2022-07-02 LAB — COMPREHENSIVE METABOLIC PANEL
ALT: 10 U/L (ref 0–44)
AST: 13 U/L — ABNORMAL LOW (ref 15–41)
Albumin: 4.4 g/dL (ref 3.5–5.0)
Alkaline Phosphatase: 44 U/L (ref 38–126)
Anion gap: 16 — ABNORMAL HIGH (ref 5–15)
BUN: 27 mg/dL — ABNORMAL HIGH (ref 6–20)
CO2: 25 mmol/L (ref 22–32)
Calcium: 10.3 mg/dL (ref 8.9–10.3)
Chloride: 92 mmol/L — ABNORMAL LOW (ref 98–111)
Creatinine, Ser: 6.63 mg/dL — ABNORMAL HIGH (ref 0.61–1.24)
GFR, Estimated: 9 mL/min — ABNORMAL LOW (ref >60)
Glucose, Bld: 94 mg/dL (ref 70–99)
Potassium: 4.2 mmol/L (ref 3.5–5.1)
Sodium: 133 mmol/L — ABNORMAL LOW (ref 135–145)
Total Bilirubin: 0.7 mg/dL (ref 0.3–1.2)
Total Protein: 7.4 g/dL (ref 6.5–8.1)

## 2022-07-02 MED ORDER — COLLAGENASE CLOSTRID HISTOLYT 0.9 MG IJ SOLR
0.9000 mg | Freq: Once | INTRAMUSCULAR | Status: AC
Start: 2022-07-02 — End: 2022-07-02
  Administered 2022-07-02: 0.9 mg via INTRAMUSCULAR

## 2022-07-02 NOTE — Progress Notes (Unsigned)
Xiaflex modeling  Patient's Peyronie's plaque is identified.  Wearing gloves, I grasped the plaque about 1 cm proximal and distal to the injection site, making sure to avoid direct pressure on the injection site.  Using the target plaque as a fulcrum point, I used both hands to apply firm, steady pressure to elongate and stretch the plaque.  The penis was bent opposite to the patient's penile curvature, with stretching to the point of moderate resistance. I held pressure for 30 seconds then released. After a 60 second rest period, I repeated the penile modeling technique for a total of 3 modeling attempts at 30 seconds for each attempt.  We discussed penile strengthening and bending exercises at home for the next 6 weeks; instruction sheet provided to the patient. He tolerated well with no concerns.  

## 2022-07-02 NOTE — Progress Notes (Signed)
Procedure: Xiaflex injection   The plaque was easily palpable at the right mid shaft laterally. Xiaflex injection (0.58 mg) was then injected directly into the plaque at the point of maximal curvature at an angle after  to being warm to room temperature using a small TB needle. Today's injection is slightly more lateral.  No swelling or bruising noted from yesterdays injection.  He tolerated the procedure very well.   Vanna Scotland, MD

## 2022-07-02 NOTE — ED Triage Notes (Addendum)
Pt reports genital bleeding and swelling. Reports having penile procedure done today at urology at 1400. States had same procedure the day before and is supposed to follow up tomorrow as well. Reports noticed the swelling at bleeding at 2030. Moderate size blood filled blister/hematoma noted to penis. No active external bleeding.  Pt ambulatory to triage. Alert and oriented following commands. Breathing unlabored speaking in full sentences.

## 2022-07-03 ENCOUNTER — Emergency Department
Admission: EM | Admit: 2022-07-03 | Discharge: 2022-07-03 | Disposition: A | Discharge status: Home / Self Care | Payer: 59 | Attending: Emergency Medicine | Admitting: Emergency Medicine

## 2022-07-03 ENCOUNTER — Ambulatory Visit (INDEPENDENT_AMBULATORY_CARE_PROVIDER_SITE_OTHER): Payer: 59 | Admitting: Urology

## 2022-07-03 ENCOUNTER — Encounter: Payer: Self-pay | Admitting: Urology

## 2022-07-03 VITALS — BP 122/77 | HR 95 | Ht 72.0 in | Wt 194.0 lb

## 2022-07-03 DIAGNOSIS — T887XXA Unspecified adverse effect of drug or medicament, initial encounter: Secondary | ICD-10-CM

## 2022-07-03 DIAGNOSIS — N486 Induration penis plastica: Secondary | ICD-10-CM

## 2022-07-03 DIAGNOSIS — N4889 Other specified disorders of penis: Secondary | ICD-10-CM

## 2022-07-03 NOTE — ED Provider Notes (Signed)
[facility] Provider Note    Event Date/Time   First MD Initiated Contact with Patient 07/03/22 0015     (approximate)   History   genital bleeding  and Post-op Problem (/)   HPI  Kalib Starkovich Agapito Weinhardt. is a 52 y.o. male who presents to the ED for evaluation of genital bleeding  and Post-op Problem (/)   I reviewed multiple urology clinic visits in the past couple days.  History of Peyronie disease and patient underwent a Xiaflex injection in the clinic.  He presents to the ED for evaluation of of a hematoma at the Xiaflex injection site from his clinic visit.  Reports voiding at baseline.  No additional trauma.   Physical Exam   Triage Vital Signs: ED Triage Vitals  Enc Vitals Group     BP 07/02/22 2211 (!) 185/155     Pulse Rate 07/02/22 2211 (!) 103     Resp 07/02/22 2211 18     Temp 07/02/22 2211 98.2 F (36.8 C)     Temp Source 07/02/22 2211 Oral     SpO2 07/02/22 2211 95 %     Weight 07/02/22 2210 194 lb (88 kg)     Height 07/02/22 2210 6' (1.829 m)     Head Circumference --      Peak Flow --      Pain Score 07/02/22 2210 7     Pain Loc --      Pain Edu? --      Excl. in GC? --     Most recent vital signs: Vitals:   07/02/22 2211  BP: (!) 185/155  Pulse: (!) 103  Resp: 18  Temp: 98.2 F (36.8 C)  SpO2: 95%    General: Awake, no distress.  CV:  Good peripheral perfusion.  Resp:  Normal effort.  Abd:  No distention.  MSK:  No deformity noted.  Neuro:  No focal deficits appreciated. Other:  Chaperoned GU exam with a small hematoma to the right sided shaft of the penis.  Closed hematoma.   ED Results / Procedures / Treatments   Labs (all labs ordered are listed, but only abnormal results are displayed) Labs Reviewed  COMPREHENSIVE METABOLIC PANEL - Abnormal; Notable for the following components:      Result Value   Sodium 133 (*)    Chloride 92 (*)    BUN 27 (*)    Creatinine, Ser 6.63 (*)    AST 13 (*)    GFR,  Estimated 9 (*)    Anion gap 16 (*)    All other components within normal limits  CBC WITH DIFFERENTIAL/PLATELET    EKG   RADIOLOGY   Official radiology report(s): No results found.  PROCEDURES and INTERVENTIONS:  Procedures  Medications - No data to display   IMPRESSION / MDM / ASSESSMENT AND PLAN / ED COURSE  I reviewed the triage vital signs and the nursing notes.  52 year old male presents to the ED with a postprocedural hematoma on the shaft of his penis after Xiaflex injection.  He is still voiding at baseline.  Suitable for outpatient management with urology follow-up.       FINAL CLINICAL IMPRESSION(S) / ED DIAGNOSES   Final diagnoses:  Medication side effect  Hematoma of penis     Rx / DC Orders   ED Discharge Orders     None        Note:  This document was prepared using Dragon voice recognition  software and may include unintentional dictation errors.   Delton Prairie, MD 07/03/22 0630

## 2022-07-08 ENCOUNTER — Other Ambulatory Visit (INDEPENDENT_AMBULATORY_CARE_PROVIDER_SITE_OTHER): Payer: Self-pay | Admitting: Vascular Surgery

## 2022-07-08 DIAGNOSIS — Z9862 Peripheral vascular angioplasty status: Secondary | ICD-10-CM

## 2022-07-08 DIAGNOSIS — N186 End stage renal disease: Secondary | ICD-10-CM

## 2022-07-10 ENCOUNTER — Ambulatory Visit (INDEPENDENT_AMBULATORY_CARE_PROVIDER_SITE_OTHER): Payer: 59 | Admitting: Nurse Practitioner

## 2022-07-10 ENCOUNTER — Ambulatory Visit (INDEPENDENT_AMBULATORY_CARE_PROVIDER_SITE_OTHER): Payer: 59

## 2022-07-10 ENCOUNTER — Encounter (INDEPENDENT_AMBULATORY_CARE_PROVIDER_SITE_OTHER): Payer: Self-pay | Admitting: Nurse Practitioner

## 2022-07-10 VITALS — BP 162/86 | HR 82 | Resp 16 | Wt 196.2 lb

## 2022-07-10 DIAGNOSIS — I739 Peripheral vascular disease, unspecified: Secondary | ICD-10-CM | POA: Diagnosis not present

## 2022-07-10 DIAGNOSIS — E782 Mixed hyperlipidemia: Secondary | ICD-10-CM

## 2022-07-10 DIAGNOSIS — Z9889 Other specified postprocedural states: Secondary | ICD-10-CM

## 2022-07-10 DIAGNOSIS — Z992 Dependence on renal dialysis: Secondary | ICD-10-CM

## 2022-07-10 DIAGNOSIS — I10 Essential (primary) hypertension: Secondary | ICD-10-CM | POA: Diagnosis not present

## 2022-07-10 DIAGNOSIS — Z9862 Peripheral vascular angioplasty status: Secondary | ICD-10-CM

## 2022-07-10 DIAGNOSIS — N186 End stage renal disease: Secondary | ICD-10-CM | POA: Diagnosis not present

## 2022-07-11 ENCOUNTER — Encounter (INDEPENDENT_AMBULATORY_CARE_PROVIDER_SITE_OTHER): Payer: Self-pay | Admitting: Nurse Practitioner

## 2022-07-11 NOTE — Progress Notes (Incomplete)
Subjective:    Patient ID: Oscar Essex., male    DOB: May 22, 1970, 52 y.o.   MRN: [id number] Chief Complaint  Patient presents with  . Follow-up    [facility] 3 week with HDA    HPI  Review of Systems     Objective:   Physical Exam  BP (!) 162/86 (BP Location: Left Arm)   Pulse 82   Resp 16   Wt 196 lb 3.2 oz (89 kg)   BMI 26.61 kg/m   Past Medical History:  Diagnosis Date  . Anxiety   . Arthritis   . Asthma    childhood asthma  . Chronic kidney disease   . Current every day smoker   . DVT (deep venous thrombosis) (HCC)   . Dyspnea    with exertion  . GERD (gastroesophageal reflux disease)   . Headache   . Hypercholesteremia   . Hypertension   . Peripheral vascular disease (HCC)     Social History   Socioeconomic History  . Marital status: Single    Spouse name: Not on file  . Number of children: Not on file  . Years of education: Not on file  . Highest education level: Not on file  Occupational History  . Not on file  Tobacco Use  . Smoking status: Every Day    Packs/day: .25    Types: Cigarettes  . Smokeless tobacco: Never  Vaping Use  . Vaping Use: Never used  Substance and Sexual Activity  . Alcohol use: No  . Drug use: Not Currently    Types: Marijuana    Comment: occassional (when out of pain medication)  . Sexual activity: Never  Other Topics Concern  . Not on file  Social History Narrative  . Not on file   Social Determinants of Health   Financial Resource Strain: Not on file  Food Insecurity: Not on file  Transportation Needs: Not on file  Physical Activity: Not on file  Stress: Not on file  Social Connections: Not on file  Intimate Partner Violence: Not on file    Past Surgical History:  Procedure Laterality Date  . A/V FISTULAGRAM Right 06/10/2022   Procedure: A/V Fistulagram;  Surgeon: Renford Dills, MD;  Location: [facility] INVASIVE CV LAB;  Service: Cardiovascular;  Laterality: Right;  . AV FISTULA PLACEMENT Right  12/26/2015   Procedure: ARTERIOVENOUS (AV) FISTULA CREATION ( RADIAL CEPHALIC );  Surgeon: Renford Dills, MD;  Location: [facility] ORS;  Service: Vascular;  Laterality: Right;  . FRACTURE SURGERY Left    screw in left wrist  . LOWER EXTREMITY ANGIOGRAPHY Right 07/30/2021   Procedure: Lower Extremity Angiography;  Surgeon: Renford Dills, MD;  Location: [facility] INVASIVE CV LAB;  Service: Cardiovascular;  Laterality: Right;  . PERIPHERAL VASCULAR CATHETERIZATION Right 03/28/2016   Procedure: A/V Fistulagram;  Surgeon: Renford Dills, MD;  Location: [facility] INVASIVE CV LAB;  Service: Cardiovascular;  Laterality: Right;  . PERIPHERAL VASCULAR CATHETERIZATION N/A 03/28/2016   Procedure: A/V Shunt Intervention;  Surgeon: Renford Dills, MD;  Location: [facility] INVASIVE CV LAB;  Service: Cardiovascular;  Laterality: N/A;  . VASCULAR SURGERY Left 07/2015   stent in left thigh, Location:[place]    Family History  Problem Relation Age of Onset  . Hypertension Mother   . Polycystic kidney disease Mother   . Hypertension Sister     Allergies  Allergen Reactions  . Iodinated Contrast Media Other (See Comments)    hypotension  . Oxycodone-Acetaminophen Itching  Latest Ref Rng & Units 07/02/2022   10:17 PM 03/22/2022    4:52 AM 03/21/2022    5:40 PM  CBC  WBC 4.0 - 10.5 K/uL 5.3  4.1  4.4   Hemoglobin 13.0 - 17.0 g/dL 16.1  09.6  04.5   Hematocrit 39.0 - 52.0 % 51.9  36.7  39.8   Platelets 150 - 400 K/uL 186  161  164       CMP     Component Value Date/Time   NA 133 (L) 07/02/2022 2217   K 4.2 07/02/2022 2217   CL 92 (L) 07/02/2022 2217   CO2 25 07/02/2022 2217   GLUCOSE 94 07/02/2022 2217   BUN 27 (H) 07/02/2022 2217   CREATININE 6.63 (H) 07/02/2022 2217   CALCIUM 10.3 07/02/2022 2217   PROT 7.4 07/02/2022 2217   ALBUMIN 4.4 07/02/2022 2217   AST 13 (L) 07/02/2022 2217   ALT 10 07/02/2022 2217   ALKPHOS 44 07/02/2022 2217   BILITOT 0.7 07/02/2022 2217   GFRNONAA 9 (L)  07/02/2022 2217   GFRAA 12 (L) 03/28/2016 0941     No results found.     Assessment & Plan:   1. ESRD on hemodialysis (HCC) ***  2. Mixed hyperlipidemia ***  3. Essential hypertension ***  4. Peripheral arterial disease with history of revascularization (HCC) ***   Current Outpatient Medications on File Prior to Visit  Medication Sig Dispense Refill  . amLODipine (NORVASC) 10 MG tablet Take 10 mg by mouth daily.    Marland Kitchen aspirin EC 81 MG tablet Take 1 tablet (81 mg total) by mouth daily. Swallow whole. 150 tablet 2  . calcium acetate (PHOSLO) 667 MG tablet Take 2,001 mg by mouth 3 (three) times daily.    . clopidogrel (PLAVIX) 75 MG tablet Take 1 tablet (75 mg total) by mouth daily. 30 tablet 11  . escitalopram (LEXAPRO) 10 MG tablet Take 10 mg by mouth daily.    . furosemide (LASIX) 80 MG tablet Take 80 mg by mouth 2 (two) times daily.    Marland Kitchen HYDROcodone-acetaminophen (NORCO) 10-325 MG tablet Take 1 tablet by mouth every 6 (six) hours.    Marland Kitchen lisinopril (PRINIVIL,ZESTRIL) 40 MG tablet Take 40 mg by mouth daily.    . methocarbamol (ROBAXIN) 750 MG tablet Take 750 mg by mouth 3 (three) times daily.    . midodrine (PROAMATINE) 10 MG tablet Take 1 tablet (10 mg total) by mouth daily as needed (Take 30 min prior to HD session). 30 tablet 0  . naloxone (NARCAN) nasal spray 4 mg/0.1 mL 1 spray as directed.    . pantoprazole (PROTONIX) 40 MG tablet Take 1 tablet (40 mg total) by mouth daily. 30 tablet 11  . simvastatin (ZOCOR) 10 MG tablet Take 10 mg by mouth daily.    . VELTASSA 8.4 g packet Take 8.4 g by mouth daily.    . VENTOLIN HFA 108 (90 Base) MCG/ACT inhaler Inhale 1-2 puffs into the lungs daily as needed for shortness of breath or wheezing.    . gabapentin (NEURONTIN) 100 MG capsule Take by mouth.     No current facility-administered medications on file prior to visit.    There are no Patient Instructions on file for this visit. No follow-ups on file.   Georgiana Spinner,  NP

## 2022-07-11 NOTE — Progress Notes (Signed)
Subjective:    Patient ID: Oscar Moody., male    DOB: June 11, 1970, 53 y.o.   MRN: [id number] Chief Complaint  Patient presents with   Follow-up    [facility] 3 week with HDA    The patient returns to the office for followup status post intervention of their dialysis access right brachiocephalic AV fistula.   Following the intervention the access function has significantly improved, with better flow rates and improved KT/V. The patient has not been experiencing increased bleeding times following decannulation and the patient denies increased recirculation. The patient denies an increase in arm swelling. At the present time the patient denies hand pain.  He has recently began noting some worsening claudication-like symptoms.  No history of rest pain symptoms. No new ulcers or wounds of the lower extremities have occurred.  The patient denies amaurosis fugax or recent TIA symptoms. There are no recent neurological changes noted. There is no history of DVT, PE or superficial thrombophlebitis. No recent episodes of angina or shortness of breath documented.   Duplex ultrasound of the AV access shows a patent access.  The previously noted stenosis is improved compared to last study.  Flow volume today is 3545 cc/min (previous flow volume was 2536 cc/min)       Review of Systems  Cardiovascular:  Negative for leg swelling.  Skin:  Negative for wound.  All other systems reviewed and are negative.      Objective:   Physical Exam Vitals reviewed.  HENT:     Head: Normocephalic.  Cardiovascular:     Rate and Rhythm: Normal rate.     Pulses:          Radial pulses are 2+ on the right side and 2+ on the left side.     Arteriovenous access: Right arteriovenous access is present.    Comments: Good thrill and bruit Pulmonary:     Effort: Pulmonary effort is normal.  Skin:    General: Skin is warm and dry.  Neurological:     Mental Status: He is alert and oriented to person, place, and  time.  Psychiatric:        Mood and Affect: Mood normal.        Behavior: Behavior normal.        Thought Content: Thought content normal.        Judgment: Judgment normal.     BP (!) 162/86 (BP Location: Left Arm)   Pulse 82   Resp 16   Wt 196 lb 3.2 oz (89 kg)   BMI 26.61 kg/m   Past Medical History:  Diagnosis Date   Anxiety    Arthritis    Asthma    childhood asthma   Chronic kidney disease    Current every day smoker    DVT (deep venous thrombosis) (HCC)    Dyspnea    with exertion   GERD (gastroesophageal reflux disease)    Headache    Hypercholesteremia    Hypertension    Peripheral vascular disease (HCC)     Social History   Socioeconomic History   Marital status: Single    Spouse name: Not on file   Number of children: Not on file   Years of education: Not on file   Highest education level: Not on file  Occupational History   Not on file  Tobacco Use   Smoking status: Every Day    Packs/day: .25    Types: Cigarettes   Smokeless tobacco: Never  Vaping Use   Vaping Use: Never used  Substance and Sexual Activity   Alcohol use: No   Drug use: Not Currently    Types: Marijuana    Comment: occassional (when out of pain medication)   Sexual activity: Never  Other Topics Concern   Not on file  Social History Narrative   Not on file   Social Determinants of Health   Financial Resource Strain: Not on file  Food Insecurity: Not on file  Transportation Needs: Not on file  Physical Activity: Not on file  Stress: Not on file  Social Connections: Not on file  Intimate Partner Violence: Not on file    Past Surgical History:  Procedure Laterality Date   A/V FISTULAGRAM Right 06/10/2022   Procedure: A/V Fistulagram;  Surgeon: Renford Dills, MD;  Location: [facility] INVASIVE CV LAB;  Service: Cardiovascular;  Laterality: Right;   AV FISTULA PLACEMENT Right 12/26/2015   Procedure: ARTERIOVENOUS (AV) FISTULA CREATION ( RADIAL CEPHALIC );  Surgeon:  Renford Dills, MD;  Location: [facility] ORS;  Service: Vascular;  Laterality: Right;   FRACTURE SURGERY Left    screw in left wrist   LOWER EXTREMITY ANGIOGRAPHY Right 07/30/2021   Procedure: Lower Extremity Angiography;  Surgeon: Renford Dills, MD;  Location: [facility] INVASIVE CV LAB;  Service: Cardiovascular;  Laterality: Right;   PERIPHERAL VASCULAR CATHETERIZATION Right 03/28/2016   Procedure: A/V Fistulagram;  Surgeon: Renford Dills, MD;  Location: [facility] INVASIVE CV LAB;  Service: Cardiovascular;  Laterality: Right;   PERIPHERAL VASCULAR CATHETERIZATION N/A 03/28/2016   Procedure: A/V Shunt Intervention;  Surgeon: Renford Dills, MD;  Location: [facility] INVASIVE CV LAB;  Service: Cardiovascular;  Laterality: N/A;   VASCULAR SURGERY Left 07/2015   stent in left thigh, Location:[place]    Family History  Problem Relation Age of Onset   Hypertension Mother    Polycystic kidney disease Mother    Hypertension Sister     Allergies  Allergen Reactions   Iodinated Contrast Media Other (See Comments)    hypotension   Oxycodone-Acetaminophen Itching       Latest Ref Rng & Units 07/02/2022   10:17 PM 03/22/2022    4:52 AM 03/21/2022    5:40 PM  CBC  WBC 4.0 - 10.5 K/uL 5.3  4.1  4.4   Hemoglobin 13.0 - 17.0 g/dL 16.1  09.6  04.5   Hematocrit 39.0 - 52.0 % 51.9  36.7  39.8   Platelets 150 - 400 K/uL 186  161  164       CMP     Component Value Date/Time   NA 133 (L) 07/02/2022 2217   K 4.2 07/02/2022 2217   CL 92 (L) 07/02/2022 2217   CO2 25 07/02/2022 2217   GLUCOSE 94 07/02/2022 2217   BUN 27 (H) 07/02/2022 2217   CREATININE 6.63 (H) 07/02/2022 2217   CALCIUM 10.3 07/02/2022 2217   PROT 7.4 07/02/2022 2217   ALBUMIN 4.4 07/02/2022 2217   AST 13 (L) 07/02/2022 2217   ALT 10 07/02/2022 2217   ALKPHOS 44 07/02/2022 2217   BILITOT 0.7 07/02/2022 2217   GFRNONAA 9 (L) 07/02/2022 2217   GFRAA 12 (L) 03/28/2016 0941     No results found.     Assessment & Plan:   1.  ESRD on hemodialysis [facility]) Recommend:  The patient is doing well and currently has adequate dialysis access. The patient's dialysis center is not reporting any access issues. Flow pattern is stable when compared  to the prior ultrasound.  The patient should have a duplex ultrasound of the dialysis access in 6 months. The patient will follow-up with me in the office after each ultrasound    2. Mixed hyperlipidemia Continue statin as ordered and reviewed, no changes at this time  3. Essential hypertension Continue antihypertensive medications as already ordered, these medications have been reviewed and there are no changes at this time.  4. Peripheral arterial disease with history of revascularization [facility]) The patient notes he has been having some worsening claudication-like symptoms.  His studies in January were near normal however the patient is also a smoker.  He notes that the symptoms are not significant at this time.  Following discussion he will follow-up with noninvasive studies in 6 months but if the claudication symptoms begin to worsen he is advised to contact our office for sooner evaluation   Current Outpatient Medications on File Prior to Visit  Medication Sig Dispense Refill   amLODipine (NORVASC) 10 MG tablet Take 10 mg by mouth daily.     aspirin EC 81 MG tablet Take 1 tablet (81 mg total) by mouth daily. Swallow whole. 150 tablet 2   calcium acetate (PHOSLO) 667 MG tablet Take 2,001 mg by mouth 3 (three) times daily.     clopidogrel (PLAVIX) 75 MG tablet Take 1 tablet (75 mg total) by mouth daily. 30 tablet 11   escitalopram (LEXAPRO) 10 MG tablet Take 10 mg by mouth daily.     furosemide (LASIX) 80 MG tablet Take 80 mg by mouth 2 (two) times daily.     HYDROcodone-acetaminophen (NORCO) 10-325 MG tablet Take 1 tablet by mouth every 6 (six) hours.     lisinopril (PRINIVIL,ZESTRIL) 40 MG tablet Take 40 mg by mouth daily.     methocarbamol (ROBAXIN) 750 MG tablet Take 750  mg by mouth 3 (three) times daily.     midodrine (PROAMATINE) 10 MG tablet Take 1 tablet (10 mg total) by mouth daily as needed (Take 30 min prior to HD session). 30 tablet 0   naloxone (NARCAN) nasal spray 4 mg/0.1 mL 1 spray as directed.     pantoprazole (PROTONIX) 40 MG tablet Take 1 tablet (40 mg total) by mouth daily. 30 tablet 11   simvastatin (ZOCOR) 10 MG tablet Take 10 mg by mouth daily.     VELTASSA 8.4 g packet Take 8.4 g by mouth daily.     VENTOLIN HFA 108 (90 Base) MCG/ACT inhaler Inhale 1-2 puffs into the lungs daily as needed for shortness of breath or wheezing.     gabapentin (NEURONTIN) 100 MG capsule Take by mouth.     No current facility-administered medications on file prior to visit.    There are no Patient Instructions on file for this visit. No follow-ups on file.   Georgiana Spinner, NP

## 2022-07-14 NOTE — Progress Notes (Signed)
07/15/2022 9:40 PM   Oscar Moody. Apr 26, 1970 [id number]  Referring provider: Jenell Milliner, MD [phone] [place],  [place] [address]  Urological history: 1.  Peyronie's disease -Contributing factors of a possible penile injury during intercourse several years ago, age, smoking and peripheral vascular disease -Had second cycle of Xiaflex 06/2022  2.  Erectile dysfunction -Contributing factors of age, Peyronie's disease, anxiety, asthma, ESRD, hypercholesterolemia, smoking (cigarettes and marijuana), peripheral vascular disease, coronary artery disease, congestive heart failure and COPD    No chief complaint on file.   HPI: Oscar Moody. is a 52 y.o. male who presents today for recheck on his penile hematomas.  When he presented to his Xiaflex modeling appointment.  He notified me that he was seen in the ED the night before his appointment.  He stated his penis was all bruised and he had a large hematoma that burst.  On exam he has penile edema with bruising of the entire penile shaft and and foreskin. The hematoma that opened during the night still has some scant blood, but no purulent discharge is noted.      PMH: Past Medical History:  Diagnosis Date   Anxiety    Arthritis    Asthma    childhood asthma   Chronic kidney disease    Current every day smoker    DVT (deep venous thrombosis) (HCC)    Dyspnea    with exertion   GERD (gastroesophageal reflux disease)    Headache    Hypercholesteremia    Hypertension    Peripheral vascular disease [facility])     Surgical History: Past Surgical History:  Procedure Laterality Date   A/V FISTULAGRAM Right 06/10/2022   Procedure: A/V Fistulagram;  Surgeon: Renford Dills, MD;  Location: [facility] INVASIVE CV LAB;  Service: Cardiovascular;  Laterality: Right;   AV FISTULA PLACEMENT Right 12/26/2015   Procedure: ARTERIOVENOUS (AV) FISTULA CREATION ( RADIAL CEPHALIC );  Surgeon: Renford Dills, MD;  Location:  [facility] ORS;  Service: Vascular;  Laterality: Right;   FRACTURE SURGERY Left    screw in left wrist   LOWER EXTREMITY ANGIOGRAPHY Right 07/30/2021   Procedure: Lower Extremity Angiography;  Surgeon: Renford Dills, MD;  Location: [facility] INVASIVE CV LAB;  Service: Cardiovascular;  Laterality: Right;   PERIPHERAL VASCULAR CATHETERIZATION Right 03/28/2016   Procedure: A/V Fistulagram;  Surgeon: Renford Dills, MD;  Location: [facility] INVASIVE CV LAB;  Service: Cardiovascular;  Laterality: Right;   PERIPHERAL VASCULAR CATHETERIZATION N/A 03/28/2016   Procedure: A/V Shunt Intervention;  Surgeon: Renford Dills, MD;  Location: [facility] INVASIVE CV LAB;  Service: Cardiovascular;  Laterality: N/A;   VASCULAR SURGERY Left 07/2015   stent in left thigh, Location:[place]    Home Medications:  Allergies as of 07/15/2022       Reactions   Iodinated Contrast Media Other (See Comments)   hypotension   Oxycodone-acetaminophen Itching        Medication List        Accurate as of Jul 14, 2022  9:40 PM. If you have any questions, ask your nurse or doctor.          amLODipine 10 MG tablet Commonly known as: NORVASC Take 10 mg by mouth daily.   aspirin EC 81 MG tablet Take 1 tablet (81 mg total) by mouth daily. Swallow whole.   calcium acetate 667 MG tablet Commonly known as: PHOSLO Take 2,001 mg by mouth 3 (three) times daily.  clopidogrel 75 MG tablet Commonly known as: Plavix Take 1 tablet (75 mg total) by mouth daily.   escitalopram 10 MG tablet Commonly known as: LEXAPRO Take 10 mg by mouth daily.   furosemide 80 MG tablet Commonly known as: LASIX Take 80 mg by mouth 2 (two) times daily.   gabapentin 100 MG capsule Commonly known as: NEURONTIN Take by mouth.   HYDROcodone-acetaminophen 10-325 MG tablet Commonly known as: NORCO Take 1 tablet by mouth every 6 (six) hours.   lisinopril 40 MG tablet Commonly known as: ZESTRIL Take 40 mg by mouth daily.   methocarbamol 750  MG tablet Commonly known as: ROBAXIN Take 750 mg by mouth 3 (three) times daily.   midodrine 10 MG tablet Commonly known as: PROAMATINE Take 1 tablet (10 mg total) by mouth daily as needed (Take 30 min prior to HD session).   naloxone 4 MG/0.1ML Liqd nasal spray kit Commonly known as: NARCAN 1 spray as directed.   pantoprazole 40 MG tablet Commonly known as: Protonix Take 1 tablet (40 mg total) by mouth daily.   simvastatin 10 MG tablet Commonly known as: ZOCOR Take 10 mg by mouth daily.   Veltassa 8.4 g packet Generic drug: patiromer Take 8.4 g by mouth daily.   Ventolin HFA 108 (90 Base) MCG/ACT inhaler Generic drug: albuterol Inhale 1-2 puffs into the lungs daily as needed for shortness of breath or wheezing.        Allergies:  Allergies  Allergen Reactions   Iodinated Contrast Media Other (See Comments)    hypotension   Oxycodone-Acetaminophen Itching    Family History: Family History  Problem Relation Age of Onset   Hypertension Mother    Polycystic kidney disease Mother    Hypertension Sister     Social History:  reports that he has been smoking cigarettes. He has been smoking an average of .25 packs per day. He has never used smokeless tobacco. He reports that he does not currently use drugs after having used the following drugs: Marijuana. He reports that he does not drink alcohol.  ROS: Pertinent ROS in HPI  Physical Exam: There were no vitals taken for this visit.  Constitutional:  Well nourished. Alert and oriented, No acute distress. HEENT:  AT, moist mucus membranes.  Trachea midline Cardiovascular: No clubbing, cyanosis, or edema. Respiratory: Normal respiratory effort, no increased work of breathing. GU: No CVA tenderness.  No bladder fullness or masses.  Patient with circumcised/uncircumcised phallus. ***Foreskin easily retracted***  Urethral meatus is patent.  No penile discharge. No penile lesions or rashes. Scrotum without lesions,  cysts, rashes and/or edema.  Testicles are located scrotally bilaterally. No masses are appreciated in the testicles. Left and right epididymis are normal. Rectal: Patient with  normal sphincter tone. Anus and perineum without scarring or rashes. No rectal masses are appreciated. Prostate is approximately *** grams, *** nodules are appreciated. Seminal vesicles are normal. Neurologic: Grossly intact, no focal deficits, moving all 4 extremities. Psychiatric: Normal mood and affect.   Laboratory Data: N/A  Pertinent Imaging: N/A   Assessment & Plan:    1.  Peyronie's disease -He is still with some bothersome curvature and does have a second dose available to him -He was hesitant to go through a second cycle, so we discussed using a vacuum erectile device to help treat the Peyronie's disease or referral to [facility] to discuss possible surgical correction, after some deliberation, he decided on the Xiaflex -He would like to proceed with a second Xiaflex cycle  2.  Erectile dysfunction -He states when he has erections, an area on the right below the coronal ridge does not seem to become firm -We will investigate this further when he presents for his induction of erection for plaque marking and photographs to prepare for Xiaflex injections  No follow-ups on file.  These notes generated with voice recognition software. I apologize for typographical errors.  Cloretta Ned  [facility] [address]  [address] [place], [place] [address] [phone]

## 2022-07-15 ENCOUNTER — Ambulatory Visit (INDEPENDENT_AMBULATORY_CARE_PROVIDER_SITE_OTHER): Payer: 59 | Admitting: Urology

## 2022-07-15 VITALS — BP 163/85 | HR 84

## 2022-07-15 DIAGNOSIS — N529 Male erectile dysfunction, unspecified: Secondary | ICD-10-CM | POA: Diagnosis not present

## 2022-07-15 DIAGNOSIS — Z87438 Personal history of other diseases of male genital organs: Secondary | ICD-10-CM

## 2022-07-15 DIAGNOSIS — N486 Induration penis plastica: Secondary | ICD-10-CM | POA: Diagnosis not present

## 2022-07-15 NOTE — Progress Notes (Signed)
Oscar Moody. presents for an office/procedure visit. BP today is 163/85. He is complaint with BP medication. Greater than 140/90. Provider  notified. Pt advised to follow up with PCP . Pt voiced understanding.

## 2022-09-02 ENCOUNTER — Ambulatory Visit (INDEPENDENT_AMBULATORY_CARE_PROVIDER_SITE_OTHER): Payer: 59 | Admitting: Urology

## 2022-09-02 VITALS — BP 153/81 | HR 93 | Ht 72.0 in | Wt 196.1 lb

## 2022-09-02 DIAGNOSIS — N486 Induration penis plastica: Secondary | ICD-10-CM | POA: Diagnosis not present

## 2022-09-02 NOTE — Progress Notes (Signed)
Virtual Visit via Video Note  I connected with Oscar Moody. on 09/02/22 at  1:45 PM EDT by a video enabled telemedicine application and verified that I am speaking with the correct person using two identifiers.  Location: Patient: office Provider: home   I discussed the limitations of evaluation and management by telemedicine and the availability of in person appointments. The patient expressed understanding and agreed to proceed.  History of Present Illness: 52 year-old male with a personal history of Peyronie's disease and erectile dysfunction who returns today for follow-up.  He's now undergone two cycles of Xiaflex. He saw Carollee Herter in March reporting that he still had 45 degrees of curvature. An induction of erection just prior to this visit seemed like his curvature is less significant although he had a more significant hourglass deformity. This cycle was complicated by hematoma. He denied any trauma or sudden loss of erections associated with this. His penile hematoma had resolved by the time he followed up with [employer] for modeling.  He reports that the hourglass deformity has significantly improved, with only a slight curvature to the left remaining. He feels that his condition has improved sufficiently for penetrative sex and does not wish to pursue further intervention. He also reports that the swelling and bruising from the recent cycle have resolved. He had an episode of swelling and bubbles post-treatment, for which he visited the ER, but it resolved with conservative management. He denies any current issues with erectile dysfunction and is able to achieve and maintain erections.   Observations/Objective: Patient appears well.  Assessment and Plan:  Peyronie's disease -He has some very slight curvature. Overall, he's very satisfied. He feels like it's a sufficient penetrative sex. He doesn't want to pursue any further intervention. He denies any further issues with  erectile dysfunction. -We will have him follow-up as needed    I discussed the assessment and treatment plan with the patient. The patient was provided an opportunity to ask questions and all were answered. The patient agreed with the plan and demonstrated an understanding of the instructions.   The patient was advised to call back or seek an in-person evaluation if the symptoms worsen or if the condition fails to improve as anticipated.  I provided 9 minutes of non-face-to-face time during this encounter.   I, DeAsia L Maxie,acting as a scribe for Vanna Scotland, MD.,have documented all relevant documentation on the behalf of Vanna Scotland, MD,as directed by  Vanna Scotland, MD while in the presence of Vanna Scotland, MD.   I have reviewed the above documentation for accuracy and completeness, and I agree with the above.   Vanna Scotland, MD

## 2022-09-05 ENCOUNTER — Other Ambulatory Visit: Payer: Self-pay

## 2022-09-05 ENCOUNTER — Emergency Department: Payer: 59

## 2022-09-05 DIAGNOSIS — Z79899 Other long term (current) drug therapy: Secondary | ICD-10-CM | POA: Diagnosis not present

## 2022-09-05 DIAGNOSIS — I5023 Acute on chronic systolic (congestive) heart failure: Secondary | ICD-10-CM | POA: Diagnosis not present

## 2022-09-05 DIAGNOSIS — Z992 Dependence on renal dialysis: Secondary | ICD-10-CM | POA: Insufficient documentation

## 2022-09-05 DIAGNOSIS — J45909 Unspecified asthma, uncomplicated: Secondary | ICD-10-CM | POA: Diagnosis not present

## 2022-09-05 DIAGNOSIS — Z7902 Long term (current) use of antithrombotics/antiplatelets: Secondary | ICD-10-CM | POA: Diagnosis not present

## 2022-09-05 DIAGNOSIS — Y92410 Unspecified street and highway as the place of occurrence of the external cause: Secondary | ICD-10-CM | POA: Diagnosis not present

## 2022-09-05 DIAGNOSIS — I2510 Atherosclerotic heart disease of native coronary artery without angina pectoris: Secondary | ICD-10-CM | POA: Diagnosis not present

## 2022-09-05 DIAGNOSIS — I132 Hypertensive heart and chronic kidney disease with heart failure and with stage 5 chronic kidney disease, or end stage renal disease: Secondary | ICD-10-CM | POA: Diagnosis not present

## 2022-09-05 DIAGNOSIS — N185 Chronic kidney disease, stage 5: Secondary | ICD-10-CM | POA: Diagnosis not present

## 2022-09-05 DIAGNOSIS — S39012A Strain of muscle, fascia and tendon of lower back, initial encounter: Secondary | ICD-10-CM | POA: Diagnosis not present

## 2022-09-05 DIAGNOSIS — M545 Low back pain, unspecified: Secondary | ICD-10-CM | POA: Diagnosis present
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
## 2022-09-06 ENCOUNTER — Emergency Department
Admission: EM | Admit: 2022-09-06 | Discharge: 2022-09-06 | Disposition: A | Discharge status: Home / Self Care | Payer: 59 | Attending: Emergency Medicine | Admitting: Emergency Medicine

## 2022-09-06 DIAGNOSIS — S39012A Strain of muscle, fascia and tendon of lower back, initial encounter: Secondary | ICD-10-CM | POA: Diagnosis not present

## 2022-09-06 MED ORDER — LIDOCAINE 5 % EX PTCH: 1.0000 | MEDICATED_PATCH | CUTANEOUS | 0 refills | Status: AC

## 2022-09-06 MED ORDER — LIDOCAINE 5 % EX PTCH
1.0000 | MEDICATED_PATCH | CUTANEOUS | Status: DC
  Administered 2022-09-06: 1 via TRANSDERMAL
  Filled 2022-09-06: qty 1
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
## 2022-09-06 NOTE — ED Notes (Signed)
Discharge instructions provided by edp were discussed with pt. Pt verbalized understanding with no additional questions at this time.  

## 2022-09-06 NOTE — Discharge Instructions (Signed)
You may continue to take your pain medicines as directed by your doctor.  Apply Lidocaine patch as directed for pain.  Return to the ER for worsening symptoms, persistent vomiting, extremity weakness/numbness/tingling or other concerns.

## 2022-10-20 ENCOUNTER — Telehealth (INDEPENDENT_AMBULATORY_CARE_PROVIDER_SITE_OTHER): Payer: Self-pay

## 2022-10-20 NOTE — Telephone Encounter (Signed)
He should come in with arterial duplex of that leg, but if he feels like he needs emergent attention he should head to the ED

## 2022-10-20 NOTE — Telephone Encounter (Signed)
Patient left a stating that his foot keep going to sleep and he can't walk. He feels like "he needs emergency surgery." I called to get more details, but got the voicemail.

## 2022-10-21 NOTE — Telephone Encounter (Signed)
Patient decided to come in for an appt. Front desk is calling him to make an appt.

## 2022-10-22 ENCOUNTER — Telehealth (INDEPENDENT_AMBULATORY_CARE_PROVIDER_SITE_OTHER): Payer: Self-pay

## 2022-10-22 NOTE — Telephone Encounter (Signed)
Patient called stating he is having extremely worse pain in his legs, his foot is numb even when he is lying down and he believes he has another blood clot. Front office has been attempting to contact the patient to schedule him to come in for an ultrasound since  Monday when he called about pain. Patient was advised that if he is in that much pain to go to the ED to be evaluated and he refused. Patient was told today that if he is in that much pain to be evaluated at the ED, I explained also that a message would be sent to the NP Sheppard Plumber regarding this as well.  The front office has attempted to contact the patient to have him come in for an ultrasound.

## 2022-10-22 NOTE — Telephone Encounter (Signed)
F/u   Call  [phone] left a voicemail to call the office to schedule arterial duplex per Sheppard Plumber NP

## 2023-01-09 ENCOUNTER — Other Ambulatory Visit (INDEPENDENT_AMBULATORY_CARE_PROVIDER_SITE_OTHER): Payer: Self-pay | Admitting: Nurse Practitioner

## 2023-01-09 DIAGNOSIS — Z9889 Other specified postprocedural states: Secondary | ICD-10-CM

## 2023-01-09 DIAGNOSIS — N186 End stage renal disease: Secondary | ICD-10-CM

## 2023-01-11 ENCOUNTER — Other Ambulatory Visit: Payer: Self-pay

## 2023-01-11 ENCOUNTER — Inpatient Hospital Stay: Payer: 59

## 2023-01-11 ENCOUNTER — Inpatient Hospital Stay
Admission: EM | Admit: 2023-01-11 | Discharge: 2023-01-14 | DRG: 270 | Disposition: A | Discharge status: Home / Self Care | Payer: 59 | Attending: Internal Medicine | Admitting: Internal Medicine

## 2023-01-11 ENCOUNTER — Emergency Department: Payer: 59

## 2023-01-11 DIAGNOSIS — N186 End stage renal disease: Secondary | ICD-10-CM

## 2023-01-11 DIAGNOSIS — G894 Chronic pain syndrome: Secondary | ICD-10-CM | POA: Diagnosis present

## 2023-01-11 DIAGNOSIS — Z8679 Personal history of other diseases of the circulatory system: Secondary | ICD-10-CM | POA: Diagnosis not present

## 2023-01-11 DIAGNOSIS — Z9889 Other specified postprocedural states: Secondary | ICD-10-CM

## 2023-01-11 DIAGNOSIS — Y831 Surgical operation with implant of artificial internal device as the cause of abnormal reaction of the patient, or of later complication, without mention of misadventure at the time of the procedure: Secondary | ICD-10-CM | POA: Diagnosis present

## 2023-01-11 DIAGNOSIS — T82856A Stenosis of peripheral vascular stent, initial encounter: Secondary | ICD-10-CM | POA: Diagnosis present

## 2023-01-11 DIAGNOSIS — I70202 Unspecified atherosclerosis of native arteries of extremities, left leg: Secondary | ICD-10-CM | POA: Diagnosis present

## 2023-01-11 DIAGNOSIS — I708 Atherosclerosis of other arteries: Secondary | ICD-10-CM | POA: Diagnosis present

## 2023-01-11 DIAGNOSIS — Z86718 Personal history of other venous thrombosis and embolism: Secondary | ICD-10-CM

## 2023-01-11 DIAGNOSIS — I739 Peripheral vascular disease, unspecified: Principal | ICD-10-CM | POA: Diagnosis present

## 2023-01-11 DIAGNOSIS — Z885 Allergy status to narcotic agent status: Secondary | ICD-10-CM | POA: Diagnosis not present

## 2023-01-11 DIAGNOSIS — E871 Hypo-osmolality and hyponatremia: Secondary | ICD-10-CM | POA: Diagnosis present

## 2023-01-11 DIAGNOSIS — Z87448 Personal history of other diseases of urinary system: Secondary | ICD-10-CM | POA: Diagnosis not present

## 2023-01-11 DIAGNOSIS — Z79899 Other long term (current) drug therapy: Secondary | ICD-10-CM | POA: Diagnosis not present

## 2023-01-11 DIAGNOSIS — Z7902 Long term (current) use of antithrombotics/antiplatelets: Secondary | ICD-10-CM

## 2023-01-11 DIAGNOSIS — J45909 Unspecified asthma, uncomplicated: Secondary | ICD-10-CM | POA: Diagnosis present

## 2023-01-11 DIAGNOSIS — M79604 Pain in right leg: Secondary | ICD-10-CM | POA: Diagnosis not present

## 2023-01-11 DIAGNOSIS — I9581 Postprocedural hypotension: Secondary | ICD-10-CM | POA: Diagnosis not present

## 2023-01-11 DIAGNOSIS — Z91041 Radiographic dye allergy status: Secondary | ICD-10-CM

## 2023-01-11 DIAGNOSIS — I953 Hypotension of hemodialysis: Secondary | ICD-10-CM | POA: Diagnosis not present

## 2023-01-11 DIAGNOSIS — F17210 Nicotine dependence, cigarettes, uncomplicated: Secondary | ICD-10-CM | POA: Diagnosis present

## 2023-01-11 DIAGNOSIS — F17200 Nicotine dependence, unspecified, uncomplicated: Secondary | ICD-10-CM | POA: Diagnosis present

## 2023-01-11 DIAGNOSIS — K219 Gastro-esophageal reflux disease without esophagitis: Secondary | ICD-10-CM | POA: Diagnosis present

## 2023-01-11 DIAGNOSIS — Q613 Polycystic kidney, unspecified: Secondary | ICD-10-CM

## 2023-01-11 DIAGNOSIS — E782 Mixed hyperlipidemia: Secondary | ICD-10-CM | POA: Diagnosis not present

## 2023-01-11 DIAGNOSIS — N2581 Secondary hyperparathyroidism of renal origin: Secondary | ICD-10-CM | POA: Diagnosis present

## 2023-01-11 DIAGNOSIS — Z87718 Personal history of other specified (corrected) congenital malformations of genitourinary system: Secondary | ICD-10-CM

## 2023-01-11 DIAGNOSIS — N19 Unspecified kidney failure: Secondary | ICD-10-CM | POA: Diagnosis not present

## 2023-01-11 DIAGNOSIS — I700 Atherosclerosis of aorta: Secondary | ICD-10-CM | POA: Diagnosis not present

## 2023-01-11 DIAGNOSIS — Z992 Dependence on renal dialysis: Secondary | ICD-10-CM | POA: Diagnosis not present

## 2023-01-11 DIAGNOSIS — I743 Embolism and thrombosis of arteries of the lower extremities: Secondary | ICD-10-CM | POA: Diagnosis not present

## 2023-01-11 DIAGNOSIS — Z01818 Encounter for other preprocedural examination: Secondary | ICD-10-CM

## 2023-01-11 DIAGNOSIS — F411 Generalized anxiety disorder: Secondary | ICD-10-CM | POA: Diagnosis present

## 2023-01-11 DIAGNOSIS — Y712 Prosthetic and other implants, materials and accessory cardiovascular devices associated with adverse incidents: Secondary | ICD-10-CM | POA: Diagnosis present

## 2023-01-11 DIAGNOSIS — Z8271 Family history of polycystic kidney: Secondary | ICD-10-CM

## 2023-01-11 DIAGNOSIS — E7800 Pure hypercholesterolemia, unspecified: Secondary | ICD-10-CM | POA: Diagnosis present

## 2023-01-11 DIAGNOSIS — D631 Anemia in chronic kidney disease: Secondary | ICD-10-CM | POA: Diagnosis present

## 2023-01-11 DIAGNOSIS — I120 Hypertensive chronic kidney disease with stage 5 chronic kidney disease or end stage renal disease: Secondary | ICD-10-CM | POA: Diagnosis present

## 2023-01-11 DIAGNOSIS — F32A Depression, unspecified: Secondary | ICD-10-CM | POA: Diagnosis present

## 2023-01-11 DIAGNOSIS — I2510 Atherosclerotic heart disease of native coronary artery without angina pectoris: Secondary | ICD-10-CM | POA: Diagnosis present

## 2023-01-11 DIAGNOSIS — I25119 Atherosclerotic heart disease of native coronary artery with unspecified angina pectoris: Secondary | ICD-10-CM | POA: Diagnosis not present

## 2023-01-11 DIAGNOSIS — Z8709 Personal history of other diseases of the respiratory system: Secondary | ICD-10-CM

## 2023-01-11 DIAGNOSIS — T82868A Thrombosis of vascular prosthetic devices, implants and grafts, initial encounter: Secondary | ICD-10-CM | POA: Diagnosis not present

## 2023-01-11 DIAGNOSIS — Z716 Tobacco abuse counseling: Secondary | ICD-10-CM

## 2023-01-11 DIAGNOSIS — I70221 Atherosclerosis of native arteries of extremities with rest pain, right leg: Secondary | ICD-10-CM | POA: Diagnosis present

## 2023-01-11 DIAGNOSIS — E785 Hyperlipidemia, unspecified: Secondary | ICD-10-CM | POA: Diagnosis present

## 2023-01-11 DIAGNOSIS — Z8249 Family history of ischemic heart disease and other diseases of the circulatory system: Secondary | ICD-10-CM

## 2023-01-11 DIAGNOSIS — I10 Essential (primary) hypertension: Secondary | ICD-10-CM | POA: Diagnosis not present

## 2023-01-11 DIAGNOSIS — Z7982 Long term (current) use of aspirin: Secondary | ICD-10-CM

## 2023-01-11 LAB — CBC WITH DIFFERENTIAL/PLATELET
Abs Immature Granulocytes: 0.04 10*3/uL (ref 0.00–0.07)
Basophils Absolute: 0.1 10*3/uL (ref 0.0–0.1)
Basophils Relative: 1 %
Eosinophils Absolute: 0.1 10*3/uL (ref 0.0–0.5)
Eosinophils Relative: 2 %
HCT: 46.1 % (ref 39.0–52.0)
Hemoglobin: 14.5 g/dL (ref 13.0–17.0)
Immature Granulocytes: 1 %
Lymphocytes Relative: 17 %
Lymphs Abs: 1.3 10*3/uL (ref 0.7–4.0)
MCH: 29.1 pg (ref 26.0–34.0)
MCHC: 31.5 g/dL (ref 30.0–36.0)
MCV: 92.4 fL (ref 80.0–100.0)
Monocytes Absolute: 0.5 10*3/uL (ref 0.1–1.0)
Monocytes Relative: 7 %
Neutro Abs: 5.4 10*3/uL (ref 1.7–7.7)
Neutrophils Relative %: 72 %
Platelets: 205 10*3/uL (ref 150–400)
RBC: 4.99 MIL/uL (ref 4.22–5.81)
RDW: 14 % (ref 11.5–15.5)
WBC: 7.4 10*3/uL (ref 4.0–10.5)
nRBC: 0 % (ref 0.0–0.2)

## 2023-01-11 LAB — BASIC METABOLIC PANEL
Anion gap: 17 — ABNORMAL HIGH (ref 5–15)
BUN: 39 mg/dL — ABNORMAL HIGH (ref 6–20)
CO2: 26 mmol/L (ref 22–32)
Calcium: 9.4 mg/dL (ref 8.9–10.3)
Chloride: 90 mmol/L — ABNORMAL LOW (ref 98–111)
Creatinine, Ser: 8.32 mg/dL — ABNORMAL HIGH (ref 0.61–1.24)
GFR, Estimated: 7 mL/min — ABNORMAL LOW (ref >60)
Glucose, Bld: 137 mg/dL — ABNORMAL HIGH (ref 70–99)
Potassium: 4.1 mmol/L (ref 3.5–5.1)
Sodium: 133 mmol/L — ABNORMAL LOW (ref 135–145)

## 2023-01-11 LAB — PROTIME-INR
INR: 1.1 (ref 0.8–1.2)
Prothrombin Time: 14.2 s (ref 11.4–15.2)

## 2023-01-11 MED ORDER — HYDRALAZINE HCL 20 MG/ML IJ SOLN: 5.0000 mg | Freq: Four times a day (QID) | INTRAMUSCULAR | Status: DC | PRN

## 2023-01-11 MED ORDER — TRAMADOL HCL 50 MG PO TABS
50.0000 mg | ORAL_TABLET | Freq: Four times a day (QID) | ORAL | Status: AC | PRN
  Administered 2023-01-12: 50 mg via ORAL
  Filled 2023-01-11: qty 1

## 2023-01-11 MED ORDER — ACETAMINOPHEN 650 MG RE SUPP: 650.0000 mg | Freq: Four times a day (QID) | RECTAL | Status: DC | PRN

## 2023-01-11 MED ORDER — HEPARIN SODIUM (PORCINE) 5000 UNIT/ML IJ SOLN
5000.0000 [IU] | Freq: Three times a day (TID) | INTRAMUSCULAR | Status: DC
  Administered 2023-01-11 – 2023-01-14 (×5): 5000 [IU] via SUBCUTANEOUS
  Filled 2023-01-11 (×5): qty 1

## 2023-01-11 MED ORDER — AMLODIPINE BESYLATE 10 MG PO TABS
10.0000 mg | ORAL_TABLET | Freq: Every day | ORAL | Status: DC
  Administered 2023-01-12 – 2023-01-14 (×2): 10 mg via ORAL
  Filled 2023-01-11 (×2): qty 1

## 2023-01-11 MED ORDER — ONDANSETRON HCL 4 MG/2ML IJ SOLN: 4.0000 mg | Freq: Four times a day (QID) | INTRAMUSCULAR | Status: DC | PRN

## 2023-01-11 MED ORDER — MIDODRINE HCL 5 MG PO TABS: 10.0000 mg | ORAL_TABLET | Freq: Every day | ORAL | Status: DC | PRN

## 2023-01-11 MED ORDER — NICOTINE 21 MG/24HR TD PT24
21.0000 mg | MEDICATED_PATCH | Freq: Every day | TRANSDERMAL | Status: DC | PRN
  Administered 2023-01-12 – 2023-01-13 (×2): 21 mg via TRANSDERMAL
  Filled 2023-01-11 (×2): qty 1

## 2023-01-11 MED ORDER — SIMVASTATIN 20 MG PO TABS
10.0000 mg | ORAL_TABLET | Freq: Every day | ORAL | Status: DC
  Administered 2023-01-12 – 2023-01-14 (×2): 10 mg via ORAL
  Filled 2023-01-11 (×2): qty 1

## 2023-01-11 MED ORDER — SENNOSIDES-DOCUSATE SODIUM 8.6-50 MG PO TABS: 1.0000 | ORAL_TABLET | Freq: Every evening | ORAL | Status: DC | PRN

## 2023-01-11 MED ORDER — CHLORHEXIDINE GLUCONATE CLOTH 2 % EX PADS
6.0000 | MEDICATED_PAD | Freq: Every day | CUTANEOUS | Status: DC
  Administered 2023-01-13 – 2023-01-14 (×2): 6 via TOPICAL
  Filled 2023-01-11: qty 6

## 2023-01-11 MED ORDER — ACETAMINOPHEN 325 MG PO TABS
650.0000 mg | ORAL_TABLET | Freq: Four times a day (QID) | ORAL | Status: DC | PRN
  Administered 2023-01-12 – 2023-01-14 (×3): 650 mg via ORAL
  Filled 2023-01-11 (×2): qty 2

## 2023-01-11 MED ORDER — PANTOPRAZOLE SODIUM 40 MG PO TBEC
40.0000 mg | DELAYED_RELEASE_TABLET | Freq: Every day | ORAL | Status: DC
  Administered 2023-01-12 – 2023-01-14 (×2): 40 mg via ORAL
  Filled 2023-01-11 (×2): qty 1

## 2023-01-11 MED ORDER — ONDANSETRON HCL 4 MG PO TABS: 4.0000 mg | ORAL_TABLET | Freq: Four times a day (QID) | ORAL | Status: DC | PRN

## 2023-01-11 MED ORDER — FUROSEMIDE 40 MG PO TABS
80.0000 mg | ORAL_TABLET | Freq: Two times a day (BID) | ORAL | Status: DC
  Administered 2023-01-11 – 2023-01-14 (×5): 80 mg via ORAL
  Filled 2023-01-11 (×5): qty 2

## 2023-01-11 MED ORDER — FENTANYL CITRATE PF 50 MCG/ML IJ SOSY: 12.5000 ug | PREFILLED_SYRINGE | INTRAMUSCULAR | Status: AC | PRN

## 2023-01-11 NOTE — ED Triage Notes (Addendum)
Pt to ED via POV from home. Pt reports right leg pain. Pt reports hx of blood clots and feels same. Pt is on blood thinner but hasn't taken it in a couple of days.

## 2023-01-11 NOTE — Assessment & Plan Note (Addendum)
As needed nicotine patch ordered Patient currently taking Chantix at home Patient endorses readiness to stop tobacco use. Greater than 3 minutes spent counseling patient on tobacco cessation  Tobacco cessation counseling:  Discuss with PCP for continuing pharmacologic assistance with smoking cessation Clean all indoor clothing, sheets, blankets, and freshen textile furniture to rid the smell of cigarettes Only smoke outside and wear outer covering Leave cigarettes and lighters outside in separate places During in-between cigarettes, if you feel the urge to smoke, use the following: stress squeezing devices/phone a trusted friend to talk you through the urge/walk in a safe environment Avoid prolong interactions with individuals actively smoking cigarettes If you smoke in social setting, avoid social setting where cigarette smoking is expected or considered acceptable

## 2023-01-11 NOTE — Assessment & Plan Note (Signed)
Continue outpatient follow-up as appropriate Recommend cessation of tobacco use

## 2023-01-11 NOTE — H&P (Signed)
History and Physical   [address] Oscar Moody. ZOX:[id number] DOB: 1970-06-06 DOA: 01/11/2023  PCP: Jenell Milliner, MD  Outpatient Specialists: Dr. Vanna Scotland, urology Patient coming from: Home via POV  I have personally briefly reviewed patient's old medical records in [facility] EMR.  Chief Concern: Right leg pain  HPI: Mr. Oscar Moody (is a 52 year old male with history of hypertension, GERD, hyperlipidemia, ESRD on HD MWF, history of PAD status post right lower extremity revascularization in June 2023, who presents to the emergency department for chief concerns of right leg pain.  Vitals in the ED showed temperature of 98, respiration rate of 18, heart rate of 86, blood pressure 161/98, SpO2 98% on room air.  Serum sodium is 133, potassium 4.1, chloride 90, bicarb 26, BUN of 39, serum creatinine of 8.32, EGFR of 7, nonfasting glucose 137, WBC 7.4, hemoglobin 14.5, platelets of 205.  EDP consulted with vascular, Dr. Evie Lacks recommends getting in ABI of the lower extremities.  ED treatment: None.  ABI of the lower extremity was ordered by EDP. ------------------------------ At bedside, patient able to tell me his name, age, location, current calendar year.  He reports his right leg has been hurting him for the past 3 to 4 weeks.  He reports initially was just hurting his calf when he walks.  This pain gradually worsened until his toes started getting red therefore he stopped taking his Plavix in anticipation of coming to the hospital for vascular surgery.  He denies fever, trauma to his person, chest pain, shortness of breath, recent car trips.  He reports this feels similar to his prior episodes of PAD requiring vascular intervention.  Social history: He lives at home with his wife and son. He currently smokes occasionally due to recent stressors. His 2 sisters and father recently passed away and then he started smoking cigarettes again. He denies EtOH and recreational drug use.He is on  disability.   ROS: Constitutional: no weight change, no fever ENT/Mouth: no sore throat, no rhinorrhea Eyes: no eye pain, no vision changes Cardiovascular: no chest pain, no dyspnea,  no edema, no palpitations Respiratory: no cough, no sputum, no wheezing Gastrointestinal: no nausea, no vomiting, no diarrhea, no constipation Genitourinary: no urinary incontinence, no dysuria, no hematuria Musculoskeletal: no arthralgias, no myalgias, + calf pain with exertion, walking Skin: no skin lesions, no pruritus, Neuro: + weakness, no loss of consciousness, no syncope Psych: no anxiety, no depression, + decrease appetite Heme/Lymph: no bruising, no bleeding  ED Course: Discussed with EDP, patient requiring hospitalization for chief concerns of right leg pain secondary to PAD.  Assessment/Plan  Principal Problem:   PAD (peripheral artery disease) (HCC) Active Problems:   ESRD on dialysis (HCC)   Coronary artery disease   Hyperlipidemia   Essential hypertension   Anxiety state   History of polycystic kidney disease   Polycystic kidney, unspecified   Pre-transplant evaluation for CKD (chronic kidney disease)   Tobacco use disorder   GERD without esophagitis   Depression   Hypotension after procedure   Assessment and Plan:  * PAD (peripheral artery disease) (HCC) With right leg pain Not in ischemic limb at this time Vascular has been consulted for revascularization and requesting ABI Aspirin and Plavix held on admission in anticipation of procedure, a.m. team to resume when the benefits outweigh the risk Patient last took his Plavix on 11/14 ABI has been ordered by EDP Symptomatic support: Tramadol 50 mg every 6 hours as needed for moderate pain, 2 days of coverage  ordered; fentanyl 12.5 mcg IV every 4 hours as needed for severe pain, 20 hours of coverage ordered AM team to reevaluate patient at bedside for continued opioid pain requirements Admit to telemetry cardiac,  inpatient  ESRD on dialysis [facility]) Nephrology has been consulted via secure chat and epic order for continuation of hemodialysis  Hyperlipidemia Simvastatin 10 mg daily resumed  Essential hypertension Home amlodipine 10 mg daily, furosemide 80 mg p.o. twice daily resumed on admission Hydralazine 5 mg IV every 6 hours as needed for SBP greater 165, 4 days ordered  Hypotension after procedure Patient gets hypotensive during/towards the end of hemodialysis Midodrine 10 mg daily as needed, patient to take 30 minutes prior to hemodialysis sessions resumed  Depression Patient states he stopped taking his antidepression medication  Tobacco use disorder As needed nicotine patch ordered Patient currently taking Chantix at home Patient endorses readiness to stop tobacco use. Greater than 3 minutes spent counseling patient on tobacco cessation  Tobacco cessation counseling:  Discuss with PCP for continuing pharmacologic assistance with smoking cessation Clean all indoor clothing, sheets, blankets, and freshen textile furniture to rid the smell of cigarettes Only smoke outside and wear outer covering Leave cigarettes and lighters outside in separate places During in-between cigarettes, if you feel the urge to smoke, use the following: stress squeezing devices/phone a trusted friend to talk you through the urge/walk in a safe environment Avoid prolong interactions with individuals actively smoking cigarettes If you smoke in social setting, avoid social setting where cigarette smoking is expected or considered acceptable   Pre-transplant evaluation for CKD (chronic kidney disease) Continue outpatient follow-up as appropriate Recommend cessation of tobacco use  Chart reviewed.   DVT prophylaxis: Heparin 5000 units subcutaneous every 8 hours Code Status: Full code Diet: Renal diet with fluid restriction Family Communication: No Disposition Plan: Pending vascular evaluation Consults  called: Vascular per EDP Admission status: Telemetry cardiac, inpatient  Past Medical History:  Diagnosis Date   Anxiety    Arthritis    Asthma    childhood asthma   Chronic kidney disease    Current every day smoker    DVT (deep venous thrombosis) (HCC)    Dyspnea    with exertion   GERD (gastroesophageal reflux disease)    Headache    Hypercholesteremia    Hypertension    Peripheral vascular disease (HCC)    Past Surgical History:  Procedure Laterality Date   A/V FISTULAGRAM Right 06/10/2022   Procedure: A/V Fistulagram;  Surgeon: Renford Dills, MD;  Location: [facility] INVASIVE CV LAB;  Service: Cardiovascular;  Laterality: Right;   AV FISTULA PLACEMENT Right 12/26/2015   Procedure: ARTERIOVENOUS (AV) FISTULA CREATION ( RADIAL CEPHALIC );  Surgeon: Renford Dills, MD;  Location: [facility] ORS;  Service: Vascular;  Laterality: Right;   FRACTURE SURGERY Left    screw in left wrist   LOWER EXTREMITY ANGIOGRAPHY Right 07/30/2021   Procedure: Lower Extremity Angiography;  Surgeon: Renford Dills, MD;  Location: [facility] INVASIVE CV LAB;  Service: Cardiovascular;  Laterality: Right;   PERIPHERAL VASCULAR CATHETERIZATION Right 03/28/2016   Procedure: A/V Fistulagram;  Surgeon: Renford Dills, MD;  Location: [facility] INVASIVE CV LAB;  Service: Cardiovascular;  Laterality: Right;   PERIPHERAL VASCULAR CATHETERIZATION N/A 03/28/2016   Procedure: A/V Shunt Intervention;  Surgeon: Renford Dills, MD;  Location: [facility] INVASIVE CV LAB;  Service: Cardiovascular;  Laterality: N/A;   VASCULAR SURGERY Left 07/2015   stent in left thigh, Location:[place]   Social History:  reports that he has been smoking cigarettes. He has never used smokeless tobacco. He reports that he does not currently use drugs after having used the following drugs: Marijuana. He reports that he does not drink alcohol.  Allergies  Allergen Reactions   Iodinated Contrast Media Other (See Comments)    hypotension    Oxycodone-Acetaminophen Itching   Family History  Problem Relation Age of Onset   Hypertension Mother    Polycystic kidney disease Mother    Hypertension Sister    Family history: Family history reviewed and not pertinent.  Prior to Admission medications   Medication Sig Start Date End Date Taking? Authorizing Provider  amLODipine (NORVASC) 10 MG tablet Take 10 mg by mouth daily. 09/14/18   [provider]  calcium acetate (PHOSLO) 667 MG tablet Take 2,001 mg by mouth 3 (three) times daily. 01/11/22   [provider]  clopidogrel (PLAVIX) 75 MG tablet Take 1 tablet (75 mg total) by mouth daily. 06/11/22   Schnier, Latina Craver, MD  escitalopram (LEXAPRO) 10 MG tablet Take 10 mg by mouth daily. 03/14/22   [provider]  furosemide (LASIX) 80 MG tablet Take 80 mg by mouth 2 (two) times daily.    [provider]  gabapentin (NEURONTIN) 100 MG capsule Take by mouth. 01/07/22 09/02/22  [provider]  HYDROcodone-acetaminophen (NORCO) 10-325 MG tablet Take 1 tablet by mouth every 6 (six) hours. 01/29/22   [provider]  lidocaine (LIDODERM) 5 % Place 1 patch onto the skin daily. Remove & Discard patch within 12 hours or as directed by MD 09/06/22   Irean Hong, MD  lisinopril (PRINIVIL,ZESTRIL) 40 MG tablet Take 40 mg by mouth daily.    [provider]  methocarbamol (ROBAXIN) 750 MG tablet Take 750 mg by mouth 3 (three) times daily. 01/07/22   [provider]  midodrine (PROAMATINE) 10 MG tablet Take 1 tablet (10 mg total) by mouth daily as needed (Take 30 min prior to HD session). 03/22/22   Sreenath, Jonelle Sports, MD  naloxone [employer]) nasal spray 4 mg/0.1 mL 1 spray as directed. 10/03/20   [provider]  pantoprazole (PROTONIX) 40 MG tablet Take 1 tablet (40 mg total) by mouth daily. 06/10/22 06/10/23  Schnier, Latina Craver, MD  RABEprazole (ACIPHEX) 20 MG tablet Take 20 mg by mouth daily. 06/27/22   [provider]   simvastatin (ZOCOR) 10 MG tablet Take 10 mg by mouth daily. 02/12/21   [provider]  VELTASSA 8.4 g packet Take 8.4 g by mouth daily. 11/29/21   [provider]  VENTOLIN HFA 108 (90 Base) MCG/ACT inhaler Inhale 1-2 puffs into the lungs daily as needed for shortness of breath or wheezing. 03/14/22   [provider]   Physical Exam: Vitals:   01/11/23 1103 01/11/23 1123 01/11/23 1425  BP: (!) 161/98  (!) 158/90  Pulse: 86  69  Resp: 18  20  Temp: 98 F (36.7 C)  98 F (36.7 C)  TempSrc: Oral  Oral  SpO2: 98%  98%  Weight:  88 kg   Height:  6' (1.829 m)    Constitutional: appears age-appropriate, NAD, calm Eyes: PERRL, lids and conjunctivae normal ENMT: Mucous membranes are moist. Posterior pharynx clear of any exudate or lesions. Age-appropriate dentition. Hearing appropriate Neck: normal, supple, no masses, no thyromegaly Respiratory: clear to auscultation bilaterally, no wheezing, no crackles. Normal respiratory effort. No accessory muscle use.  Cardiovascular: Regular rate and rhythm, no murmurs / rubs /  gallops. No extremity edema. 2+ pedal pulses appreciated on the left lower extremity.  Pulses difficult to palpate on the right lower extremity.. No carotid bruits.  Abdomen: Obese abdomen, no tenderness, no masses palpated, no hepatosplenomegaly. Bowel sounds positive.  Musculoskeletal: no clubbing / cyanosis. No joint deformity upper and lower extremities. Good ROM, no contractures, no atrophy. Normal muscle tone.  Skin: no rashes, lesions, ulcers. No induration.  Right toes with increased redness and tenderness with flexion extension Neurologic: Sensation intact. Strength 5/5 in all 4.  Psychiatric: Normal judgment and insight. Alert and oriented x 3. Normal mood.   EKG: Not indicated at this time  Chest x-ray on Admission: Not indicated at this time  US ARTERIAL ABI (SCREENING LOWER EXTREMITY)  Result Date: 01/11/2023 CLINICAL DATA:  Right calf  pain and right-greater-than-left bilateral foot numbness EXAM: NONINVASIVE PHYSIOLOGIC VASCULAR STUDY OF BILATERAL LOWER EXTREMITIES TECHNIQUE: Evaluation of both lower extremities were performed at rest, including calculation of ankle-brachial indices with single level Doppler, pressure and pulse volume recording. COMPARISON:  None Available. FINDINGS: Right ABI: 0.70 at the dorsalis pedis. 0.39 at the posterior tibial artery. Left ABI:  Unable to be calculated. Right Lower Extremity: Monophasic arterial waveforms at the ankle. No detectable waveforms in the great toe. Left Lower Extremity:  Normal arterial waveforms at the ankle. IMPRESSION: 1. 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 2. No detectable waveforms in the right great toe, which may be due to small vessel disease or occlusion. 3. Left ankle-brachial index unable to be calculated. Electronically Signed   By: Agustin Cree M.D.   On: 01/11/2023 14:12    Labs on Admission: I have personally reviewed following labs  CBC: Recent Labs  Lab 01/11/23 1126  WBC 7.4  NEUTROABS 5.4  HGB 14.5  HCT 46.1  MCV 92.4  PLT 205   Basic Metabolic Panel: Recent Labs  Lab 01/11/23 1126  NA 133*  K 4.1  CL 90*  CO2 26  GLUCOSE 137*  BUN 39*  CREATININE 8.32*  CALCIUM 9.4   GFR: Estimated Creatinine Clearance: 11.3 mL/min (A) (by C-G formula based on SCr of 8.32 mg/dL (H)).  Coagulation Profile: Recent Labs  Lab 01/11/23 1200  INR 1.1   Urine analysis:    Component Value Date/Time   COLORURINE YELLOW (A) 03/21/2022 2127   APPEARANCEUR CLEAR (A) 03/21/2022 2127   APPEARANCEUR Clear 02/28/2021 1103   LABSPEC 1.005 03/21/2022 2127   PHURINE 9.0 (H) 03/21/2022 2127   GLUCOSEU 150 (A) 03/21/2022 2127   HGBUR LARGE (A) 03/21/2022 2127   BILIRUBINUR NEGATIVE 03/21/2022 2127   BILIRUBINUR Negative 02/28/2021 1103    KETONESUR NEGATIVE 03/21/2022 2127   PROTEINUR 100 (A) 03/21/2022 2127   NITRITE NEGATIVE 03/21/2022 2127   LEUKOCYTESUR NEGATIVE 03/21/2022 2127   This document was prepared using Dragon Voice Recognition software and may include unintentional dictation errors.  Dr. Sedalia Muta [facility]  If 7PM-7AM, please contact overnight-coverage provider If 7AM-7PM, please contact day attending provider [email or web address]  01/11/2023, 2:40 PM

## 2023-01-11 NOTE — Assessment & Plan Note (Signed)
Patient states he stopped taking his antidepression medication

## 2023-01-11 NOTE — Assessment & Plan Note (Addendum)
With right leg pain Not in ischemic limb at this time Vascular has been consulted for revascularization and requesting ABI Aspirin and Plavix held on admission in anticipation of procedure, a.m. team to resume when the benefits outweigh the risk Patient last took his Plavix on 11/14 ABI has been ordered by EDP Symptomatic support: Tramadol 50 mg every 6 hours as needed for moderate pain, 2 days of coverage ordered; fentanyl 12.5 mcg IV every 4 hours as needed for severe pain, 20 hours of coverage ordered AM team to reevaluate patient at bedside for continued opioid pain requirements Admit to telemetry cardiac, inpatient

## 2023-01-11 NOTE — Assessment & Plan Note (Signed)
-   Simvastatin 10 mg daily resumed

## 2023-01-11 NOTE — ED Notes (Signed)
Pt walked to restroom with steady gait

## 2023-01-11 NOTE — ED Notes (Signed)
See triage note  Presents with pain to right leg  States he has a hx of blood clots in the past  and the leg feels the same  Also states he is having some pain to foot

## 2023-01-11 NOTE — Assessment & Plan Note (Signed)
Home amlodipine 10 mg daily, furosemide 80 mg p.o. twice daily resumed on admission Hydralazine 5 mg IV every 6 hours as needed for SBP greater 165, 4 days ordered

## 2023-01-11 NOTE — Assessment & Plan Note (Signed)
Nephrology has been consulted via secure chat and epic order for continuation of hemodialysis

## 2023-01-11 NOTE — Assessment & Plan Note (Signed)
Patient gets hypotensive during/towards the end of hemodialysis Midodrine 10 mg daily as needed, patient to take 30 minutes prior to hemodialysis sessions resumed
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
## 2023-01-11 NOTE — Hospital Course (Signed)
Mr. Fahd (is a 52 year old male with history of hypertension, GERD, hyperlipidemia, ESRD on HD MWF, history of PAD status post right lower extremity revascularization in June 2023, who presents to the emergency department for chief concerns of right leg pain.  Vitals in the ED showed temperature of 98, respiration rate of 18, heart rate of 86, blood pressure 161/98, SpO2 98% on room air.  Serum sodium is 133, potassium 4.1, chloride 90, bicarb 26, BUN of 39, serum creatinine of 8.32, EGFR of 7, nonfasting glucose 137, WBC 7.4, hemoglobin 14.5, platelets of 205.  EDP consulted with vascular, Dr. Evie Lacks recommends getting in ABI of the lower extremities.  ED treatment: None.  ABI of the lower extremity was ordered by EDP.

## 2023-01-12 ENCOUNTER — Encounter: Payer: Self-pay | Admitting: Internal Medicine

## 2023-01-12 DIAGNOSIS — Z01818 Encounter for other preprocedural examination: Secondary | ICD-10-CM

## 2023-01-12 DIAGNOSIS — Z992 Dependence on renal dialysis: Secondary | ICD-10-CM

## 2023-01-12 DIAGNOSIS — I25119 Atherosclerotic heart disease of native coronary artery with unspecified angina pectoris: Secondary | ICD-10-CM | POA: Diagnosis not present

## 2023-01-12 DIAGNOSIS — F17200 Nicotine dependence, unspecified, uncomplicated: Secondary | ICD-10-CM

## 2023-01-12 DIAGNOSIS — E782 Mixed hyperlipidemia: Secondary | ICD-10-CM

## 2023-01-12 DIAGNOSIS — N186 End stage renal disease: Secondary | ICD-10-CM | POA: Diagnosis not present

## 2023-01-12 DIAGNOSIS — Q613 Polycystic kidney, unspecified: Secondary | ICD-10-CM

## 2023-01-12 DIAGNOSIS — I10 Essential (primary) hypertension: Secondary | ICD-10-CM

## 2023-01-12 DIAGNOSIS — I9581 Postprocedural hypotension: Secondary | ICD-10-CM

## 2023-01-12 DIAGNOSIS — I739 Peripheral vascular disease, unspecified: Secondary | ICD-10-CM | POA: Diagnosis not present

## 2023-01-12 LAB — BASIC METABOLIC PANEL
Anion gap: 14 (ref 5–15)
BUN: 46 mg/dL — ABNORMAL HIGH (ref 6–20)
CO2: 28 mmol/L (ref 22–32)
Calcium: 9.3 mg/dL (ref 8.9–10.3)
Chloride: 92 mmol/L — ABNORMAL LOW (ref 98–111)
Creatinine, Ser: 9.28 mg/dL — ABNORMAL HIGH (ref 0.61–1.24)
GFR, Estimated: 6 mL/min — ABNORMAL LOW (ref >60)
Glucose, Bld: 68 mg/dL — ABNORMAL LOW (ref 70–99)
Potassium: 4.7 mmol/L (ref 3.5–5.1)
Sodium: 134 mmol/L — ABNORMAL LOW (ref 135–145)

## 2023-01-12 LAB — CBC
HCT: 43.1 % (ref 39.0–52.0)
Hemoglobin: 13.7 g/dL (ref 13.0–17.0)
MCH: 29.8 pg (ref 26.0–34.0)
MCHC: 31.8 g/dL (ref 30.0–36.0)
MCV: 93.7 fL (ref 80.0–100.0)
Platelets: 194 10*3/uL (ref 150–400)
RBC: 4.6 MIL/uL (ref 4.22–5.81)
RDW: 14.1 % (ref 11.5–15.5)
WBC: 5.4 10*3/uL (ref 4.0–10.5)
nRBC: 0 % (ref 0.0–0.2)

## 2023-01-12 LAB — HEPATITIS B SURFACE ANTIGEN: Hepatitis B Surface Ag: NONREACTIVE

## 2023-01-12 MED ORDER — HYDROMORPHONE HCL 1 MG/ML IJ SOLN
0.5000 mg | INTRAMUSCULAR | Status: DC | PRN
  Administered 2023-01-12 – 2023-01-14 (×8): 0.5 mg via INTRAVENOUS
  Filled 2023-01-12 (×7): qty 0.5

## 2023-01-12 MED ORDER — CALCIUM ACETATE (PHOS BINDER) 667 MG PO CAPS
2001.0000 mg | ORAL_CAPSULE | Freq: Three times a day (TID) | ORAL | Status: DC
  Administered 2023-01-12 – 2023-01-14 (×5): 2001 mg via ORAL
  Filled 2023-01-12 (×5): qty 3

## 2023-01-12 MED ORDER — ANTICOAGULANT SODIUM CITRATE 4% (200MG/5ML) IV SOLN: 5.0000 mL | Status: DC | PRN

## 2023-01-12 MED ORDER — LIDOCAINE HCL (PF) 1 % IJ SOLN: 5.0000 mL | INTRAMUSCULAR | Status: DC | PRN

## 2023-01-12 MED ORDER — HEPARIN SODIUM (PORCINE) 1000 UNIT/ML DIALYSIS: 1000.0000 [IU] | INTRAMUSCULAR | Status: DC | PRN

## 2023-01-12 MED ORDER — ALTEPLASE 2 MG IJ SOLR: 2.0000 mg | Freq: Once | INTRAMUSCULAR | Status: DC | PRN

## 2023-01-12 MED ORDER — PENTAFLUOROPROP-TETRAFLUOROETH EX AERO: 1.0000 | INHALATION_SPRAY | CUTANEOUS | Status: DC | PRN

## 2023-01-12 MED ORDER — LIDOCAINE-PRILOCAINE 2.5-2.5 % EX CREA: 1.0000 | TOPICAL_CREAM | CUTANEOUS | Status: DC | PRN

## 2023-01-12 MED ORDER — NEPRO/CARBSTEADY PO LIQD: 237.0000 mL | ORAL | Status: DC | PRN

## 2023-01-12 MED ORDER — ALBUTEROL SULFATE (2.5 MG/3ML) 0.083% IN NEBU: 2.5000 mg | INHALATION_SOLUTION | Freq: Every day | RESPIRATORY_TRACT | Status: DC | PRN

## 2023-01-12 MED ORDER — HEPARIN SODIUM (PORCINE) 1000 UNIT/ML DIALYSIS: 20.0000 [IU]/kg | INTRAMUSCULAR | Status: DC | PRN

## 2023-01-12 NOTE — Consult Note (Signed)
Hospital Consult    Reason for Consult:  Right Lower extremity pain  Requesting Physician:  Dr Londell Moh MD  MRN #:  [id number]  History of Present Illness: This is a 52 y.o. male with history of hypertension, GERD, hyperlipidemia, ESRD on HD MWF, history of PAD status post right lower extremity revascularization in June 2023, who presents to the emergency department for chief concerns of right leg pain. Patient endorses the leg pain started about a week ago and has progressively gotten worse. He states he called Dr Marijean Heath office and was told to go to the emergency department.   On exam this morning patient is resting comfortably in bed in the emergency department. Patient complains of pain to both lower extremities at rest that worsens when ambulating. Unable to palpate pulses in bilateral lower extremities. Bilateral lower extremities warm to touch and reddened. No complaints overnight. Vitals all remain stable.   Past Medical History:  Diagnosis Date   Anxiety    Arthritis    Asthma    childhood asthma   Chronic kidney disease    Current every day smoker    DVT (deep venous thrombosis) (HCC)    Dyspnea    with exertion   GERD (gastroesophageal reflux disease)    Headache    Hypercholesteremia    Hypertension    Peripheral vascular disease [facility])     Past Surgical History:  Procedure Laterality Date   A/V FISTULAGRAM Right 06/10/2022   Procedure: A/V Fistulagram;  Surgeon: Renford Dills, MD;  Location: [facility] INVASIVE CV LAB;  Service: Cardiovascular;  Laterality: Right;   AV FISTULA PLACEMENT Right 12/26/2015   Procedure: ARTERIOVENOUS (AV) FISTULA CREATION ( RADIAL CEPHALIC );  Surgeon: Renford Dills, MD;  Location: [facility] ORS;  Service: Vascular;  Laterality: Right;   FRACTURE SURGERY Left    screw in left wrist   LOWER EXTREMITY ANGIOGRAPHY Right 07/30/2021   Procedure: Lower Extremity Angiography;  Surgeon: Renford Dills, MD;  Location: [facility] INVASIVE CV LAB;   Service: Cardiovascular;  Laterality: Right;   PERIPHERAL VASCULAR CATHETERIZATION Right 03/28/2016   Procedure: A/V Fistulagram;  Surgeon: Renford Dills, MD;  Location: [facility] INVASIVE CV LAB;  Service: Cardiovascular;  Laterality: Right;   PERIPHERAL VASCULAR CATHETERIZATION N/A 03/28/2016   Procedure: A/V Shunt Intervention;  Surgeon: Renford Dills, MD;  Location: [facility] INVASIVE CV LAB;  Service: Cardiovascular;  Laterality: N/A;   VASCULAR SURGERY Left 07/2015   stent in left thigh, Location:[place]    Allergies  Allergen Reactions   Iodinated Contrast Media Other (See Comments)    hypotension   Oxycodone-Acetaminophen Itching    Prior to Admission medications   Medication Sig Start Date End Date Taking? Authorizing Provider  amLODipine (NORVASC) 10 MG tablet Take 10 mg by mouth daily. 09/14/18  Yes [provider]  calcium acetate (PHOSLO) 667 MG tablet Take 2,001 mg by mouth 3 (three) times daily. 01/11/22  Yes [provider]  clopidogrel (PLAVIX) 75 MG tablet Take 1 tablet (75 mg total) by mouth daily. 06/11/22  Yes Schnier, Latina Craver, MD  furosemide (LASIX) 80 MG tablet Take 80 mg by mouth 2 (two) times daily.   Yes [provider]  HYDROcodone-acetaminophen (NORCO) 10-325 MG tablet Take 1 tablet by mouth every 6 (six) hours. 01/29/22  Yes [provider]  lidocaine (LIDODERM) 5 % Place 1 patch onto the skin daily. Remove & Discard patch within 12 hours or as directed by MD 09/06/22  Yes Dolores Frame,  Yaphank Sink, MD  midodrine (PROAMATINE) 10 MG tablet Take 1 tablet (10 mg total) by mouth daily as needed (Take 30 min prior to HD session). 03/22/22  Yes Sreenath, Sudheer B, MD  RABEprazole (ACIPHEX) 20 MG tablet Take 20 mg by mouth daily. 06/27/22  Yes [provider]  simvastatin (ZOCOR) 10 MG tablet Take 10 mg by mouth daily. 02/12/21  Yes [provider]  VELTASSA 8.4 g packet Take 8.4 g by mouth daily. 11/29/21  Yes [provider]  lisinopril (PRINIVIL,ZESTRIL) 40 MG tablet Take 40 mg by mouth daily. Patient not taking: Reported on 01/11/2023    [provider]  naloxone [facility]) nasal spray 4 mg/0.1 mL 1 spray as directed. 10/03/20   [provider]  VENTOLIN HFA 108 (90 Base) MCG/ACT inhaler Inhale 1-2 puffs into the lungs daily as needed for shortness of breath or wheezing. Patient not taking: Reported on 01/11/2023 03/14/22   [provider]    Social History   Socioeconomic History   Marital status: Single    Spouse name: Not on file   Number of children: Not on file   Years of education: Not on file   Highest education level: Not on file  Occupational History   Not on file  Tobacco Use   Smoking status: Every Day    Current packs/day: 0.25    Types: Cigarettes   Smokeless tobacco: Never  Vaping Use   Vaping status: Never Used  Substance and Sexual Activity   Alcohol use: No   Drug use: Not Currently    Types: Marijuana    Comment: occassional (when out of pain medication)   Sexual activity: Never  Other Topics Concern   Not on file  Social History Narrative   Not on file   Social Determinants of Health   Financial Resource Strain: Not on file  Food Insecurity: Not on file  Transportation Needs: No Transportation Needs (12/24/2021)   Received [facility], [facility]   PRAPARE - Transportation    In the past 12 months, has lack of transportation kept you from medical appointments or from getting medications?: No    Lack of Transportation (Non-Medical): No  Physical Activity: Not on file  Stress: Not on file  Social Connections: Not on file  Intimate Partner Violence: Not on file     Family History  Problem Relation Age of Onset   Hypertension Mother    Polycystic kidney disease Mother    Hypertension Sister     ROS: Otherwise negative unless mentioned in HPI  Physical Examination  Vitals:   01/12/23 0315  01/12/23 0623  BP: (!) 149/72 (!) 159/89  Pulse: 62 69  Resp: 16 14  Temp: 98.3 F (36.8 C)   SpO2: 95% 96%   Body mass index is 26.31 kg/m.  General:  WDWN in NAD Gait: Not observed HENT: WNL, normocephalic Pulmonary: normal non-labored breathing, without Rales, rhonchi,  wheezing Cardiac: regular, without  Murmurs, rubs or gallops; without carotid bruits Abdomen: Positive bowel sounds throughout, soft, NT/ND, no masses Skin: without rashes Vascular Exam/Pulses: Unable to palpate bilateral lower extremity pulses.  Extremities: with ischemic changes, without Gangrene , with cellulitis; without open wounds;  Musculoskeletal: no muscle wasting or atrophy  Neurologic: A&O X 3;  No focal weakness or paresthesias are detected; speech is fluent/normal Psychiatric:  The pt has Normal affect. Lymph:  Unremarkable  CBC    Component Value Date/Time   WBC 5.4  01/12/2023 0521   RBC 4.60 01/12/2023 0521   HGB 13.7 01/12/2023 0521   HCT 43.1 01/12/2023 0521   PLT 194 01/12/2023 0521   MCV 93.7 01/12/2023 0521   MCH 29.8 01/12/2023 0521   MCHC 31.8 01/12/2023 0521   RDW 14.1 01/12/2023 0521   LYMPHSABS 1.3 01/11/2023 1126   MONOABS 0.5 01/11/2023 1126   EOSABS 0.1 01/11/2023 1126   BASOSABS 0.1 01/11/2023 1126    BMET    Component Value Date/Time   NA 134 (L) 01/12/2023 0521   K 4.7 01/12/2023 0521   CL 92 (L) 01/12/2023 0521   CO2 28 01/12/2023 0521   GLUCOSE 68 (L) 01/12/2023 0521   BUN 46 (H) 01/12/2023 0521   CREATININE 9.28 (H) 01/12/2023 0521   CALCIUM 9.3 01/12/2023 0521   GFRNONAA 6 (L) 01/12/2023 0521   GFRAA 12 (L) 03/28/2016 0941    COAGS: Lab Results  Component Value Date   INR 1.1 01/11/2023   INR 1.06 12/11/2015     Non-Invasive Vascular Imaging:   EXAM:01/11/23 NONINVASIVE PHYSIOLOGIC VASCULAR STUDY OF BILATERAL LOWER EXTREMITIES   TECHNIQUE: Evaluation of both lower extremities were performed at rest, including calculation of ankle-brachial  indices with single level Doppler, pressure and pulse volume recording.   COMPARISON:  None Available.   FINDINGS: Right ABI: 0.70 at the dorsalis pedis. 0.39 at the posterior tibial artery.   Left ABI:  Unable to be calculated.   Right Lower Extremity: Monophasic arterial waveforms at the ankle. No detectable waveforms in the great toe.   Left Lower Extremity:  Normal arterial waveforms at the ankle.   IMPRESSION: 1. 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 2. No detectable waveforms in the right great toe, which may be due to small vessel disease or occlusion. 3. Left ankle-brachial index unable to be calculated.  Statin:  Yes.   Beta Blocker:  No. Aspirin:  No. ACEI:  Yes.   ARB:  No. CCB use:  Yes Other antiplatelets/anticoagulants:  Yes.   Plavix 75 mg Daily   ASSESSMENT/PLAN: This is a 52 y.o. male who has seen Dr Gilda Crease in the past for left lower extremity vascular disease now presents to the emergency department with right lower extremity pain. Upon work up patient had bilateral lower extremity ultrasounds with ABI's. The Right ABI: 0.70 at the dorsalis pedis. 0.39 at the posterior tibial artery. Left ABI Unable to be calculated.  Vascular Surgery plans on taking the patient to the vascular lab on Tuesday 01/13/23 for Aortogram with selective right lower extremity angiogram with possible intervention. I discussed in detail with the patient the procedure, benefits, risks and complications. Patient verbalized his understanding and wishes to proceed as soon as possible. I answered all the patients questions. Patient will be made NPO after midnight tonight for the procedure tomorrow.    -I discussed the plan in detail with Dr Vilinda Flake MD and he agrees with the plan.    Marcie [facility] 01/12/2023 7:03 AM

## 2023-01-12 NOTE — H&P (View-Only) (Signed)
Hospital Consult    Reason for Consult:  Right Lower extremity pain  Requesting Physician:  Dr Londell Moh MD  MRN #:  [id number]  History of Present Illness: This is a 52 y.o. male with history of hypertension, GERD, hyperlipidemia, ESRD on HD MWF, history of PAD status post right lower extremity revascularization in June 2023, who presents to the emergency department for chief concerns of right leg pain. Patient endorses the leg pain started about a week ago and has progressively gotten worse. He states he called Dr Marijean Heath office and was told to go to the emergency department.   On exam this morning patient is resting comfortably in bed in the emergency department. Patient complains of pain to both lower extremities at rest that worsens when ambulating. Unable to palpate pulses in bilateral lower extremities. Bilateral lower extremities warm to touch and reddened. No complaints overnight. Vitals all remain stable.   Past Medical History:  Diagnosis Date   Anxiety    Arthritis    Asthma    childhood asthma   Chronic kidney disease    Current every day smoker    DVT (deep venous thrombosis) (HCC)    Dyspnea    with exertion   GERD (gastroesophageal reflux disease)    Headache    Hypercholesteremia    Hypertension    Peripheral vascular disease [facility])     Past Surgical History:  Procedure Laterality Date   A/V FISTULAGRAM Right 06/10/2022   Procedure: A/V Fistulagram;  Surgeon: Renford Dills, MD;  Location: [facility] INVASIVE CV LAB;  Service: Cardiovascular;  Laterality: Right;   AV FISTULA PLACEMENT Right 12/26/2015   Procedure: ARTERIOVENOUS (AV) FISTULA CREATION ( RADIAL CEPHALIC );  Surgeon: Renford Dills, MD;  Location: [facility] ORS;  Service: Vascular;  Laterality: Right;   FRACTURE SURGERY Left    screw in left wrist   LOWER EXTREMITY ANGIOGRAPHY Right 07/30/2021   Procedure: Lower Extremity Angiography;  Surgeon: Renford Dills, MD;  Location: [facility] INVASIVE CV LAB;   Service: Cardiovascular;  Laterality: Right;   PERIPHERAL VASCULAR CATHETERIZATION Right 03/28/2016   Procedure: A/V Fistulagram;  Surgeon: Renford Dills, MD;  Location: [facility] INVASIVE CV LAB;  Service: Cardiovascular;  Laterality: Right;   PERIPHERAL VASCULAR CATHETERIZATION N/A 03/28/2016   Procedure: A/V Shunt Intervention;  Surgeon: Renford Dills, MD;  Location: [facility] INVASIVE CV LAB;  Service: Cardiovascular;  Laterality: N/A;   VASCULAR SURGERY Left 07/2015   stent in left thigh, Location:[place]    Allergies  Allergen Reactions   Iodinated Contrast Media Other (See Comments)    hypotension   Oxycodone-Acetaminophen Itching    Prior to Admission medications   Medication Sig Start Date End Date Taking? Authorizing Provider  amLODipine (NORVASC) 10 MG tablet Take 10 mg by mouth daily. 09/14/18  Yes [provider]  calcium acetate (PHOSLO) 667 MG tablet Take 2,001 mg by mouth 3 (three) times daily. 01/11/22  Yes [provider]  clopidogrel (PLAVIX) 75 MG tablet Take 1 tablet (75 mg total) by mouth daily. 06/11/22  Yes Schnier, Latina Craver, MD  furosemide (LASIX) 80 MG tablet Take 80 mg by mouth 2 (two) times daily.   Yes [provider]  HYDROcodone-acetaminophen (NORCO) 10-325 MG tablet Take 1 tablet by mouth every 6 (six) hours. 01/29/22  Yes [provider]  lidocaine (LIDODERM) 5 % Place 1 patch onto the skin daily. Remove & Discard patch within 12 hours or as directed by MD 09/06/22  Yes Dolores Frame,  Yaphank Sink, MD  midodrine (PROAMATINE) 10 MG tablet Take 1 tablet (10 mg total) by mouth daily as needed (Take 30 min prior to HD session). 03/22/22  Yes Sreenath, Sudheer B, MD  RABEprazole (ACIPHEX) 20 MG tablet Take 20 mg by mouth daily. 06/27/22  Yes [provider]  simvastatin (ZOCOR) 10 MG tablet Take 10 mg by mouth daily. 02/12/21  Yes [provider]  VELTASSA 8.4 g packet Take 8.4 g by mouth daily. 11/29/21  Yes [provider]  lisinopril (PRINIVIL,ZESTRIL) 40 MG tablet Take 40 mg by mouth daily. Patient not taking: Reported on 01/11/2023    [provider]  naloxone [facility]) nasal spray 4 mg/0.1 mL 1 spray as directed. 10/03/20   [provider]  VENTOLIN HFA 108 (90 Base) MCG/ACT inhaler Inhale 1-2 puffs into the lungs daily as needed for shortness of breath or wheezing. Patient not taking: Reported on 01/11/2023 03/14/22   [provider]    Social History   Socioeconomic History   Marital status: Single    Spouse name: Not on file   Number of children: Not on file   Years of education: Not on file   Highest education level: Not on file  Occupational History   Not on file  Tobacco Use   Smoking status: Every Day    Current packs/day: 0.25    Types: Cigarettes   Smokeless tobacco: Never  Vaping Use   Vaping status: Never Used  Substance and Sexual Activity   Alcohol use: No   Drug use: Not Currently    Types: Marijuana    Comment: occassional (when out of pain medication)   Sexual activity: Never  Other Topics Concern   Not on file  Social History Narrative   Not on file   Social Determinants of Health   Financial Resource Strain: Not on file  Food Insecurity: Not on file  Transportation Needs: No Transportation Needs (12/24/2021)   Received [facility], [facility]   PRAPARE - Transportation    In the past 12 months, has lack of transportation kept you from medical appointments or from getting medications?: No    Lack of Transportation (Non-Medical): No  Physical Activity: Not on file  Stress: Not on file  Social Connections: Not on file  Intimate Partner Violence: Not on file     Family History  Problem Relation Age of Onset   Hypertension Mother    Polycystic kidney disease Mother    Hypertension Sister     ROS: Otherwise negative unless mentioned in HPI  Physical Examination  Vitals:   01/12/23 0315  01/12/23 0623  BP: (!) 149/72 (!) 159/89  Pulse: 62 69  Resp: 16 14  Temp: 98.3 F (36.8 C)   SpO2: 95% 96%   Body mass index is 26.31 kg/m.  General:  WDWN in NAD Gait: Not observed HENT: WNL, normocephalic Pulmonary: normal non-labored breathing, without Rales, rhonchi,  wheezing Cardiac: regular, without  Murmurs, rubs or gallops; without carotid bruits Abdomen: Positive bowel sounds throughout, soft, NT/ND, no masses Skin: without rashes Vascular Exam/Pulses: Unable to palpate bilateral lower extremity pulses.  Extremities: with ischemic changes, without Gangrene , with cellulitis; without open wounds;  Musculoskeletal: no muscle wasting or atrophy  Neurologic: A&O X 3;  No focal weakness or paresthesias are detected; speech is fluent/normal Psychiatric:  The pt has Normal affect. Lymph:  Unremarkable  CBC    Component Value Date/Time   WBC 5.4  01/12/2023 0521   RBC 4.60 01/12/2023 0521   HGB 13.7 01/12/2023 0521   HCT 43.1 01/12/2023 0521   PLT 194 01/12/2023 0521   MCV 93.7 01/12/2023 0521   MCH 29.8 01/12/2023 0521   MCHC 31.8 01/12/2023 0521   RDW 14.1 01/12/2023 0521   LYMPHSABS 1.3 01/11/2023 1126   MONOABS 0.5 01/11/2023 1126   EOSABS 0.1 01/11/2023 1126   BASOSABS 0.1 01/11/2023 1126    BMET    Component Value Date/Time   NA 134 (L) 01/12/2023 0521   K 4.7 01/12/2023 0521   CL 92 (L) 01/12/2023 0521   CO2 28 01/12/2023 0521   GLUCOSE 68 (L) 01/12/2023 0521   BUN 46 (H) 01/12/2023 0521   CREATININE 9.28 (H) 01/12/2023 0521   CALCIUM 9.3 01/12/2023 0521   GFRNONAA 6 (L) 01/12/2023 0521   GFRAA 12 (L) 03/28/2016 0941    COAGS: Lab Results  Component Value Date   INR 1.1 01/11/2023   INR 1.06 12/11/2015     Non-Invasive Vascular Imaging:   EXAM:01/11/23 NONINVASIVE PHYSIOLOGIC VASCULAR STUDY OF BILATERAL LOWER EXTREMITIES   TECHNIQUE: Evaluation of both lower extremities were performed at rest, including calculation of ankle-brachial  indices with single level Doppler, pressure and pulse volume recording.   COMPARISON:  None Available.   FINDINGS: Right ABI: 0.70 at the dorsalis pedis. 0.39 at the posterior tibial artery.   Left ABI:  Unable to be calculated.   Right Lower Extremity: Monophasic arterial waveforms at the ankle. No detectable waveforms in the great toe.   Left Lower Extremity:  Normal arterial waveforms at the ankle.   IMPRESSION: 1. 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 2. No detectable waveforms in the right great toe, which may be due to small vessel disease or occlusion. 3. Left ankle-brachial index unable to be calculated.  Statin:  Yes.   Beta Blocker:  No. Aspirin:  No. ACEI:  Yes.   ARB:  No. CCB use:  Yes Other antiplatelets/anticoagulants:  Yes.   Plavix 75 mg Daily   ASSESSMENT/PLAN: This is a 52 y.o. male who has seen Dr Gilda Crease in the past for left lower extremity vascular disease now presents to the emergency department with right lower extremity pain. Upon work up patient had bilateral lower extremity ultrasounds with ABI's. The Right ABI: 0.70 at the dorsalis pedis. 0.39 at the posterior tibial artery. Left ABI Unable to be calculated.  Vascular Surgery plans on taking the patient to the vascular lab on Tuesday 01/13/23 for Aortogram with selective right lower extremity angiogram with possible intervention. I discussed in detail with the patient the procedure, benefits, risks and complications. Patient verbalized his understanding and wishes to proceed as soon as possible. I answered all the patients questions. Patient will be made NPO after midnight tonight for the procedure tomorrow.    -I discussed the plan in detail with Dr Vilinda Flake MD and he agrees with the plan.    Marcie Bal [facility] 01/12/2023 7:03 AM

## 2023-01-12 NOTE — Progress Notes (Signed)
Progress Note   Patient: Oscar Moody. ZOX:[id number] DOB: [birth year] DOA: 01/11/2023     1 DOS: the patient was seen and examined on 01/12/2023   Brief hospital course: Oscar Moody (is a 52 year old male with history of hypertension, GERD, hyperlipidemia, ESRD on HD MWF, history of PAD status post right lower extremity revascularization in June 2023, who presents to the emergency department for chief concerns of right leg pain. ABI of the lower extremity was ordered by EDP. The Right ABI: 0.70 at the dorsalis pedis. 0.39 at the posterior tibial artery. Left ABI Unable to be calculated. Vascular Surgery planned for Aortogram with selective right lower extremity angiogram 01/13/23.  Assessment and Plan: * PAD (peripheral artery disease) (HCC) With right leg pain Vascular surgery advised ABI - The Right ABI: 0.70 at the dorsalis pedis. 0.39 at the posterior tibial artery. Left ABI Unable to be calculated.  Aspirin and Plavix held on admission in anticipation of procedure. Plan per vascular team to take him for  Patient last took his Plavix on 11/14 Aortogram with selective right lower extremity angiogram 01/13/23. Continue pain control, dilaudid for severe pain ordered.  ESRD on dialysis [facility]) Nephrology follow up for continuation of hemodialysis  Hyperlipidemia Continue home Simvastatin 10 mg daily.  Essential hypertension Continue amlodipine 10 mg daily, furosemide 80 mg p.o. twice daily. IV Hydralazine as needed.  Hypotension after procedure Patient gets hypotensive during/towards the end of hemodialysis Midodrine 10 mg daily as needed, patient to take 30 minutes prior to hemodialysis sessions resumed  Tobacco use disorder Smoking cessation counseled. Nicotine patch offered.  Pre-transplant evaluation for CKD (chronic kidney disease) Continue outpatient follow-up as appropriate Recommend cessation of tobacco use  Out of bed to chair. Incentive spirometry. Nursing  supportive care. Fall, aspiration precautions. DVT prophylaxis   Code Status: Full Code  Subjective: Patient is seen and examined today morning. He is lying in bed, has severe pain right leg, foot. Asks for pain meds. Eating fair.  Physical Exam: Vitals:   01/12/23 0623 01/12/23 0730 01/12/23 0900 01/12/23 1209  BP: (!) 159/89 (!) 177/78 (!) 183/76 (!) 154/80  Pulse: 69 70 75 62  Resp: 14 19 16 15   Temp:   97.7 F (36.5 C) 97.6 F (36.4 C)  TempSrc:   Oral Oral  SpO2: 96% 97% 98% 97%  Weight:      Height:        General - Middle aged Caucasian male, no apparent distress HEENT - PERRLA, EOMI, atraumatic head, non tender sinuses. Lung - Clear, no rales, rhonchi, wheezes. Heart - S1, S2 heard, no murmurs, rubs, no pedal edema. Abdomen - Soft, non tender, bowel sounds good Neuro - Alert, awake and oriented x 3, non focal exam. Skin - Warm and dry. Bilateral lower extremity pulses not palpable.  Data Reviewed:      Latest Ref Rng & Units 01/12/2023    5:21 AM 01/11/2023   11:26 AM 07/02/2022   10:17 PM  CBC  WBC 4.0 - 10.5 K/uL 5.4  7.4  5.3   Hemoglobin 13.0 - 17.0 g/dL 81.1  91.4  78.2   Hematocrit 39.0 - 52.0 % 43.1  46.1  51.9   Platelets 150 - 400 K/uL 194  205  186       Latest Ref Rng & Units 01/12/2023    5:21 AM 01/11/2023   11:26 AM 07/02/2022   10:17 PM  BMP  Glucose 70 - 99 mg/dL 68  956  94  BUN 6 - 20 mg/dL 46  39  27   Creatinine 0.61 - 1.24 mg/dL 1.61  0.96  0.45   Sodium 135 - 145 mmol/L 134  133  133   Potassium 3.5 - 5.1 mmol/L 4.7  4.1  4.2   Chloride 98 - 111 mmol/L 92  90  92   CO2 22 - 32 mmol/L 28  26  25    Calcium 8.9 - 10.3 mg/dL 9.3  9.4  40.9    US ARTERIAL ABI (SCREENING LOWER EXTREMITY)  Result Date: 01/11/2023 CLINICAL DATA:  Right calf pain and right-greater-than-left bilateral foot numbness EXAM: NONINVASIVE PHYSIOLOGIC VASCULAR STUDY OF BILATERAL LOWER EXTREMITIES TECHNIQUE: Evaluation of both lower extremities were performed at  rest, including calculation of ankle-brachial indices with single level Doppler, pressure and pulse volume recording. COMPARISON:  None Available. FINDINGS: Right ABI: 0.70 at the dorsalis pedis. 0.39 at the posterior tibial artery. Left ABI:  Unable to be calculated. Right Lower Extremity: Monophasic arterial waveforms at the ankle. No detectable waveforms in the great toe. Left Lower Extremity:  Normal arterial waveforms at the ankle. IMPRESSION: 1. 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 2. No detectable waveforms in the right great toe, which may be due to small vessel disease or occlusion. 3. Left ankle-brachial index unable to be calculated. Electronically Signed   By: Agustin Cree M.D.   On: 01/11/2023 14:12     Family Communication: Discussed with patient, he understand and agree. All questions answereed.    Disposition: Status is: Inpatient Remains inpatient appropriate because: vascular follow up  Planned Discharge Destination: Home with Home Health     Time spent: 40 minutes  Author: Marcelino Duster, MD 01/12/2023 12:50 PM Secure chat 7am to 7pm For on call review [email or web address].

## 2023-01-12 NOTE — Progress Notes (Signed)
[facility]  ROUNDING NOTE   Subjective:   Patient well-known to [place] as we follow him for ESRD at [facility].  Patient normally dialyzes on MWF schedule.  Patient came in with acute pain of his right lower extremity.  He reported claudication symptoms in the right calf made better by relief.  This a.m. patient reports still having some pain in the right foot as well as numbness in the right lower extremity.  He is due for hemodialysis treatment later today.  Objective:  Vital signs in last 24 hours:  Temp:  [97.6 F (36.4 C)-98.3 F (36.8 C)] 97.6 F (36.4 C) (11/18 1209) Pulse Rate:  [62-75] 62 (11/18 1209) Resp:  [14-19] 15 (11/18 1209) BP: (149-193)/(72-89) 154/80 (11/18 1209) SpO2:  [92 %-98 %] 97 % (11/18 1209)  Weight change:  Filed Weights   01/11/23 1123  Weight: 88 kg    Intake/Output: No intake/output data recorded.   Intake/Output this shift:  No intake/output data recorded.  Physical Exam: General: No acute distress  Head: Normocephalic, atraumatic. Moist oral mucosal membranes  Neck: Supple  Lungs:  Clear to auscultation, normal effort  Heart: S1S2 no rubs  Abdomen:  Soft, nontender, bowel sounds present  Extremities: Redness most prominent in the right second, third, and fourth toes  Neurologic: Awake, alert, following commands  Skin: No acute rash  Access: Right upper extremity AV fistula    Basic Metabolic Panel: Recent Labs  Lab 01/11/23 1126 01/12/23 0521  NA 133* 134*  K 4.1 4.7  CL 90* 92*  CO2 26 28  GLUCOSE 137* 68*  BUN 39* 46*  CREATININE 8.32* 9.28*  CALCIUM 9.4 9.3    Liver Function Tests: No results for input(s): "AST", "ALT", "ALKPHOS", "BILITOT", "PROT", "ALBUMIN" in the last 168 hours. No results for input(s): "LIPASE", "AMYLASE" in the last 168 hours. No results for input(s): "AMMONIA" in the last 168 hours.  CBC: Recent Labs  Lab 01/11/23 1126 01/12/23 0521  WBC 7.4 5.4  NEUTROABS 5.4  --   HGB 14.5  13.7  HCT 46.1 43.1  MCV 92.4 93.7  PLT 205 194    Cardiac Enzymes: No results for input(s): "CKTOTAL", "CKMB", "CKMBINDEX", "TROPONINI" in the last 168 hours.  BNP: Invalid input(s): "POCBNP"  CBG: No results for input(s): "GLUCAP" in the last 168 hours.  Microbiology: Results for orders placed or performed during the hospital encounter of 03/21/22  Resp panel by RT-PCR (RSV, Flu A&B, Covid) Anterior Nasal Swab     Status: None   Collection Time: 03/21/22  5:40 PM   Specimen: Anterior Nasal Swab  Result Value Ref Range Status   SARS Coronavirus 2 by RT PCR NEGATIVE NEGATIVE Final    Comment: (NOTE) SARS-CoV-2 target nucleic acids are NOT DETECTED.  The SARS-CoV-2 RNA is generally detectable in upper respiratory specimens during the acute phase of infection. The lowest concentration of SARS-CoV-2 viral copies this assay can detect is 138 copies/mL. A negative result does not preclude SARS-Cov-2 infection and should not be used as the sole basis for treatment or other patient management decisions. A negative result may occur with  improper specimen collection/handling, submission of specimen other than nasopharyngeal swab, presence of viral mutation(s) within the areas targeted by this assay, and inadequate number of viral copies(<138 copies/mL). A negative result must be combined with clinical observations, patient history, and epidemiological information. The expected result is Negative.  Fact Sheet for Patients:  BloggerCourse.com  Fact Sheet for Healthcare Providers:  SeriousBroker.it  This test is no t yet approved or cleared by the [place] and  has been authorized for detection and/or diagnosis of SARS-CoV-2 by FDA under an Emergency Use Authorization (EUA). This EUA will remain  in effect (meaning this test can be used) for the duration of the COVID-19 declaration under Section 564(b)(1) of the Act,  21 U.S.C.section 360bbb-3(b)(1), unless the authorization is terminated  or revoked sooner.       Influenza A by PCR NEGATIVE NEGATIVE Final   Influenza B by PCR NEGATIVE NEGATIVE Final    Comment: (NOTE) The Xpert Xpress SARS-CoV-2/FLU/RSV plus assay is intended as an aid in the diagnosis of influenza from Nasopharyngeal swab specimens and should not be used as a sole basis for treatment. Nasal washings and aspirates are unacceptable for Xpert Xpress SARS-CoV-2/FLU/RSV testing.  Fact Sheet for Patients: BloggerCourse.com  Fact Sheet for Healthcare Providers: SeriousBroker.it  This test is not yet approved or cleared by the [place] FDA and has been authorized for detection and/or diagnosis of SARS-CoV-2 by FDA under an Emergency Use Authorization (EUA). This EUA will remain in effect (meaning this test can be used) for the duration of the COVID-19 declaration under Section 564(b)(1) of the Act, 21 U.S.C. section 360bbb-3(b)(1), unless the authorization is terminated or revoked.     Resp Syncytial Virus by PCR NEGATIVE NEGATIVE Final    Comment: (NOTE) Fact Sheet for Patients: BloggerCourse.com  Fact Sheet for Healthcare Providers: SeriousBroker.it  This test is not yet approved or cleared by the [place] FDA and has been authorized for detection and/or diagnosis of SARS-CoV-2 by FDA under an Emergency Use Authorization (EUA). This EUA will remain in effect (meaning this test can be used) for the duration of the COVID-19 declaration under Section 564(b)(1) of the Act, 21 U.S.C. section 360bbb-3(b)(1), unless the authorization is terminated or revoked.  Performed at [facility], [address] [place], [place] [address]     Coagulation Studies: Recent Labs    01/11/23 1200  LABPROT 14.2  INR 1.1    Urinalysis: No results for input(s):  "COLORURINE", "LABSPEC", "PHURINE", "GLUCOSEU", "HGBUR", "BILIRUBINUR", "KETONESUR", "PROTEINUR", "UROBILINOGEN", "NITRITE", "LEUKOCYTESUR" in the last 72 hours.  Invalid input(s): "APPERANCEUR"    Imaging: US ARTERIAL ABI (SCREENING LOWER EXTREMITY)  Result Date: 01/11/2023 CLINICAL DATA:  Right calf pain and right-greater-than-left bilateral foot numbness EXAM: NONINVASIVE PHYSIOLOGIC VASCULAR STUDY OF BILATERAL LOWER EXTREMITIES TECHNIQUE: Evaluation of both lower extremities were performed at rest, including calculation of ankle-brachial indices with single level Doppler, pressure and pulse volume recording. COMPARISON:  None Available. FINDINGS: Right ABI: 0.70 at the dorsalis pedis. 0.39 at the posterior tibial artery. Left ABI:  Unable to be calculated. Right Lower Extremity: Monophasic arterial waveforms at the ankle. No detectable waveforms in the great toe. Left Lower Extremity:  Normal arterial waveforms at the ankle. IMPRESSION: 1. 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 2. No detectable waveforms in the right great toe, which may be due to small vessel disease or occlusion. 3. Left ankle-brachial index unable to be calculated. Electronically Signed   By: Agustin Cree M.D.   On: 01/11/2023 14:12     Medications:    anticoagulant sodium citrate      amLODipine  10 mg Oral Daily   calcium acetate  2,001 mg Oral TID WC   Chlorhexidine Gluconate Cloth  6 each Topical Q0600  furosemide  80 mg Oral BID   heparin  5,000 Units Subcutaneous Q8H   pantoprazole  40 mg Oral Daily   simvastatin  10 mg Oral Daily   acetaminophen **OR** acetaminophen, albuterol, alteplase, anticoagulant sodium citrate, feeding supplement (NEPRO CARB STEADY), heparin, heparin, hydrALAZINE, HYDROmorphone (DILAUDID) injection, lidocaine (PF), lidocaine-prilocaine, midodrine, nicotine, ondansetron  **OR** ondansetron (ZOFRAN) IV, pentafluoroprop-tetrafluoroeth, senna-docusate, traMADol  Assessment/ Plan:  52 y.o. male with past medical history of ESRD on HD MWF, peripheral arterial disease, GERD, hyperlipidemia, hypertension, anemia of chronic kidney disease, secondary hyperparathyroidism, chronic pain syndrome who presented with right lower extremity pain worrisome for worsening peripheral arterial disease.  [facility]/MWF/[facility]/right upper extremity AV fistula  1.  ESRD on HD MWF.  Patient due for hemodialysis treatment today.  This will likely be scheduled for after his angiogram.  Maintain the patient on MWF schedule while here.  2.  Hypertension.  Maintain the patient on amlodipine and furosemide for now.  3.  Secondary hyperparathyroidism.  Maintain the patient on some estate 3 tablets p.o. 3 times daily with meals and monitor serum phosphorus over the course of the hospitalization.  4.  Peripheral arterial disease with signs of ischemia of the right lower extremity.  Patient going for angiogram today.   LOS: 1 Derica Leiber 11/18/20242:46 PM

## 2023-01-13 ENCOUNTER — Encounter: Payer: Self-pay | Admitting: Anesthesiology

## 2023-01-13 ENCOUNTER — Encounter
Admission: EM | Disposition: A | Discharge status: Home / Self Care | Payer: Self-pay | Source: Home / Self Care | Attending: Internal Medicine

## 2023-01-13 DIAGNOSIS — I70202 Unspecified atherosclerosis of native arteries of extremities, left leg: Secondary | ICD-10-CM

## 2023-01-13 DIAGNOSIS — I70221 Atherosclerosis of native arteries of extremities with rest pain, right leg: Secondary | ICD-10-CM | POA: Diagnosis not present

## 2023-01-13 DIAGNOSIS — I739 Peripheral vascular disease, unspecified: Secondary | ICD-10-CM | POA: Diagnosis not present

## 2023-01-13 DIAGNOSIS — Z9889 Other specified postprocedural states: Secondary | ICD-10-CM | POA: Diagnosis not present

## 2023-01-13 DIAGNOSIS — N186 End stage renal disease: Secondary | ICD-10-CM | POA: Diagnosis not present

## 2023-01-13 DIAGNOSIS — E782 Mixed hyperlipidemia: Secondary | ICD-10-CM | POA: Diagnosis not present

## 2023-01-13 DIAGNOSIS — N19 Unspecified kidney failure: Secondary | ICD-10-CM

## 2023-01-13 DIAGNOSIS — I743 Embolism and thrombosis of arteries of the lower extremities: Secondary | ICD-10-CM

## 2023-01-13 DIAGNOSIS — I700 Atherosclerosis of aorta: Secondary | ICD-10-CM

## 2023-01-13 DIAGNOSIS — T82868A Thrombosis of vascular prosthetic devices, implants and grafts, initial encounter: Secondary | ICD-10-CM

## 2023-01-13 DIAGNOSIS — I25119 Atherosclerotic heart disease of native coronary artery with unspecified angina pectoris: Secondary | ICD-10-CM | POA: Diagnosis not present

## 2023-01-13 HISTORY — PX: LOWER EXTREMITY ANGIOGRAPHY: CATH118251

## 2023-01-13 LAB — CBC
HCT: 41.5 % (ref 39.0–52.0)
Hemoglobin: 13.6 g/dL (ref 13.0–17.0)
MCH: 29.6 pg (ref 26.0–34.0)
MCHC: 32.8 g/dL (ref 30.0–36.0)
MCV: 90.2 fL (ref 80.0–100.0)
Platelets: 183 10*3/uL (ref 150–400)
RBC: 4.6 MIL/uL (ref 4.22–5.81)
RDW: 13.9 % (ref 11.5–15.5)
WBC: 5 10*3/uL (ref 4.0–10.5)
nRBC: 0 % (ref 0.0–0.2)

## 2023-01-13 LAB — RENAL FUNCTION PANEL
Albumin: 3.6 g/dL (ref 3.5–5.0)
Anion gap: 18 — ABNORMAL HIGH (ref 5–15)
BUN: 57 mg/dL — ABNORMAL HIGH (ref 6–20)
CO2: 25 mmol/L (ref 22–32)
Calcium: 9.3 mg/dL (ref 8.9–10.3)
Chloride: 90 mmol/L — ABNORMAL LOW (ref 98–111)
Creatinine, Ser: 10.87 mg/dL — ABNORMAL HIGH (ref 0.61–1.24)
GFR, Estimated: 5 mL/min — ABNORMAL LOW (ref >60)
Glucose, Bld: 61 mg/dL — ABNORMAL LOW (ref 70–99)
Phosphorus: 6.8 mg/dL — ABNORMAL HIGH (ref 2.5–4.6)
Potassium: 5.7 mmol/L — ABNORMAL HIGH (ref 3.5–5.1)
Sodium: 133 mmol/L — ABNORMAL LOW (ref 135–145)

## 2023-01-13 LAB — GLUCOSE, CAPILLARY
Glucose-Capillary: 65 mg/dL — ABNORMAL LOW (ref 70–99)
Glucose-Capillary: 110 mg/dL — ABNORMAL HIGH (ref 70–99)
Glucose-Capillary: 86 mg/dL (ref 70–99)

## 2023-01-13 LAB — HEPATITIS B SURFACE ANTIBODY, QUANTITATIVE: Hep B S AB Quant (Post): 4 m[IU]/mL — ABNORMAL LOW

## 2023-01-13 SURGERY — LOWER EXTREMITY ANGIOGRAPHY
Anesthesia: Moderate Sedation | Laterality: Right

## 2023-01-13 MED ORDER — MIDAZOLAM HCL 2 MG/2ML IJ SOLN
INTRAMUSCULAR | Status: DC | PRN
  Administered 2023-01-13: .5 mg via INTRAVENOUS
  Administered 2023-01-13 (×2): 1 mg via INTRAVENOUS
  Administered 2023-01-13: .5 mg via INTRAVENOUS

## 2023-01-13 MED ORDER — ASPIRIN 325 MG PO TABS
325.0000 mg | ORAL_TABLET | ORAL | Status: AC
  Administered 2023-01-13: 325 mg via ORAL
  Filled 2023-01-13: qty 1

## 2023-01-13 MED ORDER — DIPHENHYDRAMINE HCL 50 MG/ML IJ SOLN
INTRAMUSCULAR | Status: AC
  Filled 2023-01-13: qty 1

## 2023-01-13 MED ORDER — METHYLPREDNISOLONE SODIUM SUCC 125 MG IJ SOLR
125.0000 mg | Freq: Once | INTRAMUSCULAR | Status: AC | PRN
  Administered 2023-01-13: 125 mg via INTRAVENOUS

## 2023-01-13 MED ORDER — HYDROMORPHONE HCL 1 MG/ML IJ SOLN
INTRAMUSCULAR | Status: AC
  Filled 2023-01-13: qty 1

## 2023-01-13 MED ORDER — FENTANYL CITRATE (PF) 100 MCG/2ML IJ SOLN
INTRAMUSCULAR | Status: DC | PRN
  Administered 2023-01-13: 25 ug via INTRAVENOUS
  Administered 2023-01-13: 12.5 ug via INTRAVENOUS
  Administered 2023-01-13: 25 ug via INTRAVENOUS
  Administered 2023-01-13: 12.5 ug via INTRAVENOUS

## 2023-01-13 MED ORDER — SODIUM CHLORIDE 0.9 % IV SOLN: 250.0000 mL | INTRAVENOUS | Status: AC | PRN

## 2023-01-13 MED ORDER — DEXTROSE 50 % IV SOLN
INTRAVENOUS | Status: AC
  Filled 2023-01-13: qty 50

## 2023-01-13 MED ORDER — METHYLPREDNISOLONE SODIUM SUCC 125 MG IJ SOLR
INTRAMUSCULAR | Status: AC
  Filled 2023-01-13: qty 2

## 2023-01-13 MED ORDER — IODIXANOL 320 MG/ML IV SOLN
INTRAVENOUS | Status: DC | PRN
  Administered 2023-01-13: 70 mL via INTRA_ARTERIAL

## 2023-01-13 MED ORDER — SODIUM CHLORIDE 0.9% FLUSH: 3.0000 mL | INTRAVENOUS | Status: DC | PRN

## 2023-01-13 MED ORDER — CLOPIDOGREL BISULFATE 75 MG PO TABS
75.0000 mg | ORAL_TABLET | Freq: Every day | ORAL | Status: DC
  Administered 2023-01-14: 75 mg via ORAL
  Filled 2023-01-13: qty 1

## 2023-01-13 MED ORDER — LIDOCAINE-EPINEPHRINE (PF) 1 %-1:200000 IJ SOLN
INTRAMUSCULAR | Status: DC | PRN
  Administered 2023-01-13: 10 mL

## 2023-01-13 MED ORDER — FENTANYL CITRATE PF 50 MCG/ML IJ SOSY: 12.5000 ug | PREFILLED_SYRINGE | Freq: Once | INTRAMUSCULAR | Status: DC | PRN

## 2023-01-13 MED ORDER — CEFAZOLIN SODIUM-DEXTROSE 1-4 GM/50ML-% IV SOLN
INTRAVENOUS | Status: AC
  Filled 2023-01-13: qty 50

## 2023-01-13 MED ORDER — FAMOTIDINE 20 MG PO TABS
ORAL_TABLET | ORAL | Status: AC
  Filled 2023-01-13: qty 2

## 2023-01-13 MED ORDER — DEXTROSE 50 % IV SOLN
INTRAVENOUS | Status: DC | PRN
  Administered 2023-01-13: 25 mL via INTRAVENOUS

## 2023-01-13 MED ORDER — SODIUM CHLORIDE 0.9 % IV SOLN: INTRAVENOUS | Status: DC

## 2023-01-13 MED ORDER — CEFAZOLIN SODIUM-DEXTROSE 1-4 GM/50ML-% IV SOLN
1.0000 g | INTRAVENOUS | Status: AC
  Administered 2023-01-13: 1 g via INTRAVENOUS
  Filled 2023-01-13: qty 50

## 2023-01-13 MED ORDER — HEPARIN (PORCINE) IN NACL 2000-0.9 UNIT/L-% IV SOLN
INTRAVENOUS | Status: DC | PRN
  Administered 2023-01-13: 1000 mL

## 2023-01-13 MED ORDER — DIPHENHYDRAMINE HCL 50 MG/ML IJ SOLN
50.0000 mg | Freq: Once | INTRAMUSCULAR | Status: AC | PRN
  Administered 2023-01-13: 50 mg via INTRAVENOUS

## 2023-01-13 MED ORDER — HEPARIN SODIUM (PORCINE) 1000 UNIT/ML IJ SOLN
INTRAMUSCULAR | Status: DC | PRN
  Administered 2023-01-13: 7000 [IU] via INTRAVENOUS

## 2023-01-13 MED ORDER — CLOPIDOGREL BISULFATE 75 MG PO TABS
300.0000 mg | ORAL_TABLET | ORAL | Status: AC
  Administered 2023-01-13: 300 mg via ORAL
  Filled 2023-01-13: qty 4

## 2023-01-13 MED ORDER — FENTANYL CITRATE (PF) 100 MCG/2ML IJ SOLN
INTRAMUSCULAR | Status: AC
  Filled 2023-01-13: qty 2

## 2023-01-13 MED ORDER — MIDAZOLAM HCL 5 MG/5ML IJ SOLN
INTRAMUSCULAR | Status: AC
  Filled 2023-01-13: qty 5

## 2023-01-13 MED ORDER — HEPARIN SODIUM (PORCINE) 1000 UNIT/ML IJ SOLN
INTRAMUSCULAR | Status: AC
  Filled 2023-01-13: qty 10

## 2023-01-13 MED ORDER — FAMOTIDINE 20 MG PO TABS
40.0000 mg | ORAL_TABLET | Freq: Once | ORAL | Status: AC | PRN
  Administered 2023-01-13: 40 mg via ORAL

## 2023-01-13 MED ORDER — DEXTROSE 50 % IV SOLN
12.5000 g | INTRAVENOUS | Status: AC
  Administered 2023-01-13: 12.5 g via INTRAVENOUS

## 2023-01-13 MED ORDER — MIDAZOLAM HCL 2 MG/ML PO SYRP: 8.0000 mg | ORAL_SOLUTION | Freq: Once | ORAL | Status: DC | PRN

## 2023-01-13 MED ORDER — ASPIRIN 81 MG PO TBEC
81.0000 mg | DELAYED_RELEASE_TABLET | Freq: Every day | ORAL | Status: DC
  Administered 2023-01-14: 81 mg via ORAL
  Filled 2023-01-13: qty 1

## 2023-01-13 MED ORDER — SODIUM CHLORIDE 0.9% FLUSH
3.0000 mL | Freq: Two times a day (BID) | INTRAVENOUS | Status: DC
  Administered 2023-01-13 – 2023-01-14 (×3): 3 mL via INTRAVENOUS

## 2023-01-13 SURGICAL SUPPLY — 29 items
BALLN LUTONIX 6X220X130 (BALLOONS) ×1
BALLN LUTONIX DCB 5X40X130 (BALLOONS) ×1
BALLN LUTONIX DCB 6X40X130 (BALLOONS) ×1
BALLOON LUTONIX 6X220X130 (BALLOONS) IMPLANT
BALLOON LUTONIX DCB 5X40X130 (BALLOONS) IMPLANT
BALLOON LUTONIX DCB 6X40X130 (BALLOONS) IMPLANT
CATH ANGIO 5F PIGTAIL 65CM (CATHETERS) IMPLANT
CATH BEACON 5 .038 100 VERT TP (CATHETERS) IMPLANT
CATH ROTAREX 135 6FR (CATHETERS) IMPLANT
DEVICE PRESTO INFLATION (MISCELLANEOUS) IMPLANT
DEVICE STARCLOSE SE CLOSURE (Vascular Products) IMPLANT
GLIDEWIRE ADV .035X260CM (WIRE) IMPLANT
GOWN STRL REUS W/ TWL LRG LVL3 (GOWN DISPOSABLE) ×1 IMPLANT
GOWN STRL REUS W/TWL LRG LVL3 (GOWN DISPOSABLE) ×1
LIFESTENT SOLO 7X200X135 (Permanent Stent) IMPLANT
NDL ENTRY 21GA 7CM ECHOTIP (NEEDLE) IMPLANT
NEEDLE ENTRY 21GA 7CM ECHOTIP (NEEDLE) ×1 IMPLANT
PACK ANGIOGRAPHY (CUSTOM PROCEDURE TRAY) ×1 IMPLANT
PAD SORBX EP SHIELD 16.5X12 (MISCELLANEOUS) IMPLANT
SET INTRO CAPELLA COAXIAL (SET/KITS/TRAYS/PACK) IMPLANT
SHEATH BRITE TIP 5FRX11 (SHEATH) IMPLANT
SHEATH RAABE 6FR (SHEATH) IMPLANT
STENT LIFESTENT 5F 6X40X135 (Permanent Stent) IMPLANT
STENT LIFESTENT 5F 7X40X135 (Permanent Stent) IMPLANT
SYR MEDRAD MARK 7 150ML (SYRINGE) IMPLANT
TUBING CONTRAST HIGH PRESS 72 (TUBING) IMPLANT
WIRE G V18X300CM (WIRE) IMPLANT
WIRE GUIDERIGHT .035X150 (WIRE) IMPLANT
WIRE SUPRACORE 300CM (WIRE) IMPLANT

## 2023-01-13 NOTE — Op Note (Signed)
[facility]  Percutaneous Study/Intervention Procedural Note   Date of Surgery: 01/13/2023  Surgeon:  Renford Dills, MD.  Pre-operative Diagnosis: Atherosclerotic occlusive disease bilateral lower extremities with rest pain of the right lower extremity  Post-operative diagnosis:  Same  Procedure(s) Performed:             1.  Introduction catheter into right lower extremity 3rd order catheter placement              2.    Contrast injection right lower extremity for distal runoff             3.  Percutaneous transluminal angioplasty and stent placement right superficial femoral and above-knee popliteal artery in 2 locations             4.  Mechanical thrombectomy of the right SFA stent using the [place] Rex catheter.               5.   Star close closure left common femoral arteriotomy  Anesthesia: Conscious sedation was administered under my direct supervision by the interventional radiology RN. IV Versed plus fentanyl were utilized. Continuous ECG, pulse oximetry and blood pressure was monitored throughout the entire procedure.  Conscious sedation was for a total of 75 minutes.  Sheath: 6 [place] Rabie left common femoral retrograde  Contrast: 70 cc  Fluoroscopy Time: 11.4 minutes  Indications:  Oscar Moody. presents with increasing pain of his right lower extremity.  He presented to the emergency room at [facility] secondary to the pain.  Noninvasive studies as well as physical examination suggest worsening of his atherosclerotic occlusive disease with likely thrombosis of the previously placed SFA stent.  Angiography with the hope for intervention was recommended.  The risks and benefits are reviewed all questions answered patient agrees to proceed.  Procedure:  Oscar Seckel. is a 52 y.o. y.o. male who was identified and appropriate procedural time out was performed.  The patient was then placed supine on the table and prepped  and draped in the usual sterile fashion.    Ultrasound was placed in the sterile sleeve and the left groin was evaluated the left common femoral artery was echolucent and pulsatile indicating patency.  Image was recorded for the permanent record and under real-time visualization a microneedle was inserted into the common femoral artery microwire followed by a micro-sheath.  A J-wire was then advanced through the micro-sheath and a  5 [place] sheath was then inserted over a J-wire. J-wire was then advanced and a 5 French pigtail catheter was positioned at the level of T12. AP projection of the aorta was then obtained. Pigtail catheter was repositioned to above the bifurcation and a LAO view of the pelvis was obtained.  Subsequently a pigtail catheter with the stiff angle Glidewire was used to cross the aortic bifurcation the catheter wire were advanced down into the right distal external iliac artery. Oblique view of the femoral bifurcation was then obtained and subsequently the wire was reintroduced and the pigtail catheter negotiated into the SFA representing third order catheter placement. Distal runoff was then performed.  7000 units of heparin was then given and allowed to circulate and a 6 [place] Rabie sheath was advanced up and over the bifurcation and positioned in the femoral artery  KMP  catheter and stiff angle Glidewire were then negotiated down into the distal popliteal.  Distal runoff was then completed by hand injection through the catheter.  A V18 wire was then introduced and a [place] Rex catheter was advanced over the wire.  Mechanical thrombectomy was then performed from the proximal SFA through the to about the level of Hunter's canal.  Follow-up imaging demonstrated SFA and the SFA stent are now patent there is approximately 60% diffuse residual stenosis.  The Kumpe catheter was reintroduced and the V18 wire exchanged for a supra core wire.  Initially a 7 mm x 200 mm life stent was then  deployed followed by a 7 mm x 40 mm life stent.  These were then postdilated with 6 mm Lutonix drug-eluting balloons inflated to 8 to 10 atm for approximately 1 minute.  Follow-up imaging demonstrated wide patency with less than 10% residual stenosis.  Distal imaging demonstrated a mid popliteal stenosis of greater than 90%.  This was measured and found to be approximately 25 mm in length.  A 6 mm x 40 mm life stent was then deployed across this lesion and postdilated with a 5 mm x 40 mm Lutonix drug-eluting balloon inflated to 8 atm for 1 minute.  Follow-up imaging demonstrated wide patency with less than 10% residual stenosis.  The three-vessel runoff was preserved.  After review of these images the sheath is pulled into the left external iliac oblique of the common femoral is obtained and a Star close device deployed. There no immediate complications.   Findings:  The abdominal aorta is opacified with a bolus injection contrast. Renal arteries are patent but poor filling with no nephrogram consistent with his renal failure. The aorta itself has diffuse disease but no hemodynamically significant lesions. The right common and external iliac arteries are widely patent there is a greater than 80% stenosis of the left common iliac artery.  There is diffuse 60 to 70% stenosis of the left external iliac artery.  The right common femoral is widely patent as is the profunda femoris.  The SFA is occluded shortly after its origin including the previously placed stent.  The SFA and above-knee popliteal reconstituted Hunter's canal and then in the mid popliteal there is a focal 90% stenosis.  The distal popliteal demonstrates diffuse disease and the trifurcation is diffusely diseased with three-vessel runoff to the foot although there is disease present there is no hemodynamically significant stenosis.  Following thrombectomy of the SFA we have reestablished patency but there is greater than 60% residual  stenosis.  Following angioplasty stent placement the SFA and popliteal is now patent with less than 10% residual stenosis and in-line flow and looks quite nice.     Summary: Successful recanalization right lower extremity for limb salvage                        Disposition: Patient was taken to the recovery room in stable condition having tolerated the procedure well.  Oscar Moody, Oscar Moody 01/13/2023,11:55 AM

## 2023-01-13 NOTE — Progress Notes (Signed)
Progress Note   Patient: Oscar Moody. VHQ:[id number] DOB: August 18, 1970 DOA: 01/11/2023     2 DOS: the patient was seen and examined on 01/13/2023   Brief hospital course: Oscar Moody (is a 52 year old male with history of hypertension, GERD, hyperlipidemia, ESRD on HD MWF, history of PAD status post right lower extremity revascularization in June 2023, who presents to the emergency department for chief concerns of right leg pain. ABI of the lower extremity was ordered by EDP. The Right ABI: 0.70 at the dorsalis pedis. 0.39 at the posterior tibial artery. Left ABI Unable to be calculated. Vascular Surgery planned for Aortogram with selective right lower extremity angiogram 01/13/23.  Assessment and Plan: * PAD (peripheral artery disease) (HCC) With right leg pain Vascular surgery advised ABI - The Right ABI: 0.70 at the dorsalis pedis. 0.39 at the posterior tibial artery. Left ABI Unable to be calculated.  Aspirin and Plavix held on admission in anticipation of procedure. Patient to go for aortogram with selective right lower extremity angiogram today. Continue pain control, dilaudid for severe pain ordered.  ESRD on dialysis [facility]) Nephrology follow up for continuation of hemodialysis. Patient is on TTS dialysis schedule.  Hyperlipidemia Continue home Simvastatin 10 mg daily.  Essential hypertension Continue amlodipine 10 mg daily, furosemide 80 mg p.o. twice daily. IV Hydralazine as needed.  Hypotension after procedure Patient gets hypotensive during/towards the end of hemodialysis Midodrine 10 mg daily as needed, patient to take 30 minutes prior to hemodialysis sessions resumed  Tobacco use disorder Smoking cessation counseled. Nicotine patch offered.  Pre-transplant evaluation for CKD (chronic kidney disease) Continue outpatient follow-up as appropriate Recommend cessation of tobacco use  Out of bed to chair. Incentive spirometry. Nursing supportive care. Fall, aspiration  precautions. DVT prophylaxis   Code Status: Full Code  Subjective: Patient is seen and examined today morning. He is lying in bed, continues to have pain right leg.  He is awaiting vascular procedure.  Physical Exam: Vitals:   01/13/23 1500 01/13/23 1530 01/13/23 1600 01/13/23 1630  BP: (!) 175/74 (!) 179/78 (!) 174/84 (!) 190/89  Pulse: 74 80 84 81  Resp: 20 15 (!) 21 13  Temp:      TempSrc:      SpO2: 99% 99% 98% 99%  Weight:      Height:        General - Middle aged Caucasian male, in distress due to pain. HEENT - PERRLA, EOMI, atraumatic head, non tender sinuses. Lung - Clear, no rales, rhonchi, wheezes. Heart - S1, S2 heard, no murmurs, rubs, no pedal edema. Abdomen - Soft, non tender, bowel sounds good Neuro - Alert, awake and oriented x 3, non focal exam. Skin - Warm and dry. Bilateral lower extremity pulses not palpable.  Data Reviewed:      Latest Ref Rng & Units 01/13/2023    8:06 AM 01/12/2023    5:21 AM 01/11/2023   11:26 AM  CBC  WBC 4.0 - 10.5 K/uL 5.0  5.4  7.4   Hemoglobin 13.0 - 17.0 g/dL 41.3  24.4  01.0   Hematocrit 39.0 - 52.0 % 41.5  43.1  46.1   Platelets 150 - 400 K/uL 183  194  205       Latest Ref Rng & Units 01/13/2023    8:06 AM 01/12/2023    5:21 AM 01/11/2023   11:26 AM  BMP  Glucose 70 - 99 mg/dL 61  68  272   BUN 6 - 20 mg/dL 57  46  39   Creatinine 0.61 - 1.24 mg/dL 45.40  9.81  1.91   Sodium 135 - 145 mmol/L 133  134  133   Potassium 3.5 - 5.1 mmol/L 5.7  4.7  4.1   Chloride 98 - 111 mmol/L 90  92  90   CO2 22 - 32 mmol/L 25  28  26    Calcium 8.9 - 10.3 mg/dL 9.3  9.3  9.4    PERIPHERAL VASCULAR CATHETERIZATION  Result Date: 01/13/2023 See surgical note for result.    Family Communication: Discussed with patient, he understand and agree. All questions answereed.  Disposition: Status is: Inpatient Remains inpatient appropriate because: vascular surgery procedure  Planned Discharge Destination: Home with Home  Health     Time spent: 38 minutes  Author: Marcelino Duster, MD 01/13/2023 4:48 PM Secure chat 7am to 7pm For on call review [email or web address].

## 2023-01-13 NOTE — Plan of Care (Signed)

## 2023-01-13 NOTE — Plan of Care (Signed)
  Problem: Education: Goal: Knowledge of General Education information will improve Description: Including pain rating scale, medication(s)/side effects and non-pharmacologic comfort measures Outcome: Progressing   Problem: Health Behavior/Discharge Planning: Goal: Ability to manage health-related needs will improve Outcome: Progressing   Problem: Nutrition: Goal: Adequate nutrition will be maintained Outcome: Progressing   Problem: Safety: Goal: Ability to remain free from injury will improve Outcome: Progressing   Problem: Activity: Goal: Risk for activity intolerance will decrease Outcome: Not Progressing   Problem: Coping: Goal: Level of anxiety will decrease Outcome: Not Progressing   Problem: Skin Integrity: Goal: Risk for impaired skin integrity will decrease Outcome: Not Progressing

## 2023-01-13 NOTE — Progress Notes (Signed)
[facility]  ROUNDING NOTE   Subjective:   Patient well-known to [place] as we follow him for ESRD at [facility].  Patient normally dialyzes on MWF schedule.  Patient came in with acute pain of his right lower extremity.  He reported claudication symptoms in the right calf made better by relief.  Update:  Patient underwent vascular procedure today.  He had percutaneous transluminal angioplasty and stent placement of the right superficial femoral and above knee popliteal artery in 2 locations.  He also had mechanical thrombectomy of the right SFA stent.  He is now seen and evaluated during hemodialysis treatment.  Tolerating well.  Objective:  Vital signs in last 24 hours:  Temp:  [97 F (36.1 C)-97.8 F (36.6 C)] 97 F (36.1 C) (11/19 1229) Pulse Rate:  [0-76] 68 (11/19 1200) Resp:  [8-21] 12 (11/19 1200) BP: (146-201)/(72-123) 178/74 (11/19 1229) SpO2:  [95 %-99 %] 97 % (11/19 1200) Weight:  [90 kg] 90 kg (11/19 1231)  Weight change:  Filed Weights   01/11/23 1123 01/13/23 1231  Weight: 88 kg 90 kg    Intake/Output: No intake/output data recorded.   Intake/Output this shift:  Total I/O In: -  Out: 100 [Urine:100]  Physical Exam: General: No acute distress  Head: Normocephalic, atraumatic. Moist oral mucosal membranes  Neck: Supple  Lungs:  Clear to auscultation, normal effort  Heart: S1S2 no rubs  Abdomen:  Soft, nontender, bowel sounds present  Extremities: 1+ right lower extremity edema  Neurologic: Awake, alert, following commands  Skin: No acute rash  Access: Right upper extremity AV fistula    Basic Metabolic Panel: Recent Labs  Lab 01/11/23 1126 01/12/23 0521 01/13/23 0806  NA 133* 134* 133*  K 4.1 4.7 5.7*  CL 90* 92* 90*  CO2 26 28 25   GLUCOSE 137* 68* 61*  BUN 39* 46* 57*  CREATININE 8.32* 9.28* 10.87*  CALCIUM 9.4 9.3 9.3  PHOS  --   --  6.8*    Liver Function Tests: Recent Labs  Lab 01/13/23 0806  ALBUMIN 3.6   No results  for input(s): "LIPASE", "AMYLASE" in the last 168 hours. No results for input(s): "AMMONIA" in the last 168 hours.  CBC: Recent Labs  Lab 01/11/23 1126 01/12/23 0521 01/13/23 0806  WBC 7.4 5.4 5.0  NEUTROABS 5.4  --   --   HGB 14.5 13.7 13.6  HCT 46.1 43.1 41.5  MCV 92.4 93.7 90.2  PLT 205 194 183    Cardiac Enzymes: No results for input(s): "CKTOTAL", "CKMB", "CKMBINDEX", "TROPONINI" in the last 168 hours.  BNP: Invalid input(s): "POCBNP"  CBG: Recent Labs  Lab 01/13/23 0758 01/13/23 0947 01/13/23 1124  GLUCAP 65* 110* 86    Microbiology: Results for orders placed or performed during the hospital encounter of 03/21/22  Resp panel by RT-PCR (RSV, Flu A&B, Covid) Anterior Nasal Swab     Status: None   Collection Time: 03/21/22  5:40 PM   Specimen: Anterior Nasal Swab  Result Value Ref Range Status   SARS Coronavirus 2 by RT PCR NEGATIVE NEGATIVE Final    Comment: (NOTE) SARS-CoV-2 target nucleic acids are NOT DETECTED.  The SARS-CoV-2 RNA is generally detectable in upper respiratory specimens during the acute phase of infection. The lowest concentration of SARS-CoV-2 viral copies this assay can detect is 138 copies/mL. A negative result does not preclude SARS-Cov-2 infection and should not be used as the sole basis for treatment or other patient management decisions. A negative result may  occur with  improper specimen collection/handling, submission of specimen other than nasopharyngeal swab, presence of viral mutation(s) within the areas targeted by this assay, and inadequate number of viral copies(<138 copies/mL). A negative result must be combined with clinical observations, patient history, and epidemiological information. The expected result is Negative.  Fact Sheet for Patients:  BloggerCourse.com  Fact Sheet for Healthcare Providers:  SeriousBroker.it  This test is no t yet approved or cleared by the  [place] FDA and  has been authorized for detection and/or diagnosis of SARS-CoV-2 by FDA under an Emergency Use Authorization (EUA). This EUA will remain  in effect (meaning this test can be used) for the duration of the COVID-19 declaration under Section 564(b)(1) of the Act, 21 U.S.C.section 360bbb-3(b)(1), unless the authorization is terminated  or revoked sooner.       Influenza A by PCR NEGATIVE NEGATIVE Final   Influenza B by PCR NEGATIVE NEGATIVE Final    Comment: (NOTE) The Xpert Xpress SARS-CoV-2/FLU/RSV plus assay is intended as an aid in the diagnosis of influenza from Nasopharyngeal swab specimens and should not be used as a sole basis for treatment. Nasal washings and aspirates are unacceptable for Xpert Xpress SARS-CoV-2/FLU/RSV testing.  Fact Sheet for Patients: BloggerCourse.com  Fact Sheet for Healthcare Providers: SeriousBroker.it  This test is not yet approved or cleared by the [place] FDA and has been authorized for detection and/or diagnosis of SARS-CoV-2 by FDA under an Emergency Use Authorization (EUA). This EUA will remain in effect (meaning this test can be used) for the duration of the COVID-19 declaration under Section 564(b)(1) of the Act, 21 U.S.C. section 360bbb-3(b)(1), unless the authorization is terminated or revoked.     Resp Syncytial Virus by PCR NEGATIVE NEGATIVE Final    Comment: (NOTE) Fact Sheet for Patients: BloggerCourse.com  Fact Sheet for Healthcare Providers: SeriousBroker.it  This test is not yet approved or cleared by the [place] FDA and has been authorized for detection and/or diagnosis of SARS-CoV-2 by FDA under an Emergency Use Authorization (EUA). This EUA will remain in effect (meaning this test can be used) for the duration of the COVID-19 declaration under Section 564(b)(1) of the Act, 21 U.S.C. section  360bbb-3(b)(1), unless the authorization is terminated or revoked.  Performed at [facility], [address] [place], [place] [address]     Coagulation Studies: Recent Labs    01/11/23 1200  LABPROT 14.2  INR 1.1    Urinalysis: No results for input(s): "COLORURINE", "LABSPEC", "PHURINE", "GLUCOSEU", "HGBUR", "BILIRUBINUR", "KETONESUR", "PROTEINUR", "UROBILINOGEN", "NITRITE", "LEUKOCYTESUR" in the last 72 hours.  Invalid input(s): "APPERANCEUR"    Imaging: PERIPHERAL VASCULAR CATHETERIZATION  Result Date: 01/13/2023 See surgical note for result.  US ARTERIAL ABI (SCREENING LOWER EXTREMITY)  Result Date: 01/11/2023 CLINICAL DATA:  Right calf pain and right-greater-than-left bilateral foot numbness EXAM: NONINVASIVE PHYSIOLOGIC VASCULAR STUDY OF BILATERAL LOWER EXTREMITIES TECHNIQUE: Evaluation of both lower extremities were performed at rest, including calculation of ankle-brachial indices with single level Doppler, pressure and pulse volume recording. COMPARISON:  None Available. FINDINGS: Right ABI: 0.70 at the dorsalis pedis. 0.39 at the posterior tibial artery. Left ABI:  Unable to be calculated. Right Lower Extremity: Monophasic arterial waveforms at the ankle. No detectable waveforms in the great toe. Left Lower Extremity:  Normal arterial waveforms at the ankle. IMPRESSION: 1. 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 2. No detectable waveforms in the  right great toe, which may be due to small vessel disease or occlusion. 3. Left ankle-brachial index unable to be calculated. Electronically Signed   By: Agustin Cree M.D.   On: 01/11/2023 14:12     Medications:    sodium chloride     anticoagulant sodium citrate      amLODipine  10 mg Oral Daily   [START ON 01/14/2023] aspirin EC  81 mg Oral Daily   calcium acetate  2,001 mg Oral TID WC    Chlorhexidine Gluconate Cloth  6 each Topical Q0600   clopidogrel  300 mg Oral STAT   [START ON 01/14/2023] clopidogrel  75 mg Oral Q breakfast   furosemide  80 mg Oral BID   heparin  5,000 Units Subcutaneous Q8H   pantoprazole  40 mg Oral Daily   simvastatin  10 mg Oral Daily   sodium chloride flush  3 mL Intravenous Q12H   sodium chloride, acetaminophen **OR** acetaminophen, albuterol, alteplase, anticoagulant sodium citrate, feeding supplement (NEPRO CARB STEADY), heparin, heparin, hydrALAZINE, HYDROmorphone (DILAUDID) injection, lidocaine (PF), lidocaine-prilocaine, midodrine, nicotine, ondansetron **OR** ondansetron (ZOFRAN) IV, pentafluoroprop-tetrafluoroeth, senna-docusate, sodium chloride flush  Assessment/ Plan:  52 y.o. male with past medical history of ESRD on HD MWF, peripheral arterial disease, GERD, hyperlipidemia, hypertension, anemia of chronic kidney disease, secondary hyperparathyroidism, chronic pain syndrome who presented with right lower extremity pain worrisome for worsening peripheral arterial disease.  [facility]/MWF/[facility]/right upper extremity AV fistula  1.  ESRD on HD MWF.  Patient seen and evaluated during hemodialysis treatment today.  Tolerating well.  UF target 2 kg.  2.  Hypertension.  Continue amlodipine and furosemide for hypertension control.  3.  Secondary hyperparathyroidism.  Continue calcium acetate 3 tablets p.o. 3 times daily with meals.  Phosphorus still a bit high at 6.8. Lab Results  Component Value Date   CALCIUM 9.3 01/13/2023   PHOS 6.8 (H) 01/13/2023    4.  Peripheral arterial disease with signs of ischemia of the right lower extremity.  Patient status post PTA and stent placement right superficial femoral and above-knee popliteal arteries in 2 locations as well as mechanical thrombectomy of the right SFA stent 01/13/2023.   LOS: 2 Aniylah Avans 11/19/20241:38 PM

## 2023-01-13 NOTE — Discharge Planning (Signed)
ESTABLISHED [facility] [address] [place], [place] [address] [phone]  Schedule: MWF 11:00am  Confirmed patient is active and can resume schedule upon discharge.  Dimas Chyle Dialysis Coordinator II  [facility] Cell: [phone] eFax: [phone] [email or web address]

## 2023-01-13 NOTE — Progress Notes (Signed)
Hemodialysis note  Received patient in bed to unit. Alert and oriented.  Informed consent signed and in chart.  Treatment initiated: 1332 Treatment completed: 1725  Patient tolerated well. Transported back to room, alert without acute distress.  Report given to patient's RN.   Patient's Left groin dressing was soaked with clotted blood upon assuming of care. Called OR and was instructed to open the dressing and observe for active bleeding. No active bleeding was noted, no hematoma. Pressure dressing was applied.   Access used: LUA AVF Access issues: none  Total UF removed: 1.8 L (patient was rinsed back 13 mins early due to set up being clotted, TMP was starting to rise and time was bypassed). Medication(s) given:  Clopidogrel 300 mg tab PO, Aspirin 325 mg tab PO, Dilaudid 0.5 mg IV  Post HD weight: 88.4 kg   Wolfgang Phoenix [facility]

## 2023-01-13 NOTE — Interval H&P Note (Signed)
History and Physical Interval Note:  01/13/2023 11:52 AM  Oscar Moody.  has presented today for surgery, with the diagnosis of PAD.  The various methods of treatment have been discussed with the patient and family. After consideration of risks, benefits and other options for treatment, the patient has consented to  Procedure(s): Lower Extremity Angiography (Right) as a surgical intervention.  The patient's history has been reviewed, patient examined, no change in status, stable for surgery.  I have reviewed the patient's chart and labs.  Questions were answered to the patient's satisfaction.     Levora Dredge

## 2023-01-14 ENCOUNTER — Encounter: Payer: Self-pay | Admitting: Vascular Surgery

## 2023-01-14 DIAGNOSIS — I739 Peripheral vascular disease, unspecified: Secondary | ICD-10-CM | POA: Diagnosis not present

## 2023-01-14 DIAGNOSIS — I25119 Atherosclerotic heart disease of native coronary artery with unspecified angina pectoris: Secondary | ICD-10-CM | POA: Diagnosis not present

## 2023-01-14 DIAGNOSIS — M79604 Pain in right leg: Secondary | ICD-10-CM

## 2023-01-14 LAB — RENAL FUNCTION PANEL
Albumin: 3.8 g/dL (ref 3.5–5.0)
Anion gap: 15 (ref 5–15)
BUN: 50 mg/dL — ABNORMAL HIGH (ref 6–20)
CO2: 22 mmol/L (ref 22–32)
Calcium: 9.7 mg/dL (ref 8.9–10.3)
Chloride: 91 mmol/L — ABNORMAL LOW (ref 98–111)
Creatinine, Ser: 8.17 mg/dL — ABNORMAL HIGH (ref 0.61–1.24)
GFR, Estimated: 7 mL/min — ABNORMAL LOW (ref >60)
Glucose, Bld: 133 mg/dL — ABNORMAL HIGH (ref 70–99)
Phosphorus: 4.9 mg/dL — ABNORMAL HIGH (ref 2.5–4.6)
Potassium: 5.1 mmol/L (ref 3.5–5.1)
Sodium: 128 mmol/L — ABNORMAL LOW (ref 135–145)

## 2023-01-14 LAB — CBC
HCT: 42.1 % (ref 39.0–52.0)
Hemoglobin: 14.1 g/dL (ref 13.0–17.0)
MCH: 29.2 pg (ref 26.0–34.0)
MCHC: 33.5 g/dL (ref 30.0–36.0)
MCV: 87.2 fL (ref 80.0–100.0)
Platelets: 199 10*3/uL (ref 150–400)
RBC: 4.83 MIL/uL (ref 4.22–5.81)
RDW: 13.9 % (ref 11.5–15.5)
WBC: 12.6 10*3/uL — ABNORMAL HIGH (ref 4.0–10.5)
nRBC: 0 % (ref 0.0–0.2)

## 2023-01-14 MED ORDER — NEPRO/CARBSTEADY PO LIQD: 237.0000 mL | ORAL | 0 refills | Status: AC | PRN

## 2023-01-14 MED ORDER — ASPIRIN 81 MG PO TBEC: 81.0000 mg | DELAYED_RELEASE_TABLET | Freq: Every day | ORAL | 12 refills | Status: AC

## 2023-01-14 MED ORDER — ACETAMINOPHEN 325 MG PO TABS
ORAL_TABLET | ORAL | Status: AC
  Filled 2023-01-14: qty 2
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
## 2023-01-14 NOTE — Progress Notes (Signed)
Hemodialysis note  Received patient in bed to unit. Alert and oriented.  Informed consent signed and in chart.  Treatment initiated: 0933 Treatment completed: 1134  Patient tolerated well. Transported back to room, alert without acute distress.  Report given to patient's RN.   Access used: RUA AVF Access issues: none  Total UF removed: 1L Medication(s) given:  Tylenol 650 mg tab PO  Post HD weight: 86.8 kg   [facility]

## 2023-01-14 NOTE — Progress Notes (Signed)
  Progress Note    01/14/2023 8:55 AM 1 Day Post-Op  Subjective:  Oscar Moody is a 52 yo male who is POD #1 from right lower extremity angiogram with angioplasty and stent placement with mechanical thrombectomy of the SFA.  On exam this morning patient is resting comfortably in bed in hemodialysis.  Patient endorses his right lower extremity feels better this morning.  He endorses some numbness and tingling that is intermittent.  No complaints overnight vitals all remained stable   Vitals:   01/14/23 0708 01/14/23 0819  BP:  (!) 164/94  Pulse:  85  Resp: 13 18  Temp:  97.8 F (36.6 C)  SpO2:  95%   Physical Exam: Cardiac:  RRR, normal S1 and S2.  No murmurs Lungs: Clear on auscultation throughout.  No rales rhonchi or wheezing noted. Incisions: Left groin incision with dressing clean dry and intact.  No hematoma seroma to note. Extremities: Right lower extremity is warm to touch with palpable PT pulse.  Left lower extremity is normal. Abdomen: Positive bowel sounds throughout, soft, nontender nondistended. Neurologic: Patient is alert and oriented to person place and time.  Patient answers questions and follows commands appropriately.  CBC    Component Value Date/Time   WBC 12.6 (H) 01/14/2023 0743   RBC 4.83 01/14/2023 0743   HGB 14.1 01/14/2023 0743   HCT 42.1 01/14/2023 0743   PLT 199 01/14/2023 0743   MCV 87.2 01/14/2023 0743   MCH 29.2 01/14/2023 0743   MCHC 33.5 01/14/2023 0743   RDW 13.9 01/14/2023 0743   LYMPHSABS 1.3 01/11/2023 1126   MONOABS 0.5 01/11/2023 1126   EOSABS 0.1 01/11/2023 1126   BASOSABS 0.1 01/11/2023 1126    BMET    Component Value Date/Time   NA 128 (L) 01/14/2023 0743   K 5.1 01/14/2023 0743   CL 91 (L) 01/14/2023 0743   CO2 22 01/14/2023 0743   GLUCOSE 133 (H) 01/14/2023 0743   BUN 50 (H) 01/14/2023 0743   CREATININE 8.17 (H) 01/14/2023 0743   CALCIUM 9.7 01/14/2023 0743   GFRNONAA 7 (L) 01/14/2023 0743   GFRAA 12 (L) 03/28/2016  0941    INR    Component Value Date/Time   INR 1.1 01/11/2023 1200     Intake/Output Summary (Last 24 hours) at 01/14/2023 0855 Last data filed at 01/14/2023 0700 Gross per 24 hour  Intake 480 ml  Output 1800 ml  Net -1320 ml     Assessment/Plan:  52 y.o. male is s/p right lower extremity angiogram with angioplasty stent placement and mechanical thrombectomy of SFA.  1 Day Post-Op   PLAN: Okay per vascular surgery for patient discharge later today after hemodialysis.  Patient was instructed he will need to start taking aspirin 81 mg daily and Plavix 75 mg daily with his simvastatin 10 mg daily. Patient is instructed he needs to follow-up with vein and vascular surgery in 3 weeks.  DVT prophylaxis: ASA 81 mg daily, Plavix 75 mg.  Heparin with dialysis.   Marcie [facility] 01/14/2023 8:55 AM

## 2023-01-14 NOTE — Discharge Summary (Signed)
Physician Discharge Summary   Patient: Oscar Moody. MRN: [id number] DOB: 1970/06/25  Admit date:     01/11/2023  Discharge date: 01/14/23  Discharge Physician: Delfino Lovett   PCP: Jenell Milliner, MD   Recommendations at discharge:    F/up with outpt providers as requested  Discharge Diagnoses: Principal Problem:   PAD (peripheral artery disease) (HCC) Active Problems:   ESRD on dialysis [facility])   Coronary artery disease   Hyperlipidemia   Essential hypertension   Anxiety state   History of polycystic kidney disease   Polycystic kidney, unspecified   Pre-transplant evaluation for CKD (chronic kidney disease)   Tobacco use disorder   GERD without esophagitis   Depression   Hypotension after procedure   Pain of right lower extremity   Right leg pain  Hospital Course:  Oscar Moody (is a 52 year old male with history of hypertension, GERD, hyperlipidemia, ESRD on HD MWF, history of PAD status post right lower extremity revascularization in June 2023, who presents to the emergency department for chief concerns of right leg pain. ABI of the lower extremity was ordered by EDP. The Right ABI: 0.70 at the dorsalis pedis. 0.39 at the posterior tibial artery. Left ABI Unable to be calculated.   01/13/23 - right lower extremity angiogram with angioplasty and stent placement with mechanical thrombectomy of the SFA.    Assessment and Plan: * PAD (peripheral artery disease) (HCC) With right leg pain Vascular surgery advised ABI - The Right ABI: 0.70 at the dorsalis pedis. 0.39 at the posterior tibial artery. Left ABI Unable to be calculated. S/p right lower extremity angiogram with angioplasty and stent placement with mechanical thrombectomy of the SFA on 11/19  Aspirin and Plavix at DC Outpt f/up with Vascular surgery   ESRD on dialysis [facility]) Hemodialysis per Nephrology  Patient is on TTS dialysis schedule.   Hyperlipidemia Continue home Simvastatin 10 mg daily.   Essential  hypertension Continue amlodipine 10 mg daily, furosemide 80 mg p.o. twice daily.   Hypotension after procedure Patient gets hypotensive during/towards the end of hemodialysis Midodrine 10 mg daily as needed, patient to take 30 minutes prior to hemodialysis sessions resumed   Tobacco use disorder Smoking cessation counseled. Nicotine patch offered.   Pre-transplant evaluation for CKD (chronic kidney disease) Continue outpatient follow-up as appropriate Recommend cessation of tobacco use      Consultants: Vascular Surgery, Nephro Procedures performed:  right lower extremity angiogram with angioplasty and stent placement with mechanical thrombectomy of the SFA on 11/19 Disposition: Home Diet recommendation:  Discharge Diet Orders (From admission, onward)     Start     Ordered   01/14/23 0000  Diet - low sodium heart healthy        01/14/23 0853           Carb modified diet DISCHARGE MEDICATION: Allergies as of 01/14/2023       Reactions   Iodinated Contrast Media Other (See Comments)   hypotension   Oxycodone-acetaminophen Itching        Medication List     STOP taking these medications    HYDROcodone-acetaminophen 10-325 MG tablet Commonly known as: NORCO   lisinopril 40 MG tablet Commonly known as: ZESTRIL   Ventolin HFA 108 (90 Base) MCG/ACT inhaler Generic drug: albuterol       TAKE these medications    amLODipine 10 MG tablet Commonly known as: NORVASC Take 10 mg by mouth daily.   aspirin EC 81 MG tablet Take 1 tablet (81 mg  total) by mouth daily. Swallow whole. Start taking on: January 15, 2023   calcium acetate 667 MG tablet Commonly known as: PHOSLO Take 2,001 mg by mouth 3 (three) times daily.   clopidogrel 75 MG tablet Commonly known as: Plavix Take 1 tablet (75 mg total) by mouth daily.   feeding supplement (NEPRO CARB STEADY) Liqd Take 237 mLs by mouth as needed (missed meal during dialysis.).   furosemide 80 MG  tablet Commonly known as: LASIX Take 80 mg by mouth 2 (two) times daily.   lidocaine 5 % Commonly known as: LIDODERM Place 1 patch onto the skin daily. Remove & Discard patch within 12 hours or as directed by MD   midodrine 10 MG tablet Commonly known as: PROAMATINE Take 1 tablet (10 mg total) by mouth daily as needed (Take 30 min prior to HD session).   naloxone 4 MG/0.1ML Liqd nasal spray kit Commonly known as: NARCAN 1 spray as directed.   RABEprazole 20 MG tablet Commonly known as: ACIPHEX Take 20 mg by mouth daily.   simvastatin 10 MG tablet Commonly known as: ZOCOR Take 10 mg by mouth daily.   Veltassa 8.4 g packet Generic drug: patiromer Take 8.4 g by mouth daily.        Follow-up Information     Jenell Milliner, MD. Schedule an appointment as soon as possible for a visit in 1 week(s).   Specialty: Internal Medicine Why: [facility] Discharge F/UP Contact information: [address] [facility] [place] [address] [phone]         Schnier, Latina Craver, MD. Schedule an appointment as soon as possible for a visit in 3 week(s).   Specialties: Vascular Surgery, Cardiology, Radiology, Vascular Surgery Why: [facility] Discharge F/UP. Will need Duplex U/S of right lower extremity with ABI Contact information: [address] [place] [address] [phone]                Discharge Exam: Ceasar Mons Weights   01/13/23 1725 01/14/23 0919 01/14/23 1134  Weight: 88.4 kg 88.3 kg 86.8 kg   General - Middle aged Caucasian male, in distress due to pain. HEENT - PERRLA, EOMI, atraumatic head, non tender sinuses. Lung - Clear, no rales, rhonchi, wheezes. Heart - S1, S2 heard, no murmurs, rubs, no pedal edema. Abdomen - Soft, non tender, bowel sounds good Neuro - Alert, awake and oriented x 3, non focal exam. Skin - Warm and dry. Bilateral lower extremity pulses not palpable. HD access - RUE Fistula  Condition at discharge:  good  The results of significant diagnostics from this hospitalization (including imaging, microbiology, ancillary and laboratory) are listed below for reference.   Imaging Studies: PERIPHERAL VASCULAR CATHETERIZATION  Result Date: 01/13/2023 See surgical note for result.  US ARTERIAL ABI (SCREENING LOWER EXTREMITY)  Result Date: 01/11/2023 CLINICAL DATA:  Right calf pain and right-greater-than-left bilateral foot numbness EXAM: NONINVASIVE PHYSIOLOGIC VASCULAR STUDY OF BILATERAL LOWER EXTREMITIES TECHNIQUE: Evaluation of both lower extremities were performed at rest, including calculation of ankle-brachial indices with single level Doppler, pressure and pulse volume recording. COMPARISON:  None Available. FINDINGS: Right ABI: 0.70 at the dorsalis pedis. 0.39 at the posterior tibial artery. Left ABI:  Unable to be calculated. Right Lower Extremity: Monophasic arterial waveforms at the ankle. No detectable waveforms in the great toe. Left Lower Extremity:  Normal arterial waveforms at the ankle. IMPRESSION: 1. Right ankle-brachial index of 0.70 at the dorsalis pedis and 0.39 at the posterior tibial artery, compatible with moderate to  severe arterial occlusive disease. 2. No detectable waveforms in the right great toe, which may be due to small vessel disease or occlusion. 3. Left ankle-brachial index unable to be calculated. Electronically Signed   By: Agustin Cree M.D.   On: 01/11/2023 14:12    Microbiology: Results for orders placed or performed during the hospital encounter of 03/21/22  Resp panel by RT-PCR (RSV, Flu A&B, Covid) Anterior Nasal Swab     Status: None   Collection Time: 03/21/22  5:40 PM   Specimen: Anterior Nasal Swab  Result Value Ref Range Status   SARS Coronavirus 2 by RT PCR NEGATIVE NEGATIVE Final    Comment: (NOTE) SARS-CoV-2 target nucleic acids are NOT DETECTED.  The SARS-CoV-2 RNA is generally detectable in upper respiratory specimens during the acute phase of  infection. The lowest concentration of SARS-CoV-2 viral copies this assay can detect is 138 copies/mL. A negative result does not preclude SARS-Cov-2 infection and should not be used as the sole basis for treatment or other patient management decisions. A negative result may occur with  improper specimen collection/handling, submission of specimen other than nasopharyngeal swab, presence of viral mutation(s) within the areas targeted by this assay, and inadequate number of viral copies(<138 copies/mL). A negative result must be combined with clinical observations, patient history, and epidemiological information. The expected result is Negative.  Fact Sheet for Patients:  BloggerCourse.com  Fact Sheet for Healthcare Providers:  SeriousBroker.it  This test is no t yet approved or cleared by the [place] FDA and  has been authorized for detection and/or diagnosis of SARS-CoV-2 by FDA under an Emergency Use Authorization (EUA). This EUA will remain  in effect (meaning this test can be used) for the duration of the COVID-19 declaration under Section 564(b)(1) of the Act, 21 U.S.C.section 360bbb-3(b)(1), unless the authorization is terminated  or revoked sooner.       Influenza A by PCR NEGATIVE NEGATIVE Final   Influenza B by PCR NEGATIVE NEGATIVE Final    Comment: (NOTE) The Xpert Xpress SARS-CoV-2/FLU/RSV plus assay is intended as an aid in the diagnosis of influenza from Nasopharyngeal swab specimens and should not be used as a sole basis for treatment. Nasal washings and aspirates are unacceptable for Xpert Xpress SARS-CoV-2/FLU/RSV testing.  Fact Sheet for Patients: BloggerCourse.com  Fact Sheet for Healthcare Providers: SeriousBroker.it  This test is not yet approved or cleared by the [place] FDA and has been authorized for detection and/or diagnosis of SARS-CoV-2  by FDA under an Emergency Use Authorization (EUA). This EUA will remain in effect (meaning this test can be used) for the duration of the COVID-19 declaration under Section 564(b)(1) of the Act, 21 U.S.C. section 360bbb-3(b)(1), unless the authorization is terminated or revoked.     Resp Syncytial Virus by PCR NEGATIVE NEGATIVE Final    Comment: (NOTE) Fact Sheet for Patients: BloggerCourse.com  Fact Sheet for Healthcare Providers: SeriousBroker.it  This test is not yet approved or cleared by the [place] FDA and has been authorized for detection and/or diagnosis of SARS-CoV-2 by FDA under an Emergency Use Authorization (EUA). This EUA will remain in effect (meaning this test can be used) for the duration of the COVID-19 declaration under Section 564(b)(1) of the Act, 21 U.S.C. section 360bbb-3(b)(1), unless the authorization is terminated or revoked.  Performed at [facility], [address] [place], [place] [address]     Labs: CBC: Recent Labs  Lab 01/11/23 1126 01/12/23 0521 01/13/23 0806 01/14/23 0743  WBC  7.4 5.4 5.0 12.6*  NEUTROABS 5.4  --   --   --   HGB 14.5 13.7 13.6 14.1  HCT 46.1 43.1 41.5 42.1  MCV 92.4 93.7 90.2 87.2  PLT 205 194 183 199   Basic Metabolic Panel: Recent Labs  Lab 01/11/23 1126 01/12/23 0521 01/13/23 0806 01/14/23 0743  NA 133* 134* 133* 128*  K 4.1 4.7 5.7* 5.1  CL 90* 92* 90* 91*  CO2 26 28 25 22   GLUCOSE 137* 68* 61* 133*  BUN 39* 46* 57* 50*  CREATININE 8.32* 9.28* 10.87* 8.17*  CALCIUM 9.4 9.3 9.3 9.7  PHOS  --   --  6.8* 4.9*   Liver Function Tests: Recent Labs  Lab 01/13/23 0806 01/14/23 0743  ALBUMIN 3.6 3.8   CBG: Recent Labs  Lab 01/13/23 0758 01/13/23 0947 01/13/23 1124  GLUCAP 65* 110* 86    Discharge time spent: greater than 30 minutes.  Signed: Delfino Lovett, MD [facility] 01/14/2023

## 2023-01-14 NOTE — Care Management Important Message (Signed)
Important Message  Patient Details  Name: Oscar Moody. MRN: [id number] Date of Birth: 1970-06-26   Important Message Given:  Yes - Medicare IM  I reviewed the form with the patient and obtained his signature on the initial Important Message from Medicare and I will forward to Health Information Management to be scanned into his medical record. I wished him well and thanked him for his time.    Olegario Messier A Jebadiah Imperato 01/14/2023, 12:45 PM

## 2023-01-14 NOTE — Progress Notes (Signed)
[facility]  ROUNDING NOTE   Subjective:   Patient well-known to [place] as we follow him for ESRD at [facility].  Patient normally dialyzes on MWF schedule.  Patient came in with acute pain of his right lower extremity.  He reported claudication symptoms in the right calf made better by relief.  Update:  Patient recovering well from PTA and stent placement in right lower extremity yesterday.  Seen during dialysis treatment today.  Objective:  Vital signs in last 24 hours:  Temp:  [97 F (36.1 C)-97.8 F (36.6 C)] 97.1 F (36.2 C) (11/20 0914) Pulse Rate:  [0-85] 78 (11/20 0914) Resp:  [8-21] 15 (11/20 0914) BP: (158-201)/(71-123) 166/71 (11/20 0914) SpO2:  [93 %-100 %] 96 % (11/20 0914) Weight:  [88.3 kg-90 kg] 88.3 kg (11/20 0919)  Weight change:  Filed Weights   01/13/23 1231 01/13/23 1725 01/14/23 0919  Weight: 90 kg 88.4 kg 88.3 kg    Intake/Output: I/O last 3 completed shifts: In: 480 [P.O.:480] Out: 1900 [Urine:100; Other:1800]   Intake/Output this shift:  No intake/output data recorded.  Physical Exam: General: No acute distress  Head: Normocephalic, atraumatic. Moist oral mucosal membranes  Neck: Supple  Lungs:  Clear to auscultation, normal effort  Heart: S1S2 no rubs  Abdomen:  Soft, nontender, bowel sounds present  Extremities: 1+ right lower extremity edema  Neurologic: Awake, alert, following commands  Skin: No acute rash  Access: Right upper extremity AV fistula    Basic Metabolic Panel: Recent Labs  Lab 01/11/23 1126 01/12/23 0521 01/13/23 0806 01/14/23 0743  NA 133* 134* 133* 128*  K 4.1 4.7 5.7* 5.1  CL 90* 92* 90* 91*  CO2 26 28 25 22   GLUCOSE 137* 68* 61* 133*  BUN 39* 46* 57* 50*  CREATININE 8.32* 9.28* 10.87* 8.17*  CALCIUM 9.4 9.3 9.3 9.7  PHOS  --   --  6.8* 4.9*    Liver Function Tests: Recent Labs  Lab 01/13/23 0806 01/14/23 0743  ALBUMIN 3.6 3.8   No results for input(s): "LIPASE", "AMYLASE" in the last  168 hours. No results for input(s): "AMMONIA" in the last 168 hours.  CBC: Recent Labs  Lab 01/11/23 1126 01/12/23 0521 01/13/23 0806 01/14/23 0743  WBC 7.4 5.4 5.0 12.6*  NEUTROABS 5.4  --   --   --   HGB 14.5 13.7 13.6 14.1  HCT 46.1 43.1 41.5 42.1  MCV 92.4 93.7 90.2 87.2  PLT 205 194 183 199    Cardiac Enzymes: No results for input(s): "CKTOTAL", "CKMB", "CKMBINDEX", "TROPONINI" in the last 168 hours.  BNP: Invalid input(s): "POCBNP"  CBG: Recent Labs  Lab 01/13/23 0758 01/13/23 0947 01/13/23 1124  GLUCAP 65* 110* 86    Microbiology: Results for orders placed or performed during the hospital encounter of 03/21/22  Resp panel by RT-PCR (RSV, Flu A&B, Covid) Anterior Nasal Swab     Status: None   Collection Time: 03/21/22  5:40 PM   Specimen: Anterior Nasal Swab  Result Value Ref Range Status   SARS Coronavirus 2 by RT PCR NEGATIVE NEGATIVE Final    Comment: (NOTE) SARS-CoV-2 target nucleic acids are NOT DETECTED.  The SARS-CoV-2 RNA is generally detectable in upper respiratory specimens during the acute phase of infection. The lowest concentration of SARS-CoV-2 viral copies this assay can detect is 138 copies/mL. A negative result does not preclude SARS-Cov-2 infection and should not be used as the sole basis for treatment or other patient management decisions. A negative result  may occur with  improper specimen collection/handling, submission of specimen other than nasopharyngeal swab, presence of viral mutation(s) within the areas targeted by this assay, and inadequate number of viral copies(<138 copies/mL). A negative result must be combined with clinical observations, patient history, and epidemiological information. The expected result is Negative.  Fact Sheet for Patients:  BloggerCourse.com  Fact Sheet for Healthcare Providers:  SeriousBroker.it  This test is no t yet approved or cleared by the  [place] FDA and  has been authorized for detection and/or diagnosis of SARS-CoV-2 by FDA under an Emergency Use Authorization (EUA). This EUA will remain  in effect (meaning this test can be used) for the duration of the COVID-19 declaration under Section 564(b)(1) of the Act, 21 U.S.C.section 360bbb-3(b)(1), unless the authorization is terminated  or revoked sooner.       Influenza A by PCR NEGATIVE NEGATIVE Final   Influenza B by PCR NEGATIVE NEGATIVE Final    Comment: (NOTE) The Xpert Xpress SARS-CoV-2/FLU/RSV plus assay is intended as an aid in the diagnosis of influenza from Nasopharyngeal swab specimens and should not be used as a sole basis for treatment. Nasal washings and aspirates are unacceptable for Xpert Xpress SARS-CoV-2/FLU/RSV testing.  Fact Sheet for Patients: BloggerCourse.com  Fact Sheet for Healthcare Providers: SeriousBroker.it  This test is not yet approved or cleared by the [place] FDA and has been authorized for detection and/or diagnosis of SARS-CoV-2 by FDA under an Emergency Use Authorization (EUA). This EUA will remain in effect (meaning this test can be used) for the duration of the COVID-19 declaration under Section 564(b)(1) of the Act, 21 U.S.C. section 360bbb-3(b)(1), unless the authorization is terminated or revoked.     Resp Syncytial Virus by PCR NEGATIVE NEGATIVE Final    Comment: (NOTE) Fact Sheet for Patients: BloggerCourse.com  Fact Sheet for Healthcare Providers: SeriousBroker.it  This test is not yet approved or cleared by the [place] FDA and has been authorized for detection and/or diagnosis of SARS-CoV-2 by FDA under an Emergency Use Authorization (EUA). This EUA will remain in effect (meaning this test can be used) for the duration of the COVID-19 declaration under Section 564(b)(1) of the Act, 21 U.S.C. section  360bbb-3(b)(1), unless the authorization is terminated or revoked.  Performed at [facility], [address] [place], [place] [address]     Coagulation Studies: Recent Labs    01/11/23 1200  LABPROT 14.2  INR 1.1    Urinalysis: No results for input(s): "COLORURINE", "LABSPEC", "PHURINE", "GLUCOSEU", "HGBUR", "BILIRUBINUR", "KETONESUR", "PROTEINUR", "UROBILINOGEN", "NITRITE", "LEUKOCYTESUR" in the last 72 hours.  Invalid input(s): "APPERANCEUR"    Imaging: PERIPHERAL VASCULAR CATHETERIZATION  Result Date: 01/13/2023 See surgical note for result.    Medications:    sodium chloride     anticoagulant sodium citrate      amLODipine  10 mg Oral Daily   aspirin EC  81 mg Oral Daily   calcium acetate  2,001 mg Oral TID WC   Chlorhexidine Gluconate Cloth  6 each Topical Q0600   clopidogrel  75 mg Oral Q breakfast   furosemide  80 mg Oral BID   heparin  5,000 Units Subcutaneous Q8H   pantoprazole  40 mg Oral Daily   simvastatin  10 mg Oral Daily   sodium chloride flush  3 mL Intravenous Q12H   sodium chloride, acetaminophen **OR** acetaminophen, albuterol, alteplase, anticoagulant sodium citrate, feeding supplement (NEPRO CARB STEADY), heparin, heparin, hydrALAZINE, HYDROmorphone (DILAUDID) injection, lidocaine (PF), lidocaine-prilocaine, midodrine, nicotine, ondansetron **OR**  ondansetron (ZOFRAN) IV, pentafluoroprop-tetrafluoroeth, senna-docusate, sodium chloride flush  Assessment/ Plan:  52 y.o. male with past medical history of ESRD on HD MWF, peripheral arterial disease, GERD, hyperlipidemia, hypertension, anemia of chronic kidney disease, secondary hyperparathyroidism, chronic pain syndrome who presented with right lower extremity pain worrisome for worsening peripheral arterial disease.  [facility]/MWF/[facility]/right upper extremity AV fistula  1.  ESRD on HD MWF.  Patient underwent hemodialysis treatment yesterday but we are performing dialysis treatment  again today to get him back on his regular schedule.  Tolerating treatment well thus far.  2.  Hypertension.  Continue amlodipine and furosemide for blood pressure control.  3.  Secondary hyperparathyroidism.  Continue calcium acetate 3 tablets p.o. 3 times daily with meals.  Phosphorus now at target at 4.9. Lab Results  Component Value Date   CALCIUM 9.7 01/14/2023   PHOS 4.9 (H) 01/14/2023    4.  Peripheral arterial disease with signs of ischemia of the right lower extremity.  Patient status post PTA and stent placement right superficial femoral and above-knee popliteal arteries in 2 locations as well as mechanical thrombectomy of the right SFA stent 01/13/2023.   LOS: 3 Kippy Gohman 11/20/20249:33 AM

## 2023-01-14 NOTE — Progress Notes (Signed)
Patient discharging home, all DC paperwork reviewed and sent with pt. Pt leaving with all personal belongings independently via personal vehicle. Pt escorted to exit by hospital volunteer

## 2023-01-15 ENCOUNTER — Ambulatory Visit (INDEPENDENT_AMBULATORY_CARE_PROVIDER_SITE_OTHER): Payer: 59

## 2023-01-15 ENCOUNTER — Ambulatory Visit (INDEPENDENT_AMBULATORY_CARE_PROVIDER_SITE_OTHER): Payer: 59 | Admitting: Nurse Practitioner

## 2023-01-15 ENCOUNTER — Encounter (INDEPENDENT_AMBULATORY_CARE_PROVIDER_SITE_OTHER): Payer: Self-pay | Admitting: Nurse Practitioner

## 2023-01-15 VITALS — BP 167/81 | HR 80 | Resp 18 | Ht 72.0 in | Wt 197.4 lb

## 2023-01-15 DIAGNOSIS — N186 End stage renal disease: Secondary | ICD-10-CM

## 2023-01-15 DIAGNOSIS — Z992 Dependence on renal dialysis: Secondary | ICD-10-CM

## 2023-01-15 DIAGNOSIS — I10 Essential (primary) hypertension: Secondary | ICD-10-CM | POA: Diagnosis not present

## 2023-01-15 DIAGNOSIS — I70211 Atherosclerosis of native arteries of extremities with intermittent claudication, right leg: Secondary | ICD-10-CM | POA: Diagnosis not present

## 2023-01-15 DIAGNOSIS — Z9889 Other specified postprocedural states: Secondary | ICD-10-CM | POA: Diagnosis not present

## 2023-01-15 DIAGNOSIS — L600 Ingrowing nail: Secondary | ICD-10-CM | POA: Diagnosis not present

## 2023-01-15 DIAGNOSIS — I739 Peripheral vascular disease, unspecified: Secondary | ICD-10-CM | POA: Diagnosis not present

## 2023-01-15 DIAGNOSIS — I70203 Unspecified atherosclerosis of native arteries of extremities, bilateral legs: Secondary | ICD-10-CM | POA: Diagnosis not present

## 2023-01-15 LAB — VAS US ABI WITH/WO TBI: Right ABI: 0.85
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
## 2023-02-14 ENCOUNTER — Emergency Department: Payer: 59

## 2023-02-14 ENCOUNTER — Other Ambulatory Visit: Payer: Self-pay

## 2023-02-14 ENCOUNTER — Emergency Department
Admission: EM | Admit: 2023-02-14 | Discharge: 2023-02-14 | Disposition: A | Discharge status: Home / Self Care | Payer: 59 | Attending: Student in an Organized Health Care Education/Training Program | Admitting: Student in an Organized Health Care Education/Training Program

## 2023-02-14 DIAGNOSIS — R079 Chest pain, unspecified: Secondary | ICD-10-CM | POA: Diagnosis present

## 2023-02-14 DIAGNOSIS — M79671 Pain in right foot: Secondary | ICD-10-CM | POA: Diagnosis not present

## 2023-02-14 DIAGNOSIS — R202 Paresthesia of skin: Secondary | ICD-10-CM | POA: Diagnosis not present

## 2023-02-14 DIAGNOSIS — Z992 Dependence on renal dialysis: Secondary | ICD-10-CM | POA: Insufficient documentation

## 2023-02-14 DIAGNOSIS — M79672 Pain in left foot: Secondary | ICD-10-CM | POA: Diagnosis not present

## 2023-02-14 LAB — CBC WITH DIFFERENTIAL/PLATELET
Abs Immature Granulocytes: 0.01 10*3/uL (ref 0.00–0.07)
Basophils Absolute: 0 10*3/uL (ref 0.0–0.1)
Basophils Relative: 1 %
Eosinophils Absolute: 0.2 10*3/uL (ref 0.0–0.5)
Eosinophils Relative: 3 %
HCT: 46 % (ref 39.0–52.0)
Hemoglobin: 14.1 g/dL (ref 13.0–17.0)
Immature Granulocytes: 0 %
Lymphocytes Relative: 24 %
Lymphs Abs: 1.3 10*3/uL (ref 0.7–4.0)
MCH: 28.8 pg (ref 26.0–34.0)
MCHC: 30.7 g/dL (ref 30.0–36.0)
MCV: 94.1 fL (ref 80.0–100.0)
Monocytes Absolute: 0.6 10*3/uL (ref 0.1–1.0)
Monocytes Relative: 11 %
Neutro Abs: 3.5 10*3/uL (ref 1.7–7.7)
Neutrophils Relative %: 61 %
Platelets: 194 10*3/uL (ref 150–400)
RBC: 4.89 MIL/uL (ref 4.22–5.81)
RDW: 14.2 % (ref 11.5–15.5)
WBC: 5.6 10*3/uL (ref 4.0–10.5)
nRBC: 0 % (ref 0.0–0.2)

## 2023-02-14 LAB — BASIC METABOLIC PANEL
Anion gap: 13 (ref 5–15)
BUN: 40 mg/dL — ABNORMAL HIGH (ref 6–20)
CO2: 28 mmol/L (ref 22–32)
Calcium: 10.2 mg/dL (ref 8.9–10.3)
Chloride: 93 mmol/L — ABNORMAL LOW (ref 98–111)
Creatinine, Ser: 6.32 mg/dL — ABNORMAL HIGH (ref 0.61–1.24)
GFR, Estimated: 10 mL/min — ABNORMAL LOW (ref >60)
Glucose, Bld: 102 mg/dL — ABNORMAL HIGH (ref 70–99)
Potassium: 4.8 mmol/L (ref 3.5–5.1)
Sodium: 134 mmol/L — ABNORMAL LOW (ref 135–145)

## 2023-02-14 LAB — TROPONIN I (HIGH SENSITIVITY)
Troponin I (High Sensitivity): 12 ng/L (ref <18)
Troponin I (High Sensitivity): 15 ng/L (ref <18)

## 2023-02-14 MED ORDER — MORPHINE SULFATE (PF) 4 MG/ML IV SOLN
4.0000 mg | INTRAVENOUS | Status: DC | PRN
  Administered 2023-02-14: 4 mg via INTRAMUSCULAR

## 2023-02-14 MED ORDER — MORPHINE SULFATE (PF) 4 MG/ML IV SOLN
4.0000 mg | INTRAVENOUS | Status: DC | PRN
  Filled 2023-02-14: qty 1

## 2023-02-14 MED ORDER — ONDANSETRON HCL 4 MG/2ML IJ SOLN
4.0000 mg | Freq: Once | INTRAMUSCULAR | Status: DC
  Filled 2023-02-14: qty 2

## 2023-02-14 NOTE — ED Provider Notes (Signed)
[facility] Provider Note    Event Date/Time   First MD Initiated Contact with Patient 02/14/23 2104     (approximate)   History   Numbness (In both feet) and Chest Pain   HPI  Oscar Moody. is a 52 y.o. male presents to the ER for evaluation some numbness in his toes and foot pain that has been present since before his recent stenting and vascular procedure as well as some chest discomfort.  He has been compliant with his dialysis.  States that his pain doctor has been discussing changing his pain medications.  Denies any fevers.  No shortness of breath.  Pain does go up into his back.  States he has a history of chronic back pain been compliant with his Plavix.     Physical Exam   Triage Vital Signs: ED Triage Vitals  Encounter Vitals Group     BP 02/14/23 1654 (!) 176/141     Systolic BP Percentile --      Diastolic BP Percentile --      Pulse Rate 02/14/23 1654 92     Resp 02/14/23 1654 18     Temp 02/14/23 1654 98.5 F (36.9 C)     Temp Source 02/14/23 1654 Oral     SpO2 02/14/23 1654 96 %     Weight 02/14/23 1653 190 lb (86.2 kg)     Height 02/14/23 1653 6' (1.829 m)     Head Circumference --      Peak Flow --      Pain Score 02/14/23 1653 8     Pain Loc --      Pain Education --      Exclude from Growth Chart --     Most recent vital signs: Vitals:   02/14/23 1654 02/14/23 2036  BP: (!) 176/141 (!) 142/77  Pulse: 92 81  Resp: 18 14  Temp: 98.5 F (36.9 C) 98.1 F (36.7 C)  SpO2: 96% 99%     Constitutional: Alert  Eyes: Conjunctivae are normal.  Head: Atraumatic. Nose: No congestion/rhinnorhea. Mouth/Throat: Mucous membranes are moist.   Neck: Painless ROM.  Cardiovascular:   Good peripheral circulation. No m/g/r Respiratory: Normal respiratory effort.  No retractions.  Gastrointestinal: Soft and nontender.  Musculoskeletal:  no deformity.  DP and PT Doppler signals are present bilaterally the right DP signal is  weaker as compared to the left but does have brisk cap refill no cyanosis noted compartments are soft. Neurologic:  MAE spontaneously. No gross focal neurologic deficits are appreciated.  Skin:  Skin is warm, dry and intact. No rash noted. Psychiatric: Mood and affect are normal. Speech and behavior are normal.    ED Results / Procedures / Treatments   Labs (all labs ordered are listed, but only abnormal results are displayed) Labs Reviewed  BASIC METABOLIC PANEL - Abnormal; Notable for the following components:      Result Value   Sodium 134 (*)    Chloride 93 (*)    Glucose, Bld 102 (*)    BUN 40 (*)    Creatinine, Ser 6.32 (*)    GFR, Estimated 10 (*)    All other components within normal limits  CBC WITH DIFFERENTIAL/PLATELET  TROPONIN I (HIGH SENSITIVITY)  TROPONIN I (HIGH SENSITIVITY)     EKG  ED ECG REPORT I, Willy Eddy, the attending physician, personally viewed and interpreted this ECG.   Date: 02/14/2023  EKG Time: 16:59  Rate: 80  Rhythm:  sinus  Axis: normal  Intervals: normal  ST&T Change:  no stemi, no depressions    RADIOLOGY Please see ED Course for my review and interpretation.  I personally reviewed all radiographic images ordered to evaluate for the above acute complaints and reviewed radiology reports and findings.  These findings were personally discussed with the patient.  Please see medical record for radiology report.    PROCEDURES:  Critical Care performed: No  Procedures   MEDICATIONS ORDERED IN ED: Medications  morphine (PF) 4 MG/ML injection 4 mg (4 mg Intramuscular Given 02/14/23 2158)     IMPRESSION / MDM / ASSESSMENT AND PLAN / ED COURSE  I reviewed the triage vital signs and the nursing notes.                              Differential diagnosis includes, but is not limited to, PAD, claudication, stenosis, thrombosis, ACS, dissection, PE, chronic back pain  Patient presenting to the ER for evaluation of symptoms  as described above.  Based on symptoms, risk factors and considered above differential, this presenting complaint could reflect a potentially life-threatening illness therefore the patient will be placed on continuous pulse oximetry and telemetry for monitoring.  Laboratory evaluation will be sent to evaluate for the above complaints.  His exam is not consistent with dissection, acute thrombus or acute ischemic event.  Will treat pain.  Will observe on cardiac monitor for serial enzymes.  Initial troponin negative and EKG non ischemic appearing.    Clinical Course as of 02/14/23 2200  Sat Feb 14, 2023  2158 Cardiac enzyme negative.  Patient remains nontoxic in no acute distress.  He does appear stable and appropriate for outpatient follow-up with vascular surgery, and cardiology. [PR]    Clinical Course User Index [PR] Willy Eddy, MD     FINAL CLINICAL IMPRESSION(S) / ED DIAGNOSES   Final diagnoses:  Pain in both feet  Chest pain, unspecified type     Rx / DC Orders   ED Discharge Orders          Ordered    Ambulatory referral to Cardiology       Comments: If you have not heard from the Cardiology office within the next 72 hours please call [phone].   02/14/23 2200             Note:  This document was prepared using Dragon voice recognition software and may include unintentional dictation errors.    Willy Eddy, MD 02/14/23 2200

## 2023-02-14 NOTE — ED Provider Triage Note (Signed)
Emergency Medicine Provider Triage Evaluation Note  Oscar Moody. , a 52 y.o. male  was evaluated in triage.  Pt complains of elevated BP in the 200s at home, goes to dialysis MWF, has not missed a treatment. Reports left sided chest pain and left arm pain.  Describes his arm as feeling tight.  Review of Systems  Positive: Chest pain, left arm pain Negative:   Physical Exam  BP (!) 176/141 (BP Location: Left Arm)   Pulse 92   Temp 98.5 F (36.9 C) (Oral)   Resp 18   Ht 6' (1.829 m)   Wt 86.2 kg   SpO2 96%   BMI 25.77 kg/m  Gen:   Awake, no distress   Resp:  Normal effort  MSK:   Moves extremities without difficulty  Other:    Medical Decision Making  Medically screening exam initiated at 4:58 PM.  Appropriate orders placed.  Oscar Moody. was informed that the remainder of the evaluation will be completed by another provider, this initial triage assessment does not replace that evaluation, and the importance of remaining in the ED until their evaluation is complete.     Cameron Ali, PA-C 02/14/23 1702

## 2023-02-14 NOTE — ED Triage Notes (Signed)
Surgery done recently on leg for blockages. Toes numb on R foot and L foot. R foot slightly swollen and red. States systolic BP is in the 200s. Also said he has some L sided chest pain

## 2023-03-04 ENCOUNTER — Other Ambulatory Visit (INDEPENDENT_AMBULATORY_CARE_PROVIDER_SITE_OTHER): Payer: Self-pay | Admitting: Nurse Practitioner

## 2023-03-04 DIAGNOSIS — I739 Peripheral vascular disease, unspecified: Secondary | ICD-10-CM

## 2023-03-05 ENCOUNTER — Ambulatory Visit (INDEPENDENT_AMBULATORY_CARE_PROVIDER_SITE_OTHER): Payer: 59 | Admitting: Nurse Practitioner

## 2023-03-05 ENCOUNTER — Ambulatory Visit (INDEPENDENT_AMBULATORY_CARE_PROVIDER_SITE_OTHER): Payer: Medicare Other | Admitting: Nurse Practitioner

## 2023-03-05 ENCOUNTER — Encounter (INDEPENDENT_AMBULATORY_CARE_PROVIDER_SITE_OTHER): Payer: 59

## 2023-03-05 ENCOUNTER — Encounter (INDEPENDENT_AMBULATORY_CARE_PROVIDER_SITE_OTHER): Payer: Medicare Other

## 2023-04-07 ENCOUNTER — Ambulatory Visit (INDEPENDENT_AMBULATORY_CARE_PROVIDER_SITE_OTHER): Payer: 59 | Admitting: Nurse Practitioner

## 2023-04-07 ENCOUNTER — Ambulatory Visit (INDEPENDENT_AMBULATORY_CARE_PROVIDER_SITE_OTHER): Payer: 59

## 2023-04-07 ENCOUNTER — Encounter (INDEPENDENT_AMBULATORY_CARE_PROVIDER_SITE_OTHER): Payer: Self-pay | Admitting: Nurse Practitioner

## 2023-04-07 VITALS — BP 190/76 | HR 78 | Resp 18 | Ht 72.0 in | Wt 200.4 lb

## 2023-04-07 DIAGNOSIS — I10 Essential (primary) hypertension: Secondary | ICD-10-CM

## 2023-04-07 DIAGNOSIS — I739 Peripheral vascular disease, unspecified: Secondary | ICD-10-CM | POA: Diagnosis not present

## 2023-04-07 DIAGNOSIS — Z9889 Other specified postprocedural states: Secondary | ICD-10-CM

## 2023-04-07 DIAGNOSIS — N186 End stage renal disease: Secondary | ICD-10-CM | POA: Diagnosis not present

## 2023-04-07 DIAGNOSIS — Z992 Dependence on renal dialysis: Secondary | ICD-10-CM

## 2023-04-07 NOTE — Progress Notes (Signed)
Subjective:    Patient ID: Oscar Essex., male    DOB: 04-Jul-1970, 53 y.o.   MRN: [id number] Chief Complaint  Patient presents with   Follow-up    approx 1 month ABI +    The patient returns to the office for followup regarding atherosclerotic changes of the lower extremities and review of the noninvasive studies.   There have been no interval changes in lower extremity symptoms. No interval shortening of the patient's claudication distance or development of rest pain symptoms. No new ulcers or wounds have occurred since the last visit.  There have been no significant changes to the patient's overall health care.  The patient denies amaurosis fugax or recent TIA symptoms. There are no documented recent neurological changes noted. There is no history of DVT, PE or superficial thrombophlebitis. The patient denies recent episodes of angina or shortness of breath.   ABI Rt=0.70 and Lt=0.79  (previous ABI's Rt=0.85 and Lt=[place]) Duplex ultrasound of the bilateral lower extremities reveals biphasic/monophasic waveforms of the tibial vessels.    Review of Systems  Cardiovascular:  Negative for leg swelling.  Neurological:  Positive for numbness.  All other systems reviewed and are negative.      Objective:   Physical Exam Vitals reviewed.  HENT:     Head: Normocephalic.  Cardiovascular:     Rate and Rhythm: Normal rate.     Pulses:          Dorsalis pedis pulses are detected w/ Doppler on the right side and detected w/ Doppler on the left side.       Posterior tibial pulses are detected w/ Doppler on the right side and detected w/ Doppler on the left side.  Pulmonary:     Effort: Pulmonary effort is normal.  Skin:    General: Skin is warm and dry.     Capillary Refill: Capillary refill takes 2 to 3 seconds.  Neurological:     Mental Status: He is alert and oriented to person, place, and time.  Psychiatric:        Mood and Affect: Mood normal.        Behavior: Behavior  normal.        Thought Content: Thought content normal.        Judgment: Judgment normal.     BP (!) 190/76   Pulse 78   Resp 18   Ht 6' (1.829 m)   Wt 200 lb 6.4 oz (90.9 kg)   BMI 27.18 kg/m   Past Medical History:  Diagnosis Date   Anxiety    Arthritis    Asthma    childhood asthma   Chronic kidney disease    Current every day smoker    DVT (deep venous thrombosis) (HCC)    Dyspnea    with exertion   GERD (gastroesophageal reflux disease)    Headache    Hypercholesteremia    Hypertension    Peripheral vascular disease (HCC)     Social History   Socioeconomic History   Marital status: Single    Spouse name: Not on file   Number of children: Not on file   Years of education: Not on file   Highest education level: Not on file  Occupational History   Not on file  Tobacco Use   Smoking status: Every Day    Current packs/day: 0.25    Types: Cigarettes   Smokeless tobacco: Never  Vaping Use   Vaping status: Never Used  Substance and  Sexual Activity   Alcohol use: No   Drug use: Not Currently    Types: Marijuana    Comment: occassional (when out of pain medication)   Sexual activity: Never  Other Topics Concern   Not on file  Social History Narrative   Not on file   Social Drivers of Health   Financial Resource Strain: Not on file  Food Insecurity: No Food Insecurity (01/12/2023)   Hunger Vital Sign    Worried About Running Out of Food in the Last Year: Never true    Ran Out of Food in the Last Year: Never true  Transportation Needs: No Transportation Needs (01/12/2023)   PRAPARE - Administrator, Civil Service (Medical): No    Lack of Transportation (Non-Medical): No  Physical Activity: Not on file  Stress: Not on file  Social Connections: Not on file  Intimate Partner Violence: Not At Risk (01/12/2023)   Humiliation, Afraid, Rape, and Kick questionnaire    Fear of Current or Ex-Partner: No    Emotionally Abused: No    Physically  Abused: No    Sexually Abused: No    Past Surgical History:  Procedure Laterality Date   A/V FISTULAGRAM Right 06/10/2022   Procedure: A/V Fistulagram;  Surgeon: Renford Dills, MD;  Location: [facility];  Service: Cardiovascular;  Laterality: Right;   AV FISTULA PLACEMENT Right 12/26/2015   Procedure: ARTERIOVENOUS (AV) FISTULA CREATION ( RADIAL CEPHALIC );  Surgeon: Renford Dills, MD;  Location: [facility];  Service: Vascular;  Laterality: Right;   FRACTURE SURGERY Left    screw in left wrist   LOWER EXTREMITY ANGIOGRAPHY Right 07/30/2021   Procedure: Lower Extremity Angiography;  Surgeon: Renford Dills, MD;  Location: [facility];  Service: Cardiovascular;  Laterality: Right;   LOWER EXTREMITY ANGIOGRAPHY Right 01/13/2023   Procedure: Lower Extremity Angiography;  Surgeon: Renford Dills, MD;  Location: [facility];  Service: Cardiovascular;  Laterality: Right;   PERIPHERAL VASCULAR CATHETERIZATION Right 03/28/2016   Procedure: A/V Fistulagram;  Surgeon: Renford Dills, MD;  Location: [facility];  Service: Cardiovascular;  Laterality: Right;   PERIPHERAL VASCULAR CATHETERIZATION N/A 03/28/2016   Procedure: A/V Shunt Intervention;  Surgeon: Renford Dills, MD;  Location: [facility];  Service: Cardiovascular;  Laterality: N/A;   VASCULAR SURGERY Left 07/2015   stent in left thigh, Location:[place]    Family History  Problem Relation Age of Onset   Hypertension Mother    Polycystic kidney disease Mother    Hypertension Sister     Allergies  Allergen Reactions   Iodinated Contrast Media Other (See Comments)    hypotension   Oxycodone-Acetaminophen Itching       Latest Ref Rng & Units 02/14/2023    5:05 PM 01/14/2023    7:43 AM 01/13/2023    8:06 AM  CBC  WBC 4.0 - 10.5 K/uL 5.6  12.6  5.0   Hemoglobin 13.0 - 17.0 g/dL 16.1  09.6  04.5   Hematocrit 39.0 - 52.0 % 46.0  42.1  41.5   Platelets 150 - 400 K/uL  194  199  183       CMP     Component Value Date/Time   NA 134 (L) 02/14/2023 1705   K 4.8 02/14/2023 1705   CL 93 (L) 02/14/2023 1705   CO2 28 02/14/2023 1705   GLUCOSE 102 (H) 02/14/2023 1705   BUN 40 (H) 02/14/2023 1705  CREATININE 6.32 (H) 02/14/2023 1705   CALCIUM 10.2 02/14/2023 1705   PROT 7.4 07/02/2022 2217   ALBUMIN 3.8 01/14/2023 0743   AST 13 (L) 07/02/2022 2217   ALT 10 07/02/2022 2217   ALKPHOS 44 07/02/2022 2217   BILITOT 0.7 07/02/2022 2217   GFRNONAA 10 (L) 02/14/2023 1705     No results found.     Assessment & Plan:   1. Peripheral arterial disease with history of revascularization (HCC) (Primary) Today the patient's noninvasive studies have worsened slightly.  He does have claudication but nothing significant on tolerable at this point.  He also does not have rest pain or ulcerations.  Based on his it is certainly reasonable to have the patient proceed conservatively with close monitoring and follow-up.  He is advised however that if he has significant worsening of claudication or development of discolored lower extremities to contact us sooner.  He does have some underlying neuropathy and so numbness would not be abnormal for him.  2. ESRD on hemodialysis [facility]) Denies any issues with dialysis currently.  When he returns in 3 months we will have him undergo a HDA.  3. Essential hypertension Currently patient's blood pressure is elevated but he notes that he is working with his primary care nephrologist for control.  He does have an additional dose for blood pressure medication today which should help decrease his overall blood pressure.   Current Outpatient Medications on File Prior to Visit  Medication Sig Dispense Refill   amLODipine (NORVASC) 10 MG tablet Take 10 mg by mouth daily.     aspirin EC 81 MG tablet Take 1 tablet (81 mg total) by mouth daily. Swallow whole. 30 tablet 12   calcium acetate (PHOSLO) 667 MG tablet Take 2,001 mg by mouth 3  (three) times daily.     clopidogrel (PLAVIX) 75 MG tablet Take 1 tablet (75 mg total) by mouth daily. 30 tablet 11   furosemide (LASIX) 80 MG tablet Take 80 mg by mouth 2 (two) times daily.     lidocaine (LIDODERM) 5 % Place 1 patch onto the skin daily. Remove & Discard patch within 12 hours or as directed by MD 30 patch 0   midodrine (PROAMATINE) 10 MG tablet Take 1 tablet (10 mg total) by mouth daily as needed (Take 30 min prior to HD session). 30 tablet 0   naloxone (NARCAN) nasal spray 4 mg/0.1 mL 1 spray as directed.     Nutritional Supplements (FEEDING SUPPLEMENT, NEPRO CARB STEADY,) LIQD Take 237 mLs by mouth as needed (missed meal during dialysis.). 30 mL 0   RABEprazole (ACIPHEX) 20 MG tablet Take 20 mg by mouth daily.     simvastatin (ZOCOR) 10 MG tablet Take 10 mg by mouth daily.     VELTASSA 8.4 g packet Take 8.4 g by mouth daily.     No current facility-administered medications on file prior to visit.    There are no Patient Instructions on file for this visit. No follow-ups on file.   Georgiana Spinner, NP

## 2023-04-07 NOTE — Progress Notes (Incomplete)
 Subjective:    Patient ID: Oscar Moody., male    DOB: 1971-02-20, 53 y.o.   MRN: [id number] Chief Complaint  Patient presents with  . Follow-up    approx 1 month ABI +    The patient returns to the office for followup regarding atherosclerotic changes of the lower extremities and review of the noninvasive studies.   There have been no interval changes in lower extremity symptoms. No interval shortening of the patient's claudication distance or development of rest pain symptoms. No new ulcers or wounds have occurred since the last visit.  There have been no significant changes to the patient's overall health care.  The patient denies amaurosis fugax or recent TIA symptoms. There are no documented recent neurological changes noted. There is no history of DVT, PE or superficial thrombophlebitis. The patient denies recent episodes of angina or shortness of breath.   ABI Rt=0.70 and Lt=0.79  (previous ABI's Rt=0.85 and Lt=[place]) Duplex ultrasound of the bilateral lower extremities reveals biphasic/monophasic waveforms of the tibial vessels.    Review of Systems  Cardiovascular:  Negative for leg swelling.  Neurological:  Positive for numbness.  All other systems reviewed and are negative.      Objective:   Physical Exam Vitals reviewed.  HENT:     Head: Normocephalic.  Cardiovascular:     Rate and Rhythm: Normal rate.     Pulses:          Dorsalis pedis pulses are detected w/ Doppler on the right side and detected w/ Doppler on the left side.       Posterior tibial pulses are detected w/ Doppler on the right side and detected w/ Doppler on the left side.  Pulmonary:     Effort: Pulmonary effort is normal.  Skin:    General: Skin is warm and dry.     Capillary Refill: Capillary refill takes 2 to 3 seconds.  Neurological:     Mental Status: He is alert and oriented to person, place, and time.  Psychiatric:        Mood and Affect: Mood normal.        Behavior: Behavior  normal.        Thought Content: Thought content normal.        Judgment: Judgment normal.     BP (!) 190/76   Pulse 78   Resp 18   Ht 6' (1.829 m)   Wt 200 lb 6.4 oz (90.9 kg)   BMI 27.18 kg/m   Past Medical History:  Diagnosis Date  . Anxiety   . Arthritis   . Asthma    childhood asthma  . Chronic kidney disease   . Current every day smoker   . DVT (deep venous thrombosis) (HCC)   . Dyspnea    with exertion  . GERD (gastroesophageal reflux disease)   . Headache   . Hypercholesteremia   . Hypertension   . Peripheral vascular disease (HCC)     Social History   Socioeconomic History  . Marital status: Single    Spouse name: Not on file  . Number of children: Not on file  . Years of education: Not on file  . Highest education level: Not on file  Occupational History  . Not on file  Tobacco Use  . Smoking status: Every Day    Current packs/day: 0.25    Types: Cigarettes  . Smokeless tobacco: Never  Vaping Use  . Vaping status: Never Used  Substance and  Sexual Activity   Alcohol use: No   Drug use: Not Currently    Types: Marijuana    Comment: occassional (when out of pain medication)   Sexual activity: Never  Other Topics Concern   Not on file  Social History Narrative   Not on file   Social Drivers of Health   Financial Resource Strain: Not on file  Food Insecurity: No Food Insecurity (01/12/2023)   Hunger Vital Sign    Worried About Running Out of Food in the Last Year: Never true    Ran Out of Food in the Last Year: Never true  Transportation Needs: No Transportation Needs (01/12/2023)   PRAPARE - Administrator, Civil Service (Medical): No    Lack of Transportation (Non-Medical): No  Physical Activity: Not on file  Stress: Not on file  Social Connections: Not on file  Intimate Partner Violence: Not At Risk (01/12/2023)   Humiliation, Afraid, Rape, and Kick questionnaire    Fear of Current or Ex-Partner: No    Emotionally Abused: No    Physically  Abused: No    Sexually Abused: No    Past Surgical History:  Procedure Laterality Date   A/V FISTULAGRAM Right 06/10/2022   Procedure: A/V Fistulagram;  Surgeon: Renford Dills, MD;  Location: [facility];  Service: Cardiovascular;  Laterality: Right;   AV FISTULA PLACEMENT Right 12/26/2015   Procedure: ARTERIOVENOUS (AV) FISTULA CREATION ( RADIAL CEPHALIC );  Surgeon: Renford Dills, MD;  Location: [facility];  Service: Vascular;  Laterality: Right;   FRACTURE SURGERY Left    screw in left wrist   LOWER EXTREMITY ANGIOGRAPHY Right 07/30/2021   Procedure: Lower Extremity Angiography;  Surgeon: Renford Dills, MD;  Location: [facility];  Service: Cardiovascular;  Laterality: Right;   LOWER EXTREMITY ANGIOGRAPHY Right 01/13/2023   Procedure: Lower Extremity Angiography;  Surgeon: Renford Dills, MD;  Location: [facility];  Service: Cardiovascular;  Laterality: Right;   PERIPHERAL VASCULAR CATHETERIZATION Right 03/28/2016   Procedure: A/V Fistulagram;  Surgeon: Renford Dills, MD;  Location: [facility];  Service: Cardiovascular;  Laterality: Right;   PERIPHERAL VASCULAR CATHETERIZATION N/A 03/28/2016   Procedure: A/V Shunt Intervention;  Surgeon: Renford Dills, MD;  Location: [facility];  Service: Cardiovascular;  Laterality: N/A;   VASCULAR SURGERY Left 07/2015   stent in left thigh, Location:[place]    Family History  Problem Relation Age of Onset   Hypertension Mother    Polycystic kidney disease Mother    Hypertension Sister     Allergies  Allergen Reactions   Iodinated Contrast Media Other (See Comments)    hypotension   Oxycodone-Acetaminophen Itching       Latest Ref Rng & Units 02/14/2023    5:05 PM 01/14/2023    7:43 AM 01/13/2023    8:06 AM  CBC  WBC 4.0 - 10.5 K/uL 5.6  12.6  5.0   Hemoglobin 13.0 - 17.0 g/dL 16.1  09.6  04.5   Hematocrit 39.0 - 52.0 % 46.0  42.1  41.5   Platelets 150 - 400 K/uL  194  199  183       CMP     Component Value Date/Time   NA 134 (L) 02/14/2023 1705   K 4.8 02/14/2023 1705   CL 93 (L) 02/14/2023 1705   CO2 28 02/14/2023 1705   GLUCOSE 102 (H) 02/14/2023 1705   BUN 40 (H) 02/14/2023 1705  CREATININE 6.32 (H) 02/14/2023 1705   CALCIUM 10.2 02/14/2023 1705   PROT 7.4 07/02/2022 2217   ALBUMIN 3.8 01/14/2023 0743   AST 13 (L) 07/02/2022 2217   ALT 10 07/02/2022 2217   ALKPHOS 44 07/02/2022 2217   BILITOT 0.7 07/02/2022 2217   GFRNONAA 10 (L) 02/14/2023 1705     No results found.     Assessment & Plan:   1. Peripheral arterial disease with history of revascularization (HCC) (Primary) Today the patient's noninvasive studies have worsened slightly.  He does have claudication but nothing significant on tolerable at this point.  He also does not have rest pain or ulcerations.  Based on his it is certainly reasonable to have the patient proceed conservatively with close monitoring and follow-up.  He is advised however that if he has significant worsening of claudication or development of discolored lower extremities to contact us sooner.  He does have some underlying neuropathy and so numbness would not be abnormal for him.  2. ESRD on hemodialysis [facility]) Denies any issues with dialysis currently.  When he returns in 3 months we will have him undergo a HDA.  3. Essential hypertension Currently patient's blood pressure is elevated but he notes that he is working with his primary care nephrologist for control.  He does have an additional dose for blood pressure medication today which should help decrease his overall blood pressure.   Current Outpatient Medications on File Prior to Visit  Medication Sig Dispense Refill  . amLODipine (NORVASC) 10 MG tablet Take 10 mg by mouth daily.    Marland Kitchen aspirin EC 81 MG tablet Take 1 tablet (81 mg total) by mouth daily. Swallow whole. 30 tablet 12  . calcium  acetate (PHOSLO) 667 MG tablet Take 2,001 mg by mouth 3 (three) times daily.    . clopidogrel (PLAVIX) 75 MG tablet Take 1 tablet (75 mg total) by mouth daily. 30 tablet 11  . furosemide (LASIX) 80 MG tablet Take 80 mg by mouth 2 (two) times daily.    Marland Kitchen lidocaine (LIDODERM) 5 % Place 1 patch onto the skin daily. Remove & Discard patch within 12 hours or as directed by MD 30 patch 0  . midodrine (PROAMATINE) 10 MG tablet Take 1 tablet (10 mg total) by mouth daily as needed (Take 30 min prior to HD session). 30 tablet 0  . naloxone (NARCAN) nasal spray 4 mg/0.1 mL 1 spray as directed.    . Nutritional Supplements (FEEDING SUPPLEMENT, NEPRO CARB STEADY,) LIQD Take 237 mLs by mouth as needed (missed meal during dialysis.). 30 mL 0  . RABEprazole (ACIPHEX) 20 MG tablet Take 20 mg by mouth daily.    . simvastatin (ZOCOR) 10 MG tablet Take 10 mg by mouth daily.    . VELTASSA 8.4 g packet Take 8.4 g by mouth daily.     No current facility-administered medications on file prior to visit.    There are no Patient Instructions on file for this visit. No follow-ups on file.   Georgiana Spinner, NP

## 2023-04-09 LAB — VAS US ABI WITH/WO TBI
Left ABI: 0.79
Right ABI: 0.7

## 2023-05-27 ENCOUNTER — Telehealth (INDEPENDENT_AMBULATORY_CARE_PROVIDER_SITE_OTHER): Payer: Self-pay

## 2023-05-27 NOTE — Telephone Encounter (Signed)
 I left a message on the patient voicemail to return a call to the office pertaining to the message left on the nurse line

## 2023-06-15 ENCOUNTER — Other Ambulatory Visit
Admission: RE | Admit: 2023-06-15 | Discharge: 2023-06-15 | Disposition: A | Discharge status: Home / Self Care | Source: Ambulatory Visit | Attending: Nephrology | Admitting: Nephrology

## 2023-06-15 DIAGNOSIS — E875 Hyperkalemia: Secondary | ICD-10-CM | POA: Diagnosis present

## 2023-06-15 LAB — POTASSIUM: Potassium: 5.2 mmol/L — ABNORMAL HIGH (ref 3.5–5.1)

## 2023-07-08 ENCOUNTER — Other Ambulatory Visit (INDEPENDENT_AMBULATORY_CARE_PROVIDER_SITE_OTHER): Payer: Self-pay | Admitting: Nurse Practitioner

## 2023-07-08 DIAGNOSIS — Z9889 Other specified postprocedural states: Secondary | ICD-10-CM

## 2023-07-08 DIAGNOSIS — N186 End stage renal disease: Secondary | ICD-10-CM

## 2023-07-09 ENCOUNTER — Ambulatory Visit (INDEPENDENT_AMBULATORY_CARE_PROVIDER_SITE_OTHER): Payer: 59

## 2023-07-09 ENCOUNTER — Ambulatory Visit (INDEPENDENT_AMBULATORY_CARE_PROVIDER_SITE_OTHER): Payer: 59 | Admitting: Nurse Practitioner

## 2023-07-09 ENCOUNTER — Telehealth (INDEPENDENT_AMBULATORY_CARE_PROVIDER_SITE_OTHER): Payer: Self-pay

## 2023-07-09 ENCOUNTER — Encounter (INDEPENDENT_AMBULATORY_CARE_PROVIDER_SITE_OTHER): Payer: Self-pay | Admitting: Nurse Practitioner

## 2023-07-09 VITALS — BP 165/88 | HR 74 | Resp 18 | Ht 72.0 in | Wt 200.0 lb

## 2023-07-09 DIAGNOSIS — Z9889 Other specified postprocedural states: Secondary | ICD-10-CM

## 2023-07-09 DIAGNOSIS — N186 End stage renal disease: Secondary | ICD-10-CM | POA: Diagnosis not present

## 2023-07-09 DIAGNOSIS — I739 Peripheral vascular disease, unspecified: Secondary | ICD-10-CM | POA: Diagnosis not present

## 2023-07-09 DIAGNOSIS — I10 Essential (primary) hypertension: Secondary | ICD-10-CM

## 2023-07-09 NOTE — Telephone Encounter (Signed)
 I attempted to contact the patient to schedule him for a right arm fistulagram and bilateral leg angio's with Dr. Prescilla Brod. Unable to leave a message as the voice mail box is full. Patient has been scheduled on 07/21/23 for the fistulagram with a 1:00 pm arrival time to the [facility] and 07/28/23 and 08/04/23 for the bilateral leg angio's with a 9:00 am arrival time to  the [facility].

## 2023-07-13 LAB — VAS US ABI WITH/WO TBI
Left ABI: 0.82
Right ABI: 0.95

## 2023-07-13 NOTE — Progress Notes (Signed)
 Subjective:    Patient ID: Oscar Moody., male    DOB: [birth year], 53 y.o.   MRN: [id number] Chief Complaint  Patient presents with   Follow-up    F/u 3 months  ABI  HDA     The patient returns today for evaluation of his dialysis access as well as his peripheral arterial disease.  He notes he has been having worsening claudication with some mild rest pain like symptoms.  He has some numbness of his lower extremities however I suspect this is more so related to underlying neuropathy.  Avulsion concerns of his dialysis access.  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  He also notes some worsening bleeding as well.  Today noninvasive studies show that ABI 0.95 on the right and 0.82 on the left.  However it is noted that patient likely falsely elevated.  His previous ABI 0.70 on the left and 0.69.  He has a TBI 0.33 on the right and 0.30 on the left he also has monophasic tibial wave bilaterally with And toe waveforms.  He has a flow volume of 2040 for his fistula.  There is a stenotic area noted at the antecubital fossa area.    Review of Systems  Neurological:  Positive for numbness.  Hematological:  Bruises/bleeds easily.  All other systems reviewed and are negative.      Objective:    Physical Exam Vitals reviewed.  HENT:     Head: Normocephalic.  Cardiovascular:     Rate and Rhythm: Normal rate.  Pulmonary:     Effort: Pulmonary effort is normal.  Skin:    General: Skin is warm and dry.  Neurological:     Mental Status: He is alert and oriented to person, place, and time.  Psychiatric:        Mood and Affect: Mood normal.        Behavior: Behavior normal.        Thought Content: Thought content normal.        Judgment: Judgment normal.     BP (!) 165/88   Pulse 74   Resp 18   Ht 6' (1.829 m)   Wt 200 lb (90.7 kg)   BMI 27.12 kg/m   Past Medical History:  Diagnosis Date    Anxiety    Arthritis    Asthma    childhood asthma   Chronic kidney disease    Current every day smoker    DVT (deep venous thrombosis) (HCC)    Dyspnea    with exertion   GERD (gastroesophageal reflux disease)    Headache    Hypercholesteremia    Hypertension    Peripheral vascular disease (HCC)     Social History   Socioeconomic History   Marital status: Single    Spouse name: Not on file   Number of children: Not on file   Years of education: Not on file   Highest education level: Not on file  Occupational History   Not on file  Tobacco Use   Smoking status: Every Day    Current packs/day: 0.25    Types: Cigarettes   Smokeless tobacco: Never  Vaping Use   Vaping status: Never Used  Substance and Sexual Activity   Alcohol use: No   Drug use: Not Currently    Types: Marijuana    Comment: occassional (when out of pain medication)   Sexual activity: Never  Other Topics Concern   Not on file  Social History Narrative   Not on file   Social Drivers of Health   Financial Resource Strain: Not on file  Food Insecurity: No Food Insecurity (01/12/2023)   Hunger Vital Sign    Worried About Running Out of Food in the Last Year: Never true    Ran Out of Food in the Last Year: Never true  Transportation Needs: No Transportation Needs (01/12/2023)   PRAPARE - Administrator, Civil Service (Medical): No    Lack of Transportation (Non-Medical): No  Physical Activity: Not on file  Stress: Not on file  Social Connections: Not on file  Intimate Partner Violence: Not At Risk (01/12/2023)   Humiliation, Afraid, Rape, and Kick questionnaire    Fear of Current or Ex-Partner: No    Emotionally Abused: No    Physically Abused: No    Sexually Abused: No    Past Surgical History:  Procedure Laterality Date   A/V FISTULAGRAM Right 06/10/2022   Procedure: A/V Fistulagram;  Surgeon: Jackquelyn Mass, MD;  Location: [facility];  Service: Cardiovascular;   Laterality: Right;   AV FISTULA PLACEMENT Right 12/26/2015   Procedure: ARTERIOVENOUS (AV) FISTULA CREATION ( RADIAL CEPHALIC );  Surgeon: Jackquelyn Mass, MD;  Location: [facility];  Service: Vascular;  Laterality: Right;   FRACTURE SURGERY Left    screw in left wrist   LOWER EXTREMITY ANGIOGRAPHY Right 07/30/2021   Procedure: Lower Extremity Angiography;  Surgeon: Jackquelyn Mass, MD;  Location: [facility];  Service: Cardiovascular;  Laterality: Right;   LOWER EXTREMITY ANGIOGRAPHY Right 01/13/2023   Procedure: Lower Extremity Angiography;  Surgeon: Jackquelyn Mass, MD;  Location: [facility];  Service: Cardiovascular;  Laterality: Right;   PERIPHERAL VASCULAR CATHETERIZATION Right 03/28/2016   Procedure: A/V Fistulagram;  Surgeon: Jackquelyn Mass, MD;  Location: [facility];  Service: Cardiovascular;  Laterality: Right;   PERIPHERAL VASCULAR CATHETERIZATION N/A 03/28/2016   Procedure: A/V Shunt Intervention;  Surgeon: Jackquelyn Mass, MD;  Location: [facility];  Service: Cardiovascular;  Laterality: N/A;   VASCULAR SURGERY Left 07/2015   stent in left thigh, Location:[place]    Family History  Problem Relation Age of Onset   Hypertension Mother    Polycystic kidney disease Mother    Hypertension Sister     Allergies  Allergen Reactions   Iodinated Contrast Media Other (See Comments)    hypotension   Oxycodone-Acetaminophen  Itching       Latest Ref Rng & Units 02/14/2023    5:05 PM 01/14/2023    7:43 AM 01/13/2023    8:06 AM  CBC  WBC 4.0 - 10.5 K/uL 5.6  12.6  5.0   Hemoglobin 13.0 - 17.0 g/dL 91.4  78.2  95.6   Hematocrit 39.0 - 52.0 % 46.0  42.1  41.5   Platelets 150 - 400 K/uL 194  199  183        CMP     Component Value Date/Time   NA 134 (L) 02/14/2023 1705   K 5.2 (H) 06/15/2023 1045   CL 93 (L) 02/14/2023 1705   CO2 28 02/14/2023 1705   GLUCOSE 102 (H) 02/14/2023 1705   BUN 40 (H) 02/14/2023 1705    CREATININE 6.32 (H) 02/14/2023 1705   CALCIUM  10.2 02/14/2023 1705   PROT 7.4 07/02/2022 2217   ALBUMIN 3.8 01/14/2023 0743   AST 13 (L) 07/02/2022 2217  ALT 10 07/02/2022 2217   ALKPHOS 44 07/02/2022 2217   BILITOT 0.7 07/02/2022 2217   GFRNONAA 10 (L) 02/14/2023 1705     No results found.     Assessment & Plan:   1. ESRD (end stage renal disease) (HCC) (Primary) Recommend:  The patient is experiencing increasing problems with their dialysis access.  Patient should have a fistulagram with the intention for intervention.  The intention for intervention is to restore appropriate flow and prevent thrombosis and possible loss of the access.  As well as improve the quality of dialysis therapy.  The risks, benefits and alternative therapies were reviewed in detail with the patient.  All questions were answered.  The patient agrees to proceed with angio/intervention.    The patient will follow up with me in the office after the procedure.   2. Peripheral arterial disease with history of revascularization (HCC) Recommend:  The patient has experienced increased claudication symptoms and is now describing lifestyle limiting claudication and appears to be having mild rest pain symptroms.  Given the severity of the patient's severe  lower extremity symptoms the patient should undergo angiography with the hope for intervention.  Risk and benefits were reviewed the patient.  Indications for the procedure were reviewed.  All questions were answered, the patient agrees to proceed with right then left lower extremity angiography and possible intervention.   The patient should continue walking and begin a more formal exercise program.  The patient should continue antiplatelet therapy and aggressive treatment of the lipid abnormalities  The patient will follow up with me after the angiogram.   3. Essential hypertension Continue antihypertensive medications as already ordered, these  medications have been reviewed and there are no changes at this time.   Current Outpatient Medications on File Prior to Visit  Medication Sig Dispense Refill   aspirin  EC 81 MG tablet Take 1 tablet (81 mg total) by mouth daily. Swallow whole. 30 tablet 12   calcium  acetate (PHOSLO ) 667 MG tablet Take 2,001 mg by mouth 3 (three) times daily.     clopidogrel  (PLAVIX ) 75 MG tablet Take 1 tablet (75 mg total) by mouth daily. 30 tablet 11   esomeprazole (NEXIUM) 40 MG capsule Take 40 mg by mouth daily.     furosemide  (LASIX ) 80 MG tablet Take 80 mg by mouth 2 (two) times daily.     HYDROcodone -acetaminophen  (NORCO) 10-325 MG tablet Take 1 tablet by mouth every 6 (six) hours.     lactulose (CHRONULAC) 10 GM/15ML solution SMARTSIG:15 Milliliter(s) By Mouth Twice Daily PRN     lidocaine  (LIDODERM ) 5 % Place 1 patch onto the skin daily. Remove & Discard patch within 12 hours or as directed by MD 30 patch 0   lidocaine -prilocaine  (EMLA ) cream SMARTSIG:1 Topical Daily     losartan (COZAAR) 50 MG tablet Take 50 mg by mouth daily.     midodrine  (PROAMATINE ) 10 MG tablet Take 1 tablet (10 mg total) by mouth daily as needed (Take 30 min prior to HD session). 30 tablet 0   morphine  (MSIR) 15 MG tablet Take 15 mg by mouth every 6 (six) hours.     naloxone  (NARCAN ) nasal spray 4 mg/0.1 mL 1 spray as directed.     Nutritional Supplements (FEEDING SUPPLEMENT, NEPRO CARB STEADY,) LIQD Take 237 mLs by mouth as needed (missed meal during dialysis.). 30 mL 0   RABEprazole (ACIPHEX) 20 MG tablet Take 20 mg by mouth daily.     simvastatin  (ZOCOR ) 10 MG tablet  Take 10 mg by mouth daily.     VELTASSA  8.4 g packet Take 8.4 g by mouth daily.     amLODipine  (NORVASC ) 10 MG tablet Take 10 mg by mouth daily. (Patient not taking: Reported on 07/09/2023)     No current facility-administered medications on file prior to visit.    There are no Patient Instructions on file for this visit. No follow-ups on file.   Luca Dyar E  Tamerra Merkley, NP

## 2023-07-14 ENCOUNTER — Encounter (INDEPENDENT_AMBULATORY_CARE_PROVIDER_SITE_OTHER): Payer: Self-pay

## 2023-07-15 ENCOUNTER — Telehealth (INDEPENDENT_AMBULATORY_CARE_PROVIDER_SITE_OTHER): Payer: Self-pay

## 2023-07-15 NOTE — Telephone Encounter (Signed)
 Patient called and left a message wanting to reschedule his 08/04/23 RLE angio with Dr. Prescilla Brod. I attempted to contact the patient and the voicemail box is full. Patient will be moved to 08/11/23 for his RLE angio as I am unable to speak with the patient.

## 2023-07-21 ENCOUNTER — Encounter: Admission: RE | Payer: Self-pay | Source: Home / Self Care

## 2023-07-21 ENCOUNTER — Ambulatory Visit: Admission: RE | Admit: 2023-07-21 | Source: Home / Self Care | Admitting: Vascular Surgery

## 2023-07-21 DIAGNOSIS — N186 End stage renal disease: Secondary | ICD-10-CM

## 2023-07-21 SURGERY — A/V FISTULAGRAM
Anesthesia: Moderate Sedation | Laterality: Right

## 2023-07-23 ENCOUNTER — Other Ambulatory Visit (INDEPENDENT_AMBULATORY_CARE_PROVIDER_SITE_OTHER): Payer: Self-pay | Admitting: Vascular Surgery

## 2023-07-28 ENCOUNTER — Encounter: Admission: RE | Payer: Self-pay | Source: Home / Self Care

## 2023-07-28 ENCOUNTER — Ambulatory Visit: Admission: RE | Admit: 2023-07-28 | Source: Home / Self Care | Admitting: Vascular Surgery

## 2023-07-28 DIAGNOSIS — I70219 Atherosclerosis of native arteries of extremities with intermittent claudication, unspecified extremity: Secondary | ICD-10-CM

## 2023-07-28 SURGERY — LOWER EXTREMITY ANGIOGRAPHY
Anesthesia: Moderate Sedation | Site: Leg Lower | Laterality: Left

## 2023-08-04 DIAGNOSIS — I70219 Atherosclerosis of native arteries of extremities with intermittent claudication, unspecified extremity: Secondary | ICD-10-CM

## 2023-08-11 ENCOUNTER — Encounter: Payer: Self-pay | Admitting: Vascular Surgery

## 2023-08-11 ENCOUNTER — Other Ambulatory Visit: Payer: Self-pay

## 2023-08-11 ENCOUNTER — Ambulatory Visit
Admission: RE | Admit: 2023-08-11 | Discharge: 2023-08-11 | Disposition: A | Discharge status: Home / Self Care | Attending: Vascular Surgery | Admitting: Vascular Surgery

## 2023-08-11 ENCOUNTER — Encounter
Admission: RE | Disposition: A | Discharge status: Home / Self Care | Payer: Self-pay | Source: Home / Self Care | Attending: Vascular Surgery

## 2023-08-11 DIAGNOSIS — I70219 Atherosclerosis of native arteries of extremities with intermittent claudication, unspecified extremity: Secondary | ICD-10-CM

## 2023-08-11 DIAGNOSIS — Z9889 Other specified postprocedural states: Secondary | ICD-10-CM

## 2023-08-11 DIAGNOSIS — I700 Atherosclerosis of aorta: Secondary | ICD-10-CM | POA: Diagnosis not present

## 2023-08-11 DIAGNOSIS — I70223 Atherosclerosis of native arteries of extremities with rest pain, bilateral legs: Secondary | ICD-10-CM | POA: Diagnosis present

## 2023-08-11 DIAGNOSIS — I120 Hypertensive chronic kidney disease with stage 5 chronic kidney disease or end stage renal disease: Secondary | ICD-10-CM | POA: Insufficient documentation

## 2023-08-11 DIAGNOSIS — Z992 Dependence on renal dialysis: Secondary | ICD-10-CM | POA: Insufficient documentation

## 2023-08-11 DIAGNOSIS — T82856A Stenosis of peripheral vascular stent, initial encounter: Secondary | ICD-10-CM

## 2023-08-11 DIAGNOSIS — N186 End stage renal disease: Secondary | ICD-10-CM | POA: Diagnosis not present

## 2023-08-11 DIAGNOSIS — F17210 Nicotine dependence, cigarettes, uncomplicated: Secondary | ICD-10-CM | POA: Insufficient documentation

## 2023-08-11 HISTORY — PX: LOWER EXTREMITY ANGIOGRAPHY: CATH118251

## 2023-08-11 LAB — POTASSIUM: Potassium: 4.8 mmol/L (ref 3.5–5.1)

## 2023-08-11 LAB — POTASSIUM (ARMC VASCULAR LAB ONLY): Potassium (ARMC vascular lab): 7.3 mmol/L (ref 3.5–5.1)

## 2023-08-11 SURGERY — LOWER EXTREMITY ANGIOGRAPHY
Anesthesia: Moderate Sedation | Site: Leg Lower | Laterality: Right

## 2023-08-11 MED ORDER — HEPARIN (PORCINE) IN NACL 1000-0.9 UT/500ML-% IV SOLN
INTRAVENOUS | Status: DC | PRN
Start: 2023-08-11 — End: 2023-08-11
  Administered 2023-08-11: 1000 mL

## 2023-08-11 MED ORDER — SODIUM CHLORIDE 0.9 % IV SOLN: 250.0000 mL | INTRAVENOUS | Status: DC | PRN

## 2023-08-11 MED ORDER — FENTANYL CITRATE (PF) 100 MCG/2ML IJ SOLN
INTRAMUSCULAR | Status: DC | PRN
  Administered 2023-08-11: 12.5 ug via INTRAVENOUS
  Administered 2023-08-11: 50 ug via INTRAVENOUS
  Administered 2023-08-11: 12.5 ug via INTRAVENOUS

## 2023-08-11 MED ORDER — SODIUM CHLORIDE 0.9 % IV SOLN: INTRAVENOUS | Status: DC

## 2023-08-11 MED ORDER — SODIUM CHLORIDE 0.9% FLUSH: 3.0000 mL | INTRAVENOUS | Status: DC | PRN

## 2023-08-11 MED ORDER — HYDRALAZINE HCL 20 MG/ML IJ SOLN
5.0000 mg | INTRAMUSCULAR | Status: AC | PRN
  Administered 2023-08-11 (×2): 5 mg via INTRAVENOUS

## 2023-08-11 MED ORDER — ONDANSETRON HCL 4 MG/2ML IJ SOLN: 4.0000 mg | Freq: Four times a day (QID) | INTRAMUSCULAR | Status: DC | PRN

## 2023-08-11 MED ORDER — IODIXANOL 320 MG/ML IV SOLN
INTRAVENOUS | Status: DC | PRN
  Administered 2023-08-11: 75 mL via INTRA_ARTERIAL

## 2023-08-11 MED ORDER — HYDRALAZINE HCL 20 MG/ML IJ SOLN: 10.0000 mg | Freq: Once | INTRAMUSCULAR | Status: DC

## 2023-08-11 MED ORDER — CEFAZOLIN SODIUM-DEXTROSE 1-4 GM/50ML-% IV SOLN
1.0000 g | INTRAVENOUS | Status: AC
  Administered 2023-08-11: 1 g via INTRAVENOUS

## 2023-08-11 MED ORDER — FAMOTIDINE 20 MG PO TABS
ORAL_TABLET | ORAL | Status: AC
  Filled 2023-08-11: qty 1

## 2023-08-11 MED ORDER — MIDAZOLAM HCL 2 MG/ML PO SYRP
8.0000 mg | ORAL_SOLUTION | Freq: Once | ORAL | Status: DC | PRN
Start: 2023-08-11 — End: 2023-08-11

## 2023-08-11 MED ORDER — METHYLPREDNISOLONE SODIUM SUCC 125 MG IJ SOLR
INTRAMUSCULAR | Status: AC
  Filled 2023-08-11: qty 2

## 2023-08-11 MED ORDER — MIDAZOLAM HCL 5 MG/5ML IJ SOLN
INTRAMUSCULAR | Status: AC
  Filled 2023-08-11: qty 5

## 2023-08-11 MED ORDER — DIPHENHYDRAMINE HCL 50 MG/ML IJ SOLN
INTRAMUSCULAR | Status: AC
  Filled 2023-08-11: qty 1

## 2023-08-11 MED ORDER — METHYLPREDNISOLONE SODIUM SUCC 125 MG IJ SOLR
125.0000 mg | Freq: Once | INTRAMUSCULAR | Status: AC | PRN
Start: 2023-08-11 — End: 2023-08-11
  Administered 2023-08-11: 125 mg via INTRAVENOUS

## 2023-08-11 MED ORDER — OXYCODONE HCL 5 MG PO TABS: 5.0000 mg | ORAL_TABLET | ORAL | Status: DC | PRN

## 2023-08-11 MED ORDER — FAMOTIDINE 20 MG PO TABS
40.0000 mg | ORAL_TABLET | Freq: Once | ORAL | Status: AC | PRN
  Administered 2023-08-11: 40 mg via ORAL

## 2023-08-11 MED ORDER — HEPARIN SODIUM (PORCINE) 1000 UNIT/ML IJ SOLN
INTRAMUSCULAR | Status: AC
  Filled 2023-08-11: qty 10

## 2023-08-11 MED ORDER — HEPARIN (PORCINE) IN NACL 2000-0.9 UNIT/L-% IV SOLN
INTRAVENOUS | Status: DC | PRN
  Administered 2023-08-11: 1000 mL

## 2023-08-11 MED ORDER — CEFAZOLIN SODIUM-DEXTROSE 1-4 GM/50ML-% IV SOLN
INTRAVENOUS | Status: AC
  Filled 2023-08-11: qty 50

## 2023-08-11 MED ORDER — MORPHINE SULFATE (PF) 4 MG/ML IV SOLN: 2.0000 mg | INTRAVENOUS | Status: DC | PRN

## 2023-08-11 MED ORDER — SODIUM CHLORIDE 0.9 % IV SOLN
INTRAVENOUS | Status: DC
Start: 2023-08-11 — End: 2023-08-11

## 2023-08-11 MED ORDER — SODIUM CHLORIDE 0.9% FLUSH: 3.0000 mL | Freq: Two times a day (BID) | INTRAVENOUS | Status: DC

## 2023-08-11 MED ORDER — LIDOCAINE HCL (PF) 1 % IJ SOLN
INTRAMUSCULAR | Status: DC | PRN
  Administered 2023-08-11: 5 mL

## 2023-08-11 MED ORDER — LABETALOL HCL 5 MG/ML IV SOLN: 10.0000 mg | INTRAVENOUS | Status: DC | PRN

## 2023-08-11 MED ORDER — HYDRALAZINE HCL 20 MG/ML IJ SOLN
INTRAMUSCULAR | Status: AC
  Filled 2023-08-11: qty 1

## 2023-08-11 MED ORDER — HYDROMORPHONE HCL 1 MG/ML IJ SOLN: 1.0000 mg | Freq: Once | INTRAMUSCULAR | Status: DC | PRN

## 2023-08-11 MED ORDER — MIDAZOLAM HCL 2 MG/2ML IJ SOLN
INTRAMUSCULAR | Status: DC | PRN
  Administered 2023-08-11: 2 mg via INTRAVENOUS
  Administered 2023-08-11 (×2): .5 mg via INTRAVENOUS

## 2023-08-11 MED ORDER — DIPHENHYDRAMINE HCL 50 MG/ML IJ SOLN
50.0000 mg | Freq: Once | INTRAMUSCULAR | Status: AC | PRN
  Administered 2023-08-11: 50 mg via INTRAVENOUS

## 2023-08-11 MED ORDER — FENTANYL CITRATE (PF) 100 MCG/2ML IJ SOLN
INTRAMUSCULAR | Status: AC
Start: 2023-08-11 — End: 2023-08-11
  Filled 2023-08-11: qty 2

## 2023-08-11 MED ORDER — HEPARIN SODIUM (PORCINE) 1000 UNIT/ML IJ SOLN
INTRAMUSCULAR | Status: DC | PRN
  Administered 2023-08-11: 6000 [IU] via INTRAVENOUS

## 2023-08-11 SURGICAL SUPPLY — 29 items
BALLOON JADE .014 3.0 X 40 (BALLOONS) IMPLANT
BALLOON JADE .014 3.5 X 40 (BALLOONS) IMPLANT
BALLOON LUTONIX 018 4X100X130 (BALLOONS) IMPLANT
BALLOON LUTONIX 018 5X60X130 (BALLOONS) IMPLANT
BALLOON LUTONIX AV 10X40X75 (BALLOONS) IMPLANT
BALLOON LUTONIX AV 9X60X75 (BALLOONS) IMPLANT
BALLOON ULTRSCRE 014 3X40X150 (BALLOONS) IMPLANT
CATH ANGIO 5F PIGTAIL 65CM (CATHETERS) IMPLANT
CATH VERT 5FR 125CM (CATHETERS) IMPLANT
COVER PROBE ULTRASOUND 5X96 (MISCELLANEOUS) IMPLANT
DEVICE PRESTO INFLATION (MISCELLANEOUS) IMPLANT
DEVICE STARCLOSE SE CLOSURE (Vascular Products) IMPLANT
GLIDEWIRE ADV .035X260CM (WIRE) IMPLANT
GOWN STRL REUS W/ TWL LRG LVL3 (GOWN DISPOSABLE) ×1 IMPLANT
NDL ENTRY 21GA 7CM ECHOTIP (NEEDLE) IMPLANT
NEEDLE ENTRY 21GA 7CM ECHOTIP (NEEDLE) ×1 IMPLANT
PACK ANGIOGRAPHY (CUSTOM PROCEDURE TRAY) ×1 IMPLANT
SET INTRO CAPELLA COAXIAL (SET/KITS/TRAYS/PACK) IMPLANT
SHEATH BALKIN 7FR (SHEATH) IMPLANT
SHEATH BRITE TIP 5FRX11 (SHEATH) IMPLANT
SHEATH RAABE 6FR (SHEATH) IMPLANT
STENT ESPRIT BTK 3.0X38 SCAFF (Permanent Stent) IMPLANT
STENT LIFESTREAM 8X58X80 (Permanent Stent) IMPLANT
STENT LIFESTREAM 9X38X80 (Permanent Stent) IMPLANT
SUT MNCRL AB 4-0 PS2 18 (SUTURE) IMPLANT
SYR MEDRAD MARK 7 150ML (SYRINGE) IMPLANT
TUBING CONTRAST HIGH PRESS 72 (TUBING) IMPLANT
WIRE COMMAND ST STR 014 300 (WIRE) IMPLANT
WIRE J 3MM .035X145CM (WIRE) IMPLANT

## 2023-08-11 NOTE — Progress Notes (Signed)
 MRN : [id number]  Oscar Moody. is a 53 y.o. (Apr 26, 1970) male who presents with chief complaint of check circulation.  History of Present Illness:   Patient presents to [facility] for treatment atherosclerotic occlusive disease.  He was last seen in the office Jul 09, 2023.  At that time he noted a severe decrease in his claudication distance with development of rest pain like symptoms.  He is having increased pain in his feet bilaterally with the great left.  He has also follow-up for his end-stage renal disease on hemodialysis and is noted increasing problems with his dialysis access.  Duplex ultrasound of his access demonstrates a flow volume of 2040 for his fistula.  There is a stenotic area noted at the antecubital fossa area.   Today noninvasive studies show that ABI 0.95 on the right and 0.82 on the left. However it is noted that patient likely falsely elevated. His previous ABI 0.70 on the left and 0.69. He has a TBI 0.33 on the right and 0.30 on the left he also has monophasic tibial wave bilaterally with And toe waveforms.   Current Meds  Medication Sig   aspirin  EC 81 MG tablet Take 1 tablet (81 mg total) by mouth daily. Swallow whole.   calcium  acetate (PHOSLO ) 667 MG tablet Take 2,001 mg by mouth 3 (three) times daily.   clopidogrel  (PLAVIX ) 75 MG tablet TAKE 1 TABLET BY MOUTH EVERY DAY   esomeprazole (NEXIUM) 40 MG capsule Take 40 mg by mouth daily.   furosemide  (LASIX ) 80 MG tablet Take 80 mg by mouth 2 (two) times daily.   lactulose (CHRONULAC) 10 GM/15ML solution SMARTSIG:15 Milliliter(s) By Mouth Twice Daily PRN   lidocaine -prilocaine  (EMLA ) cream SMARTSIG:1 Topical Daily   losartan (COZAAR) 50 MG tablet Take 50 mg by mouth daily.   midodrine  (PROAMATINE ) 10 MG tablet Take 1 tablet (10 mg total) by mouth daily as needed (Take 30 min prior to HD session).   morphine  (MSIR) 15 MG  tablet Take 15 mg by mouth every 6 (six) hours.   RABEprazole (ACIPHEX) 20 MG tablet Take 20 mg by mouth daily.   simvastatin  (ZOCOR ) 10 MG tablet Take 10 mg by mouth daily.   VELTASSA  8.4 g packet Take 8.4 g by mouth daily.    Past Medical History:  Diagnosis Date   Anxiety    Arthritis    Asthma    childhood asthma   Chronic kidney disease    Current every day smoker    DVT (deep venous thrombosis) (HCC)    Dyspnea    with exertion   GERD (gastroesophageal reflux disease)    Headache    Hypercholesteremia    Hypertension    Peripheral vascular disease [facility])     Past Surgical History:  Procedure Laterality Date   A/V FISTULAGRAM Right 06/10/2022   Procedure: A/V Fistulagram;  Surgeon: Jackquelyn Mass, MD;  Location: [facility];  Service: Cardiovascular;  Laterality: Right;   AV FISTULA PLACEMENT Right 12/26/2015   Procedure: ARTERIOVENOUS (AV) FISTULA CREATION ( RADIAL CEPHALIC );  Surgeon:  Jackquelyn Mass, MD;  Location: [facility];  Service: Vascular;  Laterality: Right;   FRACTURE SURGERY Left    screw in left wrist   LOWER EXTREMITY ANGIOGRAPHY Right 07/30/2021   Procedure: Lower Extremity Angiography;  Surgeon: Jackquelyn Mass, MD;  Location: [facility];  Service: Cardiovascular;  Laterality: Right;   LOWER EXTREMITY ANGIOGRAPHY Right 01/13/2023   Procedure: Lower Extremity Angiography;  Surgeon: Jackquelyn Mass, MD;  Location: [facility];  Service: Cardiovascular;  Laterality: Right;   PERIPHERAL VASCULAR CATHETERIZATION Right 03/28/2016   Procedure: A/V Fistulagram;  Surgeon: Jackquelyn Mass, MD;  Location: [facility];  Service: Cardiovascular;  Laterality: Right;   PERIPHERAL VASCULAR CATHETERIZATION N/A 03/28/2016   Procedure: A/V Shunt Intervention;  Surgeon: Jackquelyn Mass, MD;  Location: [facility];  Service: Cardiovascular;  Laterality: N/A;   VASCULAR SURGERY Left 07/2015   stent in left thigh,  Location:[place]    Social History Social History   Tobacco Use   Smoking status: Every Day    Current packs/day: 0.25    Types: Cigarettes   Smokeless tobacco: Never  Vaping Use   Vaping status: Never Used  Substance Use Topics   Alcohol use: No   Drug use: Not Currently    Family History Family History  Problem Relation Age of Onset   Hypertension Mother    Polycystic kidney disease Mother    Hypertension Sister     Allergies  Allergen Reactions   Iodinated Contrast Media Other (See Comments)    hypotension   Oxycodone-Acetaminophen  Itching     REVIEW OF SYSTEMS (Negative unless checked)  Constitutional: [] Weight loss  [] Fever  [] Chills Cardiac: [] Chest pain   [] Chest pressure   [] Palpitations   [] Shortness of breath when laying flat   [] Shortness of breath with exertion. Vascular:  [x] Pain in legs with walking   [] Pain in legs at rest  [] History of DVT   [] Phlebitis   [] Swelling in legs   [] Varicose veins   [] Non-healing ulcers Pulmonary:   [] Uses home oxygen   [] Productive cough   [] Hemoptysis   [] Wheeze  [] COPD   [] Asthma Neurologic:  [] Dizziness   [] Seizures   [] History of stroke   [] History of TIA  [] Aphasia   [] Vissual changes   [] Weakness or numbness in arm   [] Weakness or numbness in leg Musculoskeletal:   [] Joint swelling   [] Joint pain   [] Low back pain Hematologic:  [] Easy bruising  [] Easy bleeding   [] Hypercoagulable state   [] Anemic Gastrointestinal:  [] Diarrhea   [] Vomiting  [] Gastroesophageal reflux/heartburn   [] Difficulty swallowing. Genitourinary:  [] Chronic kidney disease   [] Difficult urination  [] Frequent urination   [] Blood in urine Skin:  [] Rashes   [] Ulcers  Psychological:  [] History of anxiety   []  History of major depression.  Physical Examination  Vitals:   08/11/23 0734  BP: (!) 171/78  Pulse: 75  Resp: 10  Temp: 97.8 F (36.6 C)  TempSrc: Oral  SpO2: 97%  Weight: 91.3 kg  Height: 5' 11 (1.803 m)   Body mass index is  28.08 kg/m. Gen: WD/WN, NAD Head: [place]/AT, No temporalis wasting.  Ear/Nose/Throat: Hearing grossly intact, nares w/o erythema or drainage Eyes: PER, EOMI, sclera nonicteric.  Neck: Supple, no masses.  No bruit or JVD.  Pulmonary:  Good air movement, no audible wheezing, no use of accessory muscles.  Cardiac: RRR, normal S1, S2, no Murmurs. 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 No guarding/no peritoneal signs.  Musculoskeletal: M/S 5/5 throughout.  No visible deformity.  Neurologic: CN 2-12 intact. Pain and light touch intact in extremities.  Symmetrical.  Speech is fluent. Motor exam as listed above. Psychiatric: Judgment intact, Mood & affect appropriate for pt's clinical situation. Dermatologic: No rashes or ulcers noted.  No changes consistent with cellulitis.   CBC Lab Results  Component Value Date   WBC 5.6 02/14/2023   HGB 14.1 02/14/2023   HCT 46.0 02/14/2023   MCV 94.1 02/14/2023   PLT 194 02/14/2023    BMET    Component Value Date/Time   NA 134 (L) 02/14/2023 1705   K 4.8 08/11/2023 0724   CL 93 (L) 02/14/2023 1705   CO2 28 02/14/2023 1705   GLUCOSE 102 (H) 02/14/2023 1705   BUN 40 (H) 02/14/2023 1705   CREATININE 6.32 (H) 02/14/2023 1705   CALCIUM  10.2 02/14/2023 1705   GFRNONAA 10 (L) 02/14/2023 1705   GFRAA 12 (L) 03/28/2016 0941   CrCl cannot be calculated (Patient's most recent lab result is older than the maximum 21 days allowed.).  COAG Lab Results  Component Value Date   INR 1.1 01/11/2023   INR 1.06 12/11/2015    Radiology No results found.   Assessment/Plan 1. Peripheral arterial disease with history of revascularization (HCC) Recommend:   The patient has experienced increased  claudication symptoms and is now describing lifestyle limiting claudication and appears to be having mild rest pain symptroms.   Given the severity of the patient's severe  lower extremity symptoms the patient should undergo angiography with the hope for intervention.  Risk and benefits were reviewed the patient.  Indications for the procedure were reviewed.  All questions were answered, the patient agrees to proceed with right then left lower extremity angiography and possible intervention.    The patient should continue walking and begin a more formal exercise program.  The patient should continue antiplatelet therapy and aggressive treatment of the lipid abnormalities   The patient will follow up with me after the angiogram.    2. ESRD (end stage renal disease) (HCC) (Primary) Recommend:   The patient is experiencing increasing problems with their dialysis access.   Patient should have a fistulagram with the intention for intervention.  The intention for intervention is to restore appropriate flow and prevent thrombosis and possible loss of the access.  As well as improve the quality of dialysis therapy.   The risks, benefits and alternative therapies were reviewed in detail with the patient.  All questions were answered.  The patient agrees to proceed with angio/intervention.     The patient will follow up with me in the office after the procedure.     3. Essential hypertension Continue antihypertensive medications as already ordered, these medications have been reviewed and there are no changes at this time.     Devon Fogo, MD  08/11/2023 8:30 AM

## 2023-08-11 NOTE — Op Note (Signed)
 [facility]  Percutaneous Study/Intervention Procedural Note   Date of Surgery: 08/11/2023  Surgeon:  Devon Fogo  Pre-operative Diagnosis: Atherosclerotic occlusive disease bilateral lower extremities with bilateral lower extremity with rest pain.  Post-operative diagnosis:  Same  Procedure(s) Performed:             1.  Introduction catheter into right lower extremity 3rd order catheter placement               2.    Contrast injection right lower extremity for distal runoff             3.  Percutaneous transluminal angioplasty right superficial femoral and popliteal arteries to 5 mm             4.  Percutaneous transluminal angioplasty and stent placement right tibioperoneal trunk             5.  Percutaneous transluminal angioplasty and stent placement right common iliac artery to the 10 mm with Lifestream stent              6.  Percutaneous transluminal angioplasty and stent placement left common iliac artery millimeters with a Lifestream stent               7.  Star close closure left common femoral arteriotomy  Anesthesia: Conscious sedation was administered under my direct supervision by the interventional radiology RN. IV Versed  plus fentanyl  were utilized. Continuous ECG, pulse oximetry and blood pressure was monitored throughout the entire procedure.  Conscious sedation was for a total of 85 minutes.  Sheath: Combination of a 6 [place] Rabie sheath for the right lower extremity interventions and then a 7 [place] Balkan sheath for the iliac interventions same access retrograde left common femoral  Contrast: 75 cc  Fluoroscopy Time: 1.8 minutes  Indications:  Oscar Moody. presents with increasing pain of the bilateral lower extremity.  Noninvasive studies as well as physical examination demonstrate a significant deterioration in his arterial status since his previous visit.  This suggests the patient is progressing and is having changes consistent  with limb threatening ischemia.  Angiography with the hope for intervention has been recommended.  The risks and benefits are reviewed all questions answered patient agrees to proceed.  Procedure:   Oscar Moody. is a 53 y.o. y.o. male who was identified and appropriate procedural time out was performed.  The patient was then placed supine on the table and prepped and draped in the usual sterile fashion.    Ultrasound was placed in the sterile sleeve and the left groin was evaluated the left common femoral artery was echolucent and pulsatile indicating patency.  Image was recorded for the permanent record and under real-time visualization a microneedle was inserted into the common femoral artery followed by the microwire and then the micro-sheath.  A J-wire was then advanced through the micro-sheath and a  5 [place] sheath was then inserted over a J-wire. J-wire was then advanced and a 5 French pigtail catheter was positioned at the level of T12.  AP projection of the aorta was then obtained. Pigtail catheter was repositioned to above the bifurcation and a bilateral oblique views of the pelvis were obtained.  Subsequently a pigtail catheter with an Advantage wire was used to [employer] the aortic bifurcation.  The catheter and wire were advanced down into the right distal external iliac artery. Oblique view of the femoral bifurcation was then obtained and subsequently the wire was reintroduced  and the pigtail catheter negotiated into the SFA representing third order catheter placement. Distal runoff was then performed.  6000 units of heparin  was then given and allowed to circulate for several minutes.  A 6 French Rabie sheath was advanced up and over the bifurcation and positioned in the mid superficial femoral artery  KMP catheter and advantage Glidewire were then negotiated down into the distal popliteal. Catheter was then advanced. Hand injection contrast demonstrated the trifurcation and tibial anatomy in  further detail (multiple oblique views were required given the hardware in his proximal tibia).  A 0.014 wire was then exchanged for the 035 wire and positioned with its tip in the mid posterior tibial artery.  A 3 mm x 40 mm Ultraverse balloon was used to angioplasty the tibioperoneal trunk.  The inflation was for 1 minute at 12 atm. Follow-up imaging demonstrated patency but greater than 50% residual stenosis.  Given this finding a 3 mm 38 mm Esprit stent was advanced across the lesion and inflated to spears for 30 seconds.  Following this inflation a 3.5 mm x 40 mm Jade balloon was advanced cross inflation 10 atm for 45 seconds.  Follow-up imaging demonstrated marked improvement in the tibioperoneal trunk with less than 10% residual stenosis.  Attention was then turned to the mid popliteal and distal SFA lesions.  The detector was then positioned proximally. A 4 mm x 100 mm Lutonix balloon was advanced across the mid popliteal lesion and the in-stent restenosis in the distal SFA.  Inflation was to 12 atmospheres for 1 minute. Follow-up imaging demonstrated patency with greater than 50% residual stenosis within the SFA stent and less than 10% residual stenosis within the mid popliteal lesion.  A 5 mm x 60 mm Lutonix drug-eluting balloon was then advanced across the stent inflated to 12 atm for 1 minute.  Distal runoff was then reassessed and the distal SFA is now noted to be widely patent with less than 10% residual stenosis.  The 014 wire is then exchanged for the 035 advantage.  The detector was repositioned to the aortic bifurcation and the main Rabie sheath was removed and a 7 [place] is advanced under fluoroscopic guidance and positioned with its tip in the right common iliac artery.  Hand-injection of contrast is of the and after appropriate measurements 9 mm x 38 mm Lifestream stent is selected is deployed so that the leading edge is just a few millimeters distal from the origin.  It is  postdilated with a 10 mm x 40 mm balloon inflated to the 8 atm for approximately 1 minute.  Follow-up imaging demonstrates wide patency of the common iliac artery less than 10% residual stenosis with preservation of the iliac bifurcation.  The sheath was then pulled into the distal left common iliac artery and hand-injection of contrast is used to create an image of the left common iliac artery.  An 8 mm x 58 mm Lifestream stent was selected and then deployed.  It is postdilated with a 9 x 60 mm inflated to 10 atm for 30 seconds.  Follow-up imaging showed wide patency with preservation of the iliac bifurcation less than 10% residual stenosis.  After review of these images the sheath is pulled into the distal left external iliac oblique of the common femoral is obtained and a Star close device deployed. There no immediate Complications.  Findings:  The abdominal aorta is opacified with a bolus injection contrast. Renal arteries are single and widely patent without evidence of hemodynamically  significant stenosis.  The aorta itself has diffuse disease but no hemodynamically significant lesions. The common iliac arteries demonstrate greater than 70% stenosis bilaterally the right best seen in the RAO projection in the left best seen in the AP.  And external iliac arteries are diffusely diseased but do not appear to have lesion bilaterally.  The right common femoral is widely patent as is the profunda femoris.  The SFA is notable for previous stent in the proximal one third extending to the mid SFA.  This is widely patent.  There is a distal stent in the proximal canal which appears to have greater than 70% in-stent restenosis.  The mid popliteal appears to have 70% stenosis at the level of the tibial plateau.  The anterior tibial is patent at its origin but essentially occludes in its midportion and does not reconstitute.  There is a greater than 80% stenosis in the TP trunk from its origin to its midportion.   Peroneal and posterior tibial.  Patent and are free of a hemodynamically significant stenosis down to the foot.    Following angioplasty and stent placement of the tibioperoneal trunk there is now less than 10% residual stenosis with preservation of the two-vessel runoff to the foot. Angioplasty of the SFA at Hunter's canal and the mid popliteal yields an excellent result with less than 10% residual stenosis.  Angioplasty of the common iliac arteries with stent placement yields an excellent result with less than 10% residual stenosis  Summary: Successful recanalization bilateral lower extremity for limb salvage                           Disposition: Patient was taken to the recovery room in stable condition having tolerated the procedure well.  Theotis Flake Rainah Kirshner 08/11/2023,10:14 AM

## 2023-08-11 NOTE — Interval H&P Note (Signed)
 History and Physical Interval Note:  08/11/2023 8:36 AM  [address]  has presented today for surgery, with the diagnosis of RLE Angio    ASO w claudication.  The various methods of treatment have been discussed with the patient and family. After consideration of risks, benefits and other options for treatment, the patient has consented to  Procedure(s): Lower Extremity Angiography (Right) as a surgical intervention.  The patient's history has been reviewed, patient examined, no change in status, stable for surgery.  I have reviewed the patient's chart and labs.  Questions were answered to the patient's satisfaction.     Devon Fogo

## 2023-08-11 NOTE — Discharge Instructions (Signed)

## 2023-08-11 NOTE — H&P (View-Only) (Signed)
 MRN : [id number]  Keeghan Bialy. is a 53 y.o. (Apr 26, 1970) male who presents with chief complaint of check circulation.  History of Present Illness:   Patient presents to [facility] for treatment atherosclerotic occlusive disease.  He was last seen in the office Jul 09, 2023.  At that time he noted a severe decrease in his claudication distance with development of rest pain like symptoms.  He is having increased pain in his feet bilaterally with the great left.  He has also follow-up for his end-stage renal disease on hemodialysis and is noted increasing problems with his dialysis access.  Duplex ultrasound of his access demonstrates a flow volume of 2040 for his fistula.  There is a stenotic area noted at the antecubital fossa area.   Today noninvasive studies show that ABI 0.95 on the right and 0.82 on the left. However it is noted that patient likely falsely elevated. His previous ABI 0.70 on the left and 0.69. He has a TBI 0.33 on the right and 0.30 on the left he also has monophasic tibial wave bilaterally with And toe waveforms.   Current Meds  Medication Sig   aspirin  EC 81 MG tablet Take 1 tablet (81 mg total) by mouth daily. Swallow whole.   calcium  acetate (PHOSLO ) 667 MG tablet Take 2,001 mg by mouth 3 (three) times daily.   clopidogrel  (PLAVIX ) 75 MG tablet TAKE 1 TABLET BY MOUTH EVERY DAY   esomeprazole (NEXIUM) 40 MG capsule Take 40 mg by mouth daily.   furosemide  (LASIX ) 80 MG tablet Take 80 mg by mouth 2 (two) times daily.   lactulose (CHRONULAC) 10 GM/15ML solution SMARTSIG:15 Milliliter(s) By Mouth Twice Daily PRN   lidocaine -prilocaine  (EMLA ) cream SMARTSIG:1 Topical Daily   losartan (COZAAR) 50 MG tablet Take 50 mg by mouth daily.   midodrine  (PROAMATINE ) 10 MG tablet Take 1 tablet (10 mg total) by mouth daily as needed (Take 30 min prior to HD session).   morphine  (MSIR) 15 MG  tablet Take 15 mg by mouth every 6 (six) hours.   RABEprazole (ACIPHEX) 20 MG tablet Take 20 mg by mouth daily.   simvastatin  (ZOCOR ) 10 MG tablet Take 10 mg by mouth daily.   VELTASSA  8.4 g packet Take 8.4 g by mouth daily.    Past Medical History:  Diagnosis Date   Anxiety    Arthritis    Asthma    childhood asthma   Chronic kidney disease    Current every day smoker    DVT (deep venous thrombosis) (HCC)    Dyspnea    with exertion   GERD (gastroesophageal reflux disease)    Headache    Hypercholesteremia    Hypertension    Peripheral vascular disease [facility])     Past Surgical History:  Procedure Laterality Date   A/V FISTULAGRAM Right 06/10/2022   Procedure: A/V Fistulagram;  Surgeon: Jackquelyn Mass, MD;  Location: [facility];  Service: Cardiovascular;  Laterality: Right;   AV FISTULA PLACEMENT Right 12/26/2015   Procedure: ARTERIOVENOUS (AV) FISTULA CREATION ( RADIAL CEPHALIC );  Surgeon:  Jackquelyn Mass, MD;  Location: [facility] ORS;  Service: Vascular;  Laterality: Right;   FRACTURE SURGERY Left    screw in left wrist   LOWER EXTREMITY ANGIOGRAPHY Right 07/30/2021   Procedure: Lower Extremity Angiography;  Surgeon: Jackquelyn Mass, MD;  Location: [facility];  Service: Cardiovascular;  Laterality: Right;   LOWER EXTREMITY ANGIOGRAPHY Right 01/13/2023   Procedure: Lower Extremity Angiography;  Surgeon: Jackquelyn Mass, MD;  Location: [facility];  Service: Cardiovascular;  Laterality: Right;   PERIPHERAL VASCULAR CATHETERIZATION Right 03/28/2016   Procedure: A/V Fistulagram;  Surgeon: Jackquelyn Mass, MD;  Location: [facility];  Service: Cardiovascular;  Laterality: Right;   PERIPHERAL VASCULAR CATHETERIZATION N/A 03/28/2016   Procedure: A/V Shunt Intervention;  Surgeon: Jackquelyn Mass, MD;  Location: [facility];  Service: Cardiovascular;  Laterality: N/A;   VASCULAR SURGERY Left 07/2015   stent in left thigh,  Location:[place]    Social History Social History   Tobacco Use   Smoking status: Every Day    Current packs/day: 0.25    Types: Cigarettes   Smokeless tobacco: Never  Vaping Use   Vaping status: Never Used  Substance Use Topics   Alcohol use: No   Drug use: Not Currently    Family History Family History  Problem Relation Age of Onset   Hypertension Mother    Polycystic kidney disease Mother    Hypertension Sister     Allergies  Allergen Reactions   Iodinated Contrast Media Other (See Comments)    hypotension   Oxycodone-Acetaminophen  Itching     REVIEW OF SYSTEMS (Negative unless checked)  Constitutional: [] Weight loss  [] Fever  [] Chills Cardiac: [] Chest pain   [] Chest pressure   [] Palpitations   [] Shortness of breath when laying flat   [] Shortness of breath with exertion. Vascular:  [x] Pain in legs with walking   [] Pain in legs at rest  [] History of DVT   [] Phlebitis   [] Swelling in legs   [] Varicose veins   [] Non-healing ulcers Pulmonary:   [] Uses home oxygen   [] Productive cough   [] Hemoptysis   [] Wheeze  [] COPD   [] Asthma Neurologic:  [] Dizziness   [] Seizures   [] History of stroke   [] History of TIA  [] Aphasia   [] Vissual changes   [] Weakness or numbness in arm   [] Weakness or numbness in leg Musculoskeletal:   [] Joint swelling   [] Joint pain   [] Low back pain Hematologic:  [] Easy bruising  [] Easy bleeding   [] Hypercoagulable state   [] Anemic Gastrointestinal:  [] Diarrhea   [] Vomiting  [] Gastroesophageal reflux/heartburn   [] Difficulty swallowing. Genitourinary:  [] Chronic kidney disease   [] Difficult urination  [] Frequent urination   [] Blood in urine Skin:  [] Rashes   [] Ulcers  Psychological:  [] History of anxiety   []  History of major depression.  Physical Examination  Vitals:   08/11/23 0734  BP: (!) 171/78  Pulse: 75  Resp: 10  Temp: 97.8 F (36.6 C)  TempSrc: Oral  SpO2: 97%  Weight: 91.3 kg  Height: 5' 11 (1.803 m)   Body mass index is  28.08 kg/m. Gen: WD/WN, NAD Head: [place]/AT, No temporalis wasting.  Ear/Nose/Throat: Hearing grossly intact, nares w/o erythema or drainage Eyes: PER, EOMI, sclera nonicteric.  Neck: Supple, no masses.  No bruit or JVD.  Pulmonary:  Good air movement, no audible wheezing, no use of accessory muscles.  Cardiac: RRR, normal S1, S2, no Murmurs. 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 No guarding/no peritoneal signs.  Musculoskeletal: M/S 5/5 throughout.  No visible deformity.  Neurologic: CN 2-12 intact. Pain and light touch intact in extremities.  Symmetrical.  Speech is fluent. Motor exam as listed above. Psychiatric: Judgment intact, Mood & affect appropriate for pt's clinical situation. Dermatologic: No rashes or ulcers noted.  No changes consistent with cellulitis.   CBC Lab Results  Component Value Date   WBC 5.6 02/14/2023   HGB 14.1 02/14/2023   HCT 46.0 02/14/2023   MCV 94.1 02/14/2023   PLT 194 02/14/2023    BMET    Component Value Date/Time   NA 134 (L) 02/14/2023 1705   K 4.8 08/11/2023 0724   CL 93 (L) 02/14/2023 1705   CO2 28 02/14/2023 1705   GLUCOSE 102 (H) 02/14/2023 1705   BUN 40 (H) 02/14/2023 1705   CREATININE 6.32 (H) 02/14/2023 1705   CALCIUM  10.2 02/14/2023 1705   GFRNONAA 10 (L) 02/14/2023 1705   GFRAA 12 (L) 03/28/2016 0941   CrCl cannot be calculated (Patient's most recent lab result is older than the maximum 21 days allowed.).  COAG Lab Results  Component Value Date   INR 1.1 01/11/2023   INR 1.06 12/11/2015    Radiology No results found.   Assessment/Plan 1. Peripheral arterial disease with history of revascularization (HCC) Recommend:   The patient has experienced increased  claudication symptoms and is now describing lifestyle limiting claudication and appears to be having mild rest pain symptroms.   Given the severity of the patient's severe  lower extremity symptoms the patient should undergo angiography with the hope for intervention.  Risk and benefits were reviewed the patient.  Indications for the procedure were reviewed.  All questions were answered, the patient agrees to proceed with right then left lower extremity angiography and possible intervention.    The patient should continue walking and begin a more formal exercise program.  The patient should continue antiplatelet therapy and aggressive treatment of the lipid abnormalities   The patient will follow up with me after the angiogram.    2. ESRD (end stage renal disease) (HCC) (Primary) Recommend:   The patient is experiencing increasing problems with their dialysis access.   Patient should have a fistulagram with the intention for intervention.  The intention for intervention is to restore appropriate flow and prevent thrombosis and possible loss of the access.  As well as improve the quality of dialysis therapy.   The risks, benefits and alternative therapies were reviewed in detail with the patient.  All questions were answered.  The patient agrees to proceed with angio/intervention.     The patient will follow up with me in the office after the procedure.     3. Essential hypertension Continue antihypertensive medications as already ordered, these medications have been reviewed and there are no changes at this time.     Devon Fogo, MD  08/11/2023 8:30 AM

## 2023-08-25 LAB — COLOGUARD

## 2023-09-04 ENCOUNTER — Other Ambulatory Visit (INDEPENDENT_AMBULATORY_CARE_PROVIDER_SITE_OTHER): Payer: Self-pay | Admitting: Vascular Surgery

## 2023-09-04 DIAGNOSIS — Z9889 Other specified postprocedural states: Secondary | ICD-10-CM

## 2023-09-08 ENCOUNTER — Ambulatory Visit (INDEPENDENT_AMBULATORY_CARE_PROVIDER_SITE_OTHER): Admitting: Nurse Practitioner

## 2023-09-08 ENCOUNTER — Encounter (INDEPENDENT_AMBULATORY_CARE_PROVIDER_SITE_OTHER): Payer: Self-pay | Admitting: Nurse Practitioner

## 2023-09-08 ENCOUNTER — Ambulatory Visit (INDEPENDENT_AMBULATORY_CARE_PROVIDER_SITE_OTHER)

## 2023-09-08 VITALS — BP 144/85 | HR 80 | Ht 72.0 in | Wt 199.0 lb

## 2023-09-08 DIAGNOSIS — Z9889 Other specified postprocedural states: Secondary | ICD-10-CM | POA: Diagnosis not present

## 2023-09-08 DIAGNOSIS — I10 Essential (primary) hypertension: Secondary | ICD-10-CM | POA: Diagnosis not present

## 2023-09-08 DIAGNOSIS — I739 Peripheral vascular disease, unspecified: Secondary | ICD-10-CM

## 2023-09-08 DIAGNOSIS — N186 End stage renal disease: Secondary | ICD-10-CM

## 2023-09-08 NOTE — Progress Notes (Signed)
 Subjective:    Patient ID: Oscar Moody., male    DOB: 1970-04-19, 53 y.o.   MRN: [id number] Chief Complaint  Patient presents with   Follow-Ups: Follow up with Schnier, Cordella MATSU, MD (Vascular    The patient returns to the office for followup and review status post angiogram with intervention on 08/11/2023  Procedure:  Procedure(s) Performed:             1.  Introduction catheter into right lower extremity 3rd order catheter placement               2.    Contrast injection right lower extremity for distal runoff             3.  Percutaneous transluminal angioplasty right superficial femoral and popliteal arteries to 5 mm             4.  Percutaneous transluminal angioplasty and stent placement right tibioperoneal trunk             5.  Percutaneous transluminal angioplasty and stent placement right common iliac artery to the 10 mm with Lifestream stent              6.  Percutaneous transluminal angioplasty and stent placement left common iliac artery millimeters with a Lifestream stent               7.  Star close closure left common femoral arteriotomy   The patient notes improvement in the lower extremity symptoms. No interval shortening of the patient's claudication distance or rest pain symptoms. No new ulcers or wounds have occurred since the last visit.  He notes that his legs feel much better except for his issues with neuropathy which he is currently receiving injections for.  There have been no significant changes to the patient's overall health care.  No documented history of amaurosis fugax or recent TIA symptoms. There are no recent neurological changes noted. No documented history of DVT, PE or superficial thrombophlebitis. The patient denies recent episodes of angina or shortness of breath.   ABI's Rt=1.01 and Lt=1.02  (previous ABI's Rt=0.95 and Lt=0.82) Duplex US  of the bilateral tibial shows multiphasic waveforms with good toe waveforms bilaterally    Review  of Systems  Cardiovascular:  Positive for leg swelling.  Hematological:  Does not bruise/bleed easily.  All other systems reviewed and are negative.      Objective:   Physical Exam Vitals reviewed.  HENT:     Head: Normocephalic.  Cardiovascular:     Rate and Rhythm: Normal rate.     Pulses:          Dorsalis pedis pulses are detected w/ Doppler on the right side and detected w/ Doppler on the left side.       Posterior tibial pulses are detected w/ Doppler on the right side and detected w/ Doppler on the left side.  Pulmonary:     Effort: Pulmonary effort is normal.  Skin:    General: Skin is warm and dry.  Neurological:     Mental Status: He is alert and oriented to person, place, and time.  Psychiatric:        Mood and Affect: Mood normal.        Behavior: Behavior normal.        Thought Content: Thought content normal.        Judgment: Judgment normal.     BP (!) 144/85   Pulse 80   Ht 6' (  1.829 m)   Wt 199 lb (90.3 kg)   BMI 26.99 kg/m   Past Medical History:  Diagnosis Date   Anxiety    Arthritis    Asthma    childhood asthma   Chronic kidney disease    Current every day smoker    DVT (deep venous thrombosis) (HCC)    Dyspnea    with exertion   GERD (gastroesophageal reflux disease)    Headache    Hypercholesteremia    Hypertension    Peripheral vascular disease (HCC)     Social History   Socioeconomic History   Marital status: Married    Spouse name: Not on file   Number of children: Not on file   Years of education: Not on file   Highest education level: Not on file  Occupational History   Not on file  Tobacco Use   Smoking status: Every Day    Current packs/day: 0.25    Types: Cigarettes   Smokeless tobacco: Never  Vaping Use   Vaping status: Never Used  Substance and Sexual Activity   Alcohol use: No   Drug use: Not Currently   Sexual activity: Never  Other Topics Concern   Not on file  Social History Narrative   Not on file    Social Drivers of Health   Financial Resource Strain: Not on file  Food Insecurity: No Food Insecurity (07/21/2023)   [facility]   Hunger Vital Sign    Within the past 12 months, you worried that your food would run out before you got the money to buy more.: Never true    Within the past 12 months, the food you bought just didn't last and you didn't have money to get more.: Never true  Transportation Needs: Unmet Transportation Needs (07/21/2023)   Received from [facility]   PRAPARE - Transportation    Lack of Transportation (Medical): Yes    Lack of Transportation (Non-Medical): No  Physical Activity: Not on file  Stress: Not on file  Social Connections: Not on file  Intimate Partner Violence: Not At Risk (01/12/2023)   Humiliation, Afraid, Rape, and Kick questionnaire    Fear of Current or Ex-Partner: No    Emotionally Abused: No    Physically Abused: No    Sexually Abused: No    Past Surgical History:  Procedure Laterality Date   A/V FISTULAGRAM Right 06/10/2022   Procedure: A/V Fistulagram;  Surgeon: Jama Cordella MATSU, MD;  Location: [facility] INVASIVE CV LAB;  Service: Cardiovascular;  Laterality: Right;   AV FISTULA PLACEMENT Right 12/26/2015   Procedure: ARTERIOVENOUS (AV) FISTULA CREATION ( RADIAL CEPHALIC );  Surgeon: Cordella MATSU Jama, MD;  Location: [facility] ORS;  Service: Vascular;  Laterality: Right;   FRACTURE SURGERY Left    screw in left wrist   LOWER EXTREMITY ANGIOGRAPHY Right 07/30/2021   Procedure: Lower Extremity Angiography;  Surgeon: Jama Cordella MATSU, MD;  Location: [facility] INVASIVE CV LAB;  Service: Cardiovascular;  Laterality: Right;   LOWER EXTREMITY ANGIOGRAPHY Right 01/13/2023   Procedure: Lower Extremity Angiography;  Surgeon: Jama Cordella MATSU, MD;  Location: [facility] INVASIVE CV LAB;  Service: Cardiovascular;  Laterality: Right;   LOWER EXTREMITY ANGIOGRAPHY Right 08/11/2023   Procedure: Lower Extremity Angiography;  Surgeon: Jama Cordella MATSU, MD;  Location: [facility] INVASIVE CV LAB;  Service: Cardiovascular;  Laterality: Right;   PERIPHERAL VASCULAR CATHETERIZATION Right 03/28/2016   Procedure: A/V Fistulagram;  Surgeon: Cordella MATSU Jama, MD;  Location: [facility]  INVASIVE CV LAB;  Service: Cardiovascular;  Laterality: Right;   PERIPHERAL VASCULAR CATHETERIZATION N/A 03/28/2016   Procedure: A/V Shunt Intervention;  Surgeon: Cordella KANDICE Shawl, MD;  Location: [facility] INVASIVE CV LAB;  Service: Cardiovascular;  Laterality: N/A;   VASCULAR SURGERY Left 07/2015   stent in left thigh, Location:[place]    Family History  Problem Relation Age of Onset   Hypertension Mother    Polycystic kidney disease Mother    Hypertension Sister     Allergies  Allergen Reactions   Iodinated Contrast Media Other (See Comments)    hypotension   Oxycodone -Acetaminophen  Itching       Latest Ref Rng & Units 02/14/2023    5:05 PM 01/14/2023    7:43 AM 01/13/2023    8:06 AM  CBC  WBC 4.0 - 10.5 K/uL 5.6  12.6  5.0   Hemoglobin 13.0 - 17.0 g/dL 85.8  85.8  86.3   Hematocrit 39.0 - 52.0 % 46.0  42.1  41.5   Platelets 150 - 400 K/uL 194  199  183       CMP     Component Value Date/Time   NA 134 (L) 02/14/2023 1705   K 4.8 08/11/2023 0724   CL 93 (L) 02/14/2023 1705   CO2 28 02/14/2023 1705   GLUCOSE 102 (H) 02/14/2023 1705   BUN 40 (H) 02/14/2023 1705   CREATININE 6.32 (H) 02/14/2023 1705   CALCIUM  10.2 02/14/2023 1705   PROT 7.4 07/02/2022 2217   ALBUMIN 3.8 01/14/2023 0743   AST 13 (L) 07/02/2022 2217   ALT 10 07/02/2022 2217   ALKPHOS 44 07/02/2022 2217   BILITOT 0.7 07/02/2022 2217   GFRNONAA 10 (L) 02/14/2023 1705     No results found.     Assessment & Plan:   1. Peripheral arterial disease with history of revascularization (HCC) (Primary) Recommend:  The patient is status post successful angiogram with intervention.  The patient reports that the claudication symptoms and leg pain has improved.   The patient denies  lifestyle limiting changes at this point in time.  No further invasive studies, angiography or surgery at this time. The patient should continue walking and begin a more formal exercise program.  The patient should continue antiplatelet therapy and aggressive treatment of the lipid abnormalities  Continued surveillance is indicated as atherosclerosis is likely to progress with time.    Patient should undergo noninvasive studies as ordered. The patient will follow up with me to review the studies.   2. ESRD (end stage renal disease) (HCC) Currently the patient reports no issues with his current dialysis access.  When he returns in 3 months he will undergo aneurysmal but is not showing any evidence of skin threatening.  I discussed with patient that at some point he will likely need a revision but not at this time.  3. Essential hypertension Continue antihypertensive medications as already ordered, these medications have been reviewed and there are no changes at this time.   Current Outpatient Medications on File Prior to Visit  Medication Sig Dispense Refill   aspirin  EC 81 MG tablet Take 1 tablet (81 mg total) by mouth daily. Swallow whole. 30 tablet 12   calcium  acetate (PHOSLO ) 667 MG tablet Take 2,001 mg by mouth 3 (three) times daily.     clopidogrel  (PLAVIX ) 75 MG tablet TAKE 1 TABLET BY MOUTH EVERY DAY 360 tablet 0   esomeprazole (NEXIUM) 40 MG capsule Take 40 mg by mouth daily.  furosemide  (LASIX ) 80 MG tablet Take 80 mg by mouth 2 (two) times daily.     HYDROcodone -acetaminophen  (NORCO) 10-325 MG tablet Take 1 tablet by mouth every 6 (six) hours.     lactulose (CHRONULAC) 10 GM/15ML solution SMARTSIG:15 Milliliter(s) By Mouth Twice Daily PRN     lidocaine  (LIDODERM ) 5 % Place 1 patch onto the skin daily. Remove & Discard patch within 12 hours or as directed by MD 30 patch 0   lidocaine -prilocaine  (EMLA ) cream SMARTSIG:1 Topical Daily     losartan (COZAAR) 50 MG tablet Take 50  mg by mouth daily.     midodrine  (PROAMATINE ) 10 MG tablet Take 1 tablet (10 mg total) by mouth daily as needed (Take 30 min prior to HD session). 30 tablet 0   morphine  (MSIR) 15 MG tablet Take 15 mg by mouth every 6 (six) hours.     naloxone  (NARCAN ) nasal spray 4 mg/0.1 mL 1 spray as directed.     RABEprazole (ACIPHEX) 20 MG tablet Take 20 mg by mouth daily.     simvastatin  (ZOCOR ) 10 MG tablet Take 10 mg by mouth daily.     VELTASSA  8.4 g packet Take 8.4 g by mouth daily.     amLODipine  (NORVASC ) 10 MG tablet Take 10 mg by mouth daily. (Patient not taking: Reported on 09/08/2023)     Nutritional Supplements (FEEDING SUPPLEMENT, NEPRO CARB STEADY,) LIQD Take 237 mLs by mouth as needed (missed meal during dialysis.). (Patient not taking: Reported on 09/08/2023) 30 mL 0   No current facility-administered medications on file prior to visit.    There are no Patient Instructions on file for this visit. No follow-ups on file.   Catheryne Deford E Rendi Mapel, NP

## 2023-09-09 LAB — VAS US ABI WITH/WO TBI
Left ABI: 1.02
Right ABI: 1.01

## 2023-10-03 LAB — COLOGUARD: COLOGUARD: POSITIVE — AB

## 2023-11-03 ENCOUNTER — Other Ambulatory Visit (INDEPENDENT_AMBULATORY_CARE_PROVIDER_SITE_OTHER): Payer: Self-pay | Admitting: Nurse Practitioner

## 2023-11-03 DIAGNOSIS — Z9889 Other specified postprocedural states: Secondary | ICD-10-CM

## 2023-11-03 DIAGNOSIS — N186 End stage renal disease: Secondary | ICD-10-CM

## 2023-11-03 DIAGNOSIS — M79603 Pain in arm, unspecified: Secondary | ICD-10-CM

## 2023-11-05 ENCOUNTER — Ambulatory Visit (INDEPENDENT_AMBULATORY_CARE_PROVIDER_SITE_OTHER)

## 2023-11-05 ENCOUNTER — Ambulatory Visit (INDEPENDENT_AMBULATORY_CARE_PROVIDER_SITE_OTHER): Admitting: Vascular Surgery

## 2023-11-05 ENCOUNTER — Other Ambulatory Visit (INDEPENDENT_AMBULATORY_CARE_PROVIDER_SITE_OTHER)

## 2023-11-05 ENCOUNTER — Encounter (INDEPENDENT_AMBULATORY_CARE_PROVIDER_SITE_OTHER): Payer: Self-pay | Admitting: Vascular Surgery

## 2023-11-05 VITALS — BP 174/75 | HR 75 | Ht 72.0 in | Wt 201.4 lb

## 2023-11-05 DIAGNOSIS — I25119 Atherosclerotic heart disease of native coronary artery with unspecified angina pectoris: Secondary | ICD-10-CM | POA: Diagnosis not present

## 2023-11-05 DIAGNOSIS — Z992 Dependence on renal dialysis: Secondary | ICD-10-CM

## 2023-11-05 DIAGNOSIS — M79603 Pain in arm, unspecified: Secondary | ICD-10-CM

## 2023-11-05 DIAGNOSIS — N186 End stage renal disease: Secondary | ICD-10-CM

## 2023-11-05 DIAGNOSIS — I70211 Atherosclerosis of native arteries of extremities with intermittent claudication, right leg: Secondary | ICD-10-CM | POA: Diagnosis not present

## 2023-11-05 DIAGNOSIS — Z9889 Other specified postprocedural states: Secondary | ICD-10-CM

## 2023-11-05 DIAGNOSIS — I739 Peripheral vascular disease, unspecified: Secondary | ICD-10-CM | POA: Diagnosis not present

## 2023-11-05 DIAGNOSIS — I10 Essential (primary) hypertension: Secondary | ICD-10-CM

## 2023-11-05 DIAGNOSIS — J45909 Unspecified asthma, uncomplicated: Secondary | ICD-10-CM

## 2023-11-08 ENCOUNTER — Encounter (INDEPENDENT_AMBULATORY_CARE_PROVIDER_SITE_OTHER): Payer: Self-pay | Admitting: Vascular Surgery

## 2023-11-08 NOTE — Progress Notes (Signed)
 MRN : [id number]  Oscar Moody. is a 53 y.o. ([birth year]) male who presents with chief complaint of check access.  History of Present Illness:   The patient returns today for evaluation of his dialysis access as well as his peripheral arterial disease.  He notes he has been having worsening claudication with some mild rest pain like symptoms.  He has some numbness of his lower extremities however I suspect this is more so related to underlying neuropathy.  Avulsion concerns of his dialysis access.  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  He also notes some worsening bleeding as well.   Today noninvasive studies show that ABI Rt=1.06 (TBI=0.72) and Lt=1.27 (TBI=0.96) (previous ABI's Rt=1.01 (TBI=0.62) and Lt=1.02 (TBI=0.41).  He has biphasic tibial waveforms bilaterally.   Duplex ultrasound of the AV access done today shows a flow volume of 2574 for his fistula.  There appears to be improvement in the stenotic area noted at the antecubital fossa area.  Current Meds  Medication Sig   amLODipine  (NORVASC ) 10 MG tablet Take 10 mg by mouth daily.   aspirin  EC 81 MG tablet Take 1 tablet (81 mg total) by mouth daily. Swallow whole.   calcium  acetate (PHOSLO ) 667 MG tablet Take 2,001 mg by mouth 3 (three) times daily.   clopidogrel  (PLAVIX ) 75 MG tablet TAKE 1 TABLET BY MOUTH EVERY DAY   esomeprazole (NEXIUM) 40 MG capsule Take 40 mg by mouth daily.   furosemide  (LASIX ) 80 MG tablet Take 80 mg by mouth 2 (two) times daily.   HYDROcodone -acetaminophen  (NORCO) 10-325 MG tablet Take 1 tablet by mouth every 6 (six) hours.   lactulose (CHRONULAC) 10 GM/15ML solution SMARTSIG:15 Milliliter(s) By Mouth Twice Daily PRN   lidocaine  (LIDODERM ) 5 % Place 1 patch onto the skin daily. Remove & Discard patch within 12 hours or as directed by MD   lidocaine -prilocaine  (EMLA ) cream SMARTSIG:1 Topical Daily    losartan (COZAAR) 50 MG tablet Take 50 mg by mouth daily.   midodrine  (PROAMATINE ) 10 MG tablet Take 1 tablet (10 mg total) by mouth daily as needed (Take 30 min prior to HD session).   morphine  (MSIR) 15 MG tablet Take 15 mg by mouth every 6 (six) hours.   naloxone  (NARCAN ) nasal spray 4 mg/0.1 mL 1 spray as directed.   Nutritional Supplements (FEEDING SUPPLEMENT, NEPRO CARB STEADY,) LIQD Take 237 mLs by mouth as needed (missed meal during dialysis.).   RABEprazole (ACIPHEX) 20 MG tablet Take 20 mg by mouth daily.   simvastatin  (ZOCOR ) 10 MG tablet Take 10 mg by mouth daily.   VELTASSA  8.4 g packet Take 8.4 g by mouth daily.    Past Medical History:  Diagnosis Date   Anxiety    Arthritis    Asthma    childhood asthma   Chronic kidney disease    Current every day smoker    DVT (deep venous thrombosis) (HCC)    Dyspnea    with exertion   GERD (gastroesophageal reflux disease)    Headache    Hypercholesteremia    Hypertension    Peripheral vascular disease [facility])     Past Surgical History:  Procedure Laterality Date   A/V FISTULAGRAM Right 06/10/2022   Procedure: A/V Fistulagram;  Surgeon: Jama Cordella MATSU, MD;  Location: [facility] INVASIVE CV  LAB;  Service: Cardiovascular;  Laterality: Right;   AV FISTULA PLACEMENT Right 12/26/2015   Procedure: ARTERIOVENOUS (AV) FISTULA CREATION ( RADIAL CEPHALIC );  Surgeon: Cordella KANDICE Shawl, MD;  Location: [facility] ORS;  Service: Vascular;  Laterality: Right;   FRACTURE SURGERY Left    screw in left wrist   LOWER EXTREMITY ANGIOGRAPHY Right 07/30/2021   Procedure: Lower Extremity Angiography;  Surgeon: Shawl Cordella KANDICE, MD;  Location: [facility] INVASIVE CV LAB;  Service: Cardiovascular;  Laterality: Right;   LOWER EXTREMITY ANGIOGRAPHY Right 01/13/2023   Procedure: Lower Extremity Angiography;  Surgeon: Shawl Cordella KANDICE, MD;  Location: [facility] INV[facility] CV LAB;  Service: Cardiovascular;  Laterality: Right;   LOWER EXTREMITY ANGIOGRAPHY Right 08/11/2023    Procedure: Lower Extremity Angiography;  Surgeon: Shawl Cordella KANDICE, MD;  Location: [facility] INVASIVE CV LAB;  Service: Cardiovascular;  Laterality: Right;   PERIPHERAL VASCULAR CATHETERIZATION Right 03/28/2016   Procedure: A/V Fistulagram;  Surgeon: Cordella KANDICE Shawl, MD;  Location: [facility] INVASIVE CV LAB;  Service: Cardiovascular;  Laterality: Right;   PERIPHERAL VASCULAR CATHETERIZATION N/A 03/28/2016   Procedure: A/V Shunt Intervention;  Surgeon: Cordella KANDICE Shawl, MD;  Location: [facility] INVASIVE CV LAB;  Service: Cardiovascular;  Laterality: N/A;   VASCULAR SURGERY Left 07/2015   stent in left thigh, Location:[place]    Social History Social History   Tobacco Use   Smoking status: Every Day    Current packs/day: 0.25    Types: Cigarettes   Smokeless tobacco: Never  Vaping Use   Vaping status: Never Used  Substance Use Topics   Alcohol use: No   Drug use: Not Currently    Family History Family History  Problem Relation Age of Onset   Hypertension Mother    Polycystic kidney disease Mother    Hypertension Sister     Allergies  Allergen Reactions   Iodinated Contrast Media Other (See Comments)    hypotension   Oxycodone -Acetaminophen  Itching     REVIEW OF SYSTEMS (Negative unless checked)  Constitutional: [] Weight loss  [] Fever  [] Chills Cardiac: [] Chest pain   [] Chest pressure   [] Palpitations   [] Shortness of breath when laying flat   [] Shortness of breath with exertion. Vascular:  [] Pain in legs with walking   [] Pain in legs at rest  [] History of DVT   [] Phlebitis   [] Swelling in legs   [] Varicose veins   [] Non-healing ulcers Pulmonary:   [] Uses home oxygen   [] Productive cough   [] Hemoptysis   [] Wheeze  [] COPD   [] Asthma Neurologic:  [] Dizziness   [] Seizures   [] History of stroke   [] History of TIA  [] Aphasia   [] Vissual changes   [] Weakness or numbness in arm   [] Weakness or numbness in leg Musculoskeletal:   [] Joint swelling   [] Joint pain   [] Low back  pain Hematologic:  [] Easy bruising  [] Easy bleeding   [] Hypercoagulable state   [] Anemic Gastrointestinal:  [] Diarrhea   [] Vomiting  [] Gastroesophageal reflux/heartburn   [] Difficulty swallowing. Genitourinary:  [x] Chronic kidney disease   [] Difficult urination  [] Frequent urination   [] Blood in urine Skin:  [] Rashes   [] Ulcers  Psychological:  [] History of anxiety   []  History of major depression.  Physical Examination  Vitals:   11/05/23 1604  BP: (!) 174/75  Pulse: 75  Weight: 201 lb 6.4 oz (91.4 kg)  Height: 6' (1.829 m)   Body mass index is 27.31 kg/m. Gen: WD/WN, NAD Head: [place]/AT, No temporalis wasting.  Ear/Nose/Throat: Hearing grossly intact, nares w/o erythema or drainage Eyes:  PER, EOMI, sclera nonicteric.  Neck: Supple, no gross masses or lesions.  No JVD.  Pulmonary:  Good air movement, no audible wheezing, no use of accessory muscles.  Cardiac: RRR, precordium non-hyperdynamic. Vascular:   AV access good thrill good bruit moderate aneurysmal changes Vessel Right Left  Radial Palpable Palpable  Brachial Palpable Palpable  DP Not palpable Not palpable  PT Not palpable Not palpable  Gastrointestinal: soft, non-distended. No guarding/no peritoneal signs.  Musculoskeletal: M/S 5/5 throughout.  No deformity.  Neurologic: CN 2-12 intact. Pain and light touch intact in extremities.  Symmetrical.  Speech is fluent. Motor exam as listed above. Psychiatric: Judgment intact, Mood & affect appropriate for pt's clinical situation. Dermatologic: No rashes or ulcers noted.  No changes consistent with cellulitis.   CBC Lab Results  Component Value Date   WBC 5.6 02/14/2023   HGB 14.1 02/14/2023   HCT 46.0 02/14/2023   MCV 94.1 02/14/2023   PLT 194 02/14/2023    BMET    Component Value Date/Time   NA 134 (L) 02/14/2023 1705   K 4.8 08/11/2023 0724   CL 93 (L) 02/14/2023 1705   CO2 28 02/14/2023 1705   GLUCOSE 102 (H) 02/14/2023 1705   BUN 40 (H) 02/14/2023 1705    CREATININE 6.32 (H) 02/14/2023 1705   CALCIUM  10.2 02/14/2023 1705   GFRNONAA 10 (L) 02/14/2023 1705   GFRAA 12 (L) 03/28/2016 0941   CrCl cannot be calculated (Patient's most recent lab result is older than the maximum 21 days allowed.).  COAG Lab Results  Component Value Date   INR 1.1 01/11/2023   INR 1.06 12/11/2015    Radiology No results found.   Assessment/Plan 1. ESRD on dialysis [facility]) (Primary) Recommend:  The patient is doing well and currently has adequate dialysis access. The patient's dialysis center is not reporting any access issues. Flow pattern is stable when compared to the prior ultrasound.  The patient should have a duplex ultrasound of the dialysis access in 6 months. The patient will follow-up with me in the office after each ultrasound   - VAS US  DUPLEX DIALYSIS ACCESS (AVF, AVG); Future  2. Atherosclerosis of native artery of right lower extremity with intermittent claudication (HCC)  Recommend:  The patient has evidence of atherosclerosis of the lower extremities with claudication.  The patient does not voice lifestyle limiting changes at this point in time.  Noninvasive studies do not suggest clinically significant change.  No invasive studies, angiography or surgery at this time The patient should continue walking and begin a more formal exercise program.  The patient should continue antiplatelet therapy and aggressive treatment of the lipid abnormalities  No changes in the patient's medications at this time  Continued surveillance is indicated as atherosclerosis is likely to progress with time.    The patient will continue follow up with noninvasive studies as ordered.   3. Coronary artery disease involving native coronary artery of native heart with angina pectoris (HCC) Continue cardiac and antihypertensive medications as already ordered and reviewed, no changes at this time.  Continue statin as ordered and reviewed, no changes at this  time  Nitrates PRN for chest pain  4. Essential hypertension Continue antihypertensive medications as already ordered, these medications have been reviewed and there are no changes at this time.  5. Uncomplicated asthma, unspecified asthma severity, unspecified whether persistent Continue pulmonary medications and aerosols as already ordered, these medications have been reviewed and there are no changes at this time.  Cordella Shawl, MD  11/08/2023 1:00 PM

## 2023-11-09 LAB — VAS US ABI WITH/WO TBI
Left ABI: 1.27
Right ABI: 1.06

## 2023-12-15 ENCOUNTER — Ambulatory Visit (INDEPENDENT_AMBULATORY_CARE_PROVIDER_SITE_OTHER): Admitting: Nurse Practitioner

## 2023-12-15 ENCOUNTER — Encounter (INDEPENDENT_AMBULATORY_CARE_PROVIDER_SITE_OTHER)

## 2023-12-16 NOTE — Progress Notes (Signed)
 New Eval for Kidney transplant:  Pt alert and oriented, accompanied by _self__ in pre transplant clinic.  Pt placed in exam room. Functional status: _13 (04/30/23)_  Expectations of today's appointments explained.   Confirmation that pt has completed imaging (CXR & CT), lab work, SW interview.  Discussed consent form for kidneys with KDPI of 85% or greater with pt. Form placed in chart.  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 Coronary artery disease 03/21/2022  . Depression 03/21/2022  . ESRD on hemodialysis    (CMD) 03/21/2022  . Dyslipidemia 05/10/2018  . Anxiety state 04/30/2016  . ESRD on dialysis 03/26/2016  . PAD (peripheral artery disease) 05/15/2014  . Polycystic kidney 02/28/2013  . Flank pain 11/01/2012  . Polycystic kidney disease 11/01/2012  . Renal insufficiency 11/01/2012  . History of polycystic kidney disease 10/19/2012  . Essential hypertension 05/05/2012  . Gastroesophageal reflux disease without esophagitis 05/05/2012  . Frequency 08/06/2011  . Dysuria 08/06/2011  . Urinary frequency 03/26/2011  . History of adult polycystic kidney disease 03/26/2011    Surgical Hx: Past Surgical History: Procedure Laterality Date  . WRIST SURGERY     Procedure: WRIST SURGERY     Nutritional screening assessment: Height/weight/BMI obtained today (BMI   27.51   ) and reviewed with pt. Pt does not meet  criteria for referral to dietician per most recent, Nutrition Screening Criteria, policy.  Pt will not be referred to dietician.   The following topics from education class were reviewed: Communication-Importance of notifying coordinator of changes in contact information (telephone number, address). Also, pt is to notify coordinator if leaving town, if health status changes and/or if insurance coverage changes. Importance of keeping appointments stressed. Caregiver-Role explained. Pt identified _WIFE__ as primary caregiver. Living Donors-Pt does not have living donors at this time. Packets given, LD email provided. Medication-Importance of immunosuppressant medication stressed. Consequences of not taking the medications, such as rejection, explained to pt. Pt verbalized understanding. Questions/concerns addressed.  Post txp care-Importance of follow-up care stressed. Pt informed required care will be performed in the clinic following transplant. Frequency of appointments explained, noting care will be referred back to their nephrologist after approx. 9-12 months.  Pt receptive to education with no questions/concerns.
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
## 2023-12-23 NOTE — Progress Notes (Signed)
 Notes for Selection Committee: RD received notification from Abdominal Organ Transplant team regarding potential Living Donor/ Transplant Candidate.   Per chart: Estimated body mass index is 27.51 kg/m as calculated from the following:   Height as of 12/17/23: 1.829 m (6').   Weight as of 12/17/23: 92 kg (202 lb 13.2 oz). Lab Results  Component Value Date   HGBA1C 5.1 08/04/2023    No nutrition issues identified per chart review (based on updated 2019 Pre-Transplant/Living Donor screening guidelines). Formal pre-transplant nutrition evaluation is not indicated at this time.   RD will follow up per Transplant team protocol.  Oscar Kale, MS, RD, LDN, CNSC Clinical Outpatient Dietitian

## 2024-02-04 ENCOUNTER — Other Ambulatory Visit (INDEPENDENT_AMBULATORY_CARE_PROVIDER_SITE_OTHER): Payer: Self-pay | Admitting: Vascular Surgery

## 2024-05-05 ENCOUNTER — Encounter (INDEPENDENT_AMBULATORY_CARE_PROVIDER_SITE_OTHER)

## 2024-05-05 ENCOUNTER — Ambulatory Visit (INDEPENDENT_AMBULATORY_CARE_PROVIDER_SITE_OTHER): Admitting: Vascular Surgery
